# Patient Record
Sex: Female | Born: 1950 | Race: Black or African American | Hispanic: No | Smoking: Former smoker
Health system: Southern US, Community
[De-identification: ages and names within clinical notes are randomized; demographics above are authoritative.]

## PROBLEM LIST (undated history)

## (undated) DIAGNOSIS — E785 Hyperlipidemia, unspecified: Secondary | ICD-10-CM

## (undated) DIAGNOSIS — I10 Essential (primary) hypertension: Secondary | ICD-10-CM

## (undated) DIAGNOSIS — S0300XA Dislocation of jaw, unspecified side, initial encounter: Secondary | ICD-10-CM

## (undated) HISTORY — DX: Hyperlipidemia, unspecified: E78.5

## (undated) HISTORY — PX: CHOLECYSTECTOMY: SHX55

## (undated) HISTORY — PX: ABDOMINAL HYSTERECTOMY: SHX81

---

## 2002-01-18 ENCOUNTER — Emergency Department (HOSPITAL_COMMUNITY): Admission: EM | Admit: 2002-01-18 | Discharge: 2002-01-18 | Payer: Self-pay | Admitting: *Deleted

## 2002-03-17 ENCOUNTER — Inpatient Hospital Stay (HOSPITAL_COMMUNITY): Admission: EM | Admit: 2002-03-17 | Discharge: 2002-03-22 | Payer: Self-pay | Admitting: Emergency Medicine

## 2002-03-17 ENCOUNTER — Encounter: Payer: Self-pay | Admitting: Emergency Medicine

## 2002-03-18 ENCOUNTER — Encounter: Payer: Self-pay | Admitting: General Surgery

## 2011-06-29 ENCOUNTER — Encounter (HOSPITAL_COMMUNITY): Payer: Self-pay | Admitting: *Deleted

## 2011-06-29 ENCOUNTER — Emergency Department (HOSPITAL_COMMUNITY)
Admission: EM | Admit: 2011-06-29 | Discharge: 2011-06-29 | Disposition: A | Discharge status: Home / Self Care | Payer: Self-pay | Attending: Emergency Medicine | Admitting: Emergency Medicine

## 2011-06-29 DIAGNOSIS — M2669 Other specified disorders of temporomandibular joint: Secondary | ICD-10-CM

## 2011-06-29 DIAGNOSIS — M26609 Unspecified temporomandibular joint disorder, unspecified side: Secondary | ICD-10-CM | POA: Insufficient documentation

## 2011-06-29 HISTORY — DX: Dislocation of jaw, unspecified side, initial encounter: S03.00XA

## 2011-06-29 MED ORDER — DIAZEPAM 5 MG PO TABS: 5.0000 mg | ORAL_TABLET | Freq: Four times a day (QID) | ORAL | Status: AC | PRN

## 2011-06-29 MED ORDER — DIAZEPAM 5 MG PO TABS
ORAL_TABLET | ORAL | Status: AC
  Filled 2011-06-29: qty 4

## 2011-06-29 NOTE — ED Provider Notes (Signed)
History  This chart was scribed for Hurman Horn, MD by Cherlynn Perches. The patient was seen in room APA18/APA18. Patient's care was started at 1649.  CSN: [id number]  Arrival date & time 06/29/11  1649   First MD Initiated Contact with Patient 06/29/11 1944      Chief Complaint  Patient presents with  . Jaw Pain    (Consider location/radiation/quality/duration/timing/severity/associated sxs/prior treatment) HPI  Natasha Vazquez is a 61 y.o. 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 Pt reports that she has dislocated the TMJ joint 2 times previously. Pt states that she has been taking flexeril for 3 days with minimal relief. Shortly before being seen by physician, pt reports that the stiffness in her jaw was relieved. Pt denies smoking and alcohol use.No fever, trauma, tooth pain, neck pain, headache.   Past Medical History  Diagnosis Date  . TMJ (dislocation of temporomandibular joint)     Past Surgical History  Procedure Date  . Cholecystectomy   . Abdominal hysterectomy     No family history on file.  History  Substance Use Topics  . Smoking status: Never Smoker   . Smokeless tobacco: Not on file  . Alcohol Use: No    OB History    Grav Para Term Preterm Abortions TAB SAB Ect Mult Living                  Review of Systems  Constitutional: Negative for fever.       10 Systems reviewed and are negative for acute change except as noted in the HPI.  HENT: Negative for congestion.        TMJ pain, inability to close jaw, swelling of jaw  Eyes: Negative for discharge and redness.  Respiratory: Negative for cough and shortness of breath.   Cardiovascular: Negative for chest pain.  Gastrointestinal: Negative for vomiting and abdominal pain.  Musculoskeletal: Negative for back pain.  Skin: Negative for rash.  Neurological: Negative  for syncope, numbness and headaches.  Psychiatric/Behavioral:       No behavior change.    Allergies  Aspirin and Penicillins  Home Medications   Current Outpatient Rx  Name Route Sig Dispense Refill  . CYCLOBENZAPRINE HCL 10 MG PO TABS Oral Take 10 mg by mouth 2 (two) times daily as needed. Muscle spasm    . DIAZEPAM 5 MG PO TABS Oral Take 1 tablet (5 mg total) by mouth every 6 (six) hours as needed (spasms). 4 tablet 0    BP 149/83  Pulse 78  Temp 98.1 F (36.7 C)  Resp 20  Ht 5\' 9"  (1.753 m)  Wt 170 lb (77.111 kg)  BMI 25.10 kg/m2  SpO2 100%  Physical Exam  Nursing note and vitals reviewed. Constitutional:       Awake, alert, nontoxic appearance.  HENT:  Head: Atraumatic.  Mouth/Throat: Oropharynx is clear and moist.       Normal jaw occulusion, dentition intact, mild bilateral TMJ tenderness  Eyes: Right eye exhibits no discharge. Left eye exhibits no discharge.  Neck: Neck supple.  Cardiovascular: Normal rate, regular rhythm and normal heart sounds.   No murmur heard. Pulmonary/Chest: Effort normal and breath sounds normal. No respiratory distress. She has no wheezes. She has no rales. She exhibits no tenderness.  Abdominal: Soft. There is no tenderness. There is no rebound.  Musculoskeletal:  She exhibits no tenderness.       Baseline ROM, no obvious new focal weakness.  Neurological:       Mental status and motor strength appears baseline for patient and situation.  Skin: No rash noted.  Psychiatric: She has a normal mood and affect.    ED Course  Procedures (including critical care time)  DIAGNOSTIC STUDIES: Oxygen Saturation is 100% on room air, normal by my interpretation.    COORDINATION OF CARE: 8:00PM - Will prescribe muscle relaxer. Advised pt to follow up with dentist. Pt agrees with plan.    Labs Reviewed - No data to display No results found.   1. TMJ locking       MDM  I personally performed the services described in this  documentation, which was scribed in my presence. The recorded information has been reviewed and considered.  Pt stable in ED with no significant deterioration in condition.Patient / Family / Caregiver informed of clinical course, understand medical decision-making process, and agree with plan.I doubt any other EMC precluding discharge at this time including, but not necessarily limited to the following:SBI, dislocated jaw.  Hurman Horn, MD 06/30/11 [phone]

## 2011-06-29 NOTE — Discharge Instructions (Signed)
No yawning, chewing, laughing, or wide mouth opening. Liquid or soft diet only with no chewing until recheck.  Return sooner if you develop fever, inability to close teeth together, recurrent severe pain, difficulty breathing or swallowing, neck swelling, or other concerns.

## 2011-06-29 NOTE — ED Notes (Signed)
Pt with swelling to right jaw, states this is third time

## 2011-06-29 NOTE — ED Notes (Signed)
Jaw locked, history of TMJ

## 2014-03-01 ENCOUNTER — Other Ambulatory Visit (HOSPITAL_COMMUNITY): Payer: Self-pay | Admitting: Family Medicine

## 2014-03-01 DIAGNOSIS — R030 Elevated blood-pressure reading, without diagnosis of hypertension: Secondary | ICD-10-CM | POA: Diagnosis not present

## 2014-03-01 DIAGNOSIS — Z1231 Encounter for screening mammogram for malignant neoplasm of breast: Secondary | ICD-10-CM

## 2014-03-01 DIAGNOSIS — Z13220 Encounter for screening for lipoid disorders: Secondary | ICD-10-CM | POA: Diagnosis not present

## 2014-03-01 DIAGNOSIS — Z136 Encounter for screening for cardiovascular disorders: Secondary | ICD-10-CM | POA: Diagnosis not present

## 2014-03-01 DIAGNOSIS — Z0000 Encounter for general adult medical examination without abnormal findings: Secondary | ICD-10-CM | POA: Diagnosis not present

## 2014-03-01 DIAGNOSIS — Z23 Encounter for immunization: Secondary | ICD-10-CM | POA: Diagnosis not present

## 2014-03-01 DIAGNOSIS — H547 Unspecified visual loss: Secondary | ICD-10-CM | POA: Diagnosis not present

## 2014-03-01 DIAGNOSIS — D179 Benign lipomatous neoplasm, unspecified: Secondary | ICD-10-CM | POA: Diagnosis not present

## 2014-03-01 DIAGNOSIS — Z1389 Encounter for screening for other disorder: Secondary | ICD-10-CM | POA: Diagnosis not present

## 2014-03-15 ENCOUNTER — Ambulatory Visit (HOSPITAL_COMMUNITY)
Admission: RE | Admit: 2014-03-15 | Discharge: 2014-03-15 | Disposition: A | Discharge status: Home / Self Care | Payer: Medicare Other | Source: Ambulatory Visit | Attending: Family Medicine | Admitting: Family Medicine

## 2014-03-15 DIAGNOSIS — Z1231 Encounter for screening mammogram for malignant neoplasm of breast: Secondary | ICD-10-CM | POA: Diagnosis not present

## 2014-05-10 DIAGNOSIS — H3532 Exudative age-related macular degeneration: Secondary | ICD-10-CM | POA: Diagnosis not present

## 2014-05-10 DIAGNOSIS — H524 Presbyopia: Secondary | ICD-10-CM | POA: Diagnosis not present

## 2014-05-10 DIAGNOSIS — H2513 Age-related nuclear cataract, bilateral: Secondary | ICD-10-CM | POA: Diagnosis not present

## 2014-05-10 DIAGNOSIS — H3531 Nonexudative age-related macular degeneration: Secondary | ICD-10-CM | POA: Diagnosis not present

## 2014-06-02 DIAGNOSIS — H43812 Vitreous degeneration, left eye: Secondary | ICD-10-CM | POA: Diagnosis not present

## 2014-06-02 DIAGNOSIS — H3531 Nonexudative age-related macular degeneration: Secondary | ICD-10-CM | POA: Diagnosis not present

## 2014-08-31 DIAGNOSIS — H01005 Unspecified blepharitis left lower eyelid: Secondary | ICD-10-CM | POA: Diagnosis not present

## 2014-08-31 DIAGNOSIS — H01004 Unspecified blepharitis left upper eyelid: Secondary | ICD-10-CM | POA: Diagnosis not present

## 2014-08-31 DIAGNOSIS — H01001 Unspecified blepharitis right upper eyelid: Secondary | ICD-10-CM | POA: Diagnosis not present

## 2014-08-31 DIAGNOSIS — H25813 Combined forms of age-related cataract, bilateral: Secondary | ICD-10-CM | POA: Diagnosis not present

## 2014-08-31 DIAGNOSIS — H01002 Unspecified blepharitis right lower eyelid: Secondary | ICD-10-CM | POA: Diagnosis not present

## 2014-08-31 DIAGNOSIS — H5332 Fusion with defective stereopsis: Secondary | ICD-10-CM | POA: Diagnosis not present

## 2014-08-31 DIAGNOSIS — H524 Presbyopia: Secondary | ICD-10-CM | POA: Diagnosis not present

## 2015-03-06 ENCOUNTER — Other Ambulatory Visit (HOSPITAL_COMMUNITY): Payer: Self-pay | Admitting: Family Medicine

## 2015-03-06 DIAGNOSIS — Z1231 Encounter for screening mammogram for malignant neoplasm of breast: Secondary | ICD-10-CM

## 2015-03-26 ENCOUNTER — Ambulatory Visit (HOSPITAL_COMMUNITY)
Admission: RE | Admit: 2015-03-26 | Discharge: 2015-03-26 | Disposition: A | Discharge status: Home / Self Care | Payer: Medicare Other | Source: Ambulatory Visit | Attending: Family Medicine | Admitting: Family Medicine

## 2015-03-26 DIAGNOSIS — Z1231 Encounter for screening mammogram for malignant neoplasm of breast: Secondary | ICD-10-CM | POA: Insufficient documentation

## 2016-02-25 ENCOUNTER — Other Ambulatory Visit (HOSPITAL_COMMUNITY): Payer: Self-pay | Admitting: Family Medicine

## 2016-02-25 ENCOUNTER — Ambulatory Visit (HOSPITAL_COMMUNITY)
Admission: RE | Admit: 2016-02-25 | Discharge: 2016-02-25 | Disposition: A | Discharge status: Home / Self Care | Payer: Medicare Other | Source: Ambulatory Visit | Attending: Family Medicine | Admitting: Family Medicine

## 2016-02-25 DIAGNOSIS — Z1231 Encounter for screening mammogram for malignant neoplasm of breast: Secondary | ICD-10-CM

## 2016-04-02 ENCOUNTER — Ambulatory Visit (HOSPITAL_COMMUNITY)
Admission: RE | Admit: 2016-04-02 | Discharge: 2016-04-02 | Disposition: A | Discharge status: Home / Self Care | Payer: Medicare Other | Source: Ambulatory Visit | Attending: Family Medicine | Admitting: Family Medicine

## 2016-04-02 DIAGNOSIS — Z1231 Encounter for screening mammogram for malignant neoplasm of breast: Secondary | ICD-10-CM | POA: Diagnosis present

## 2016-08-12 ENCOUNTER — Encounter (HOSPITAL_COMMUNITY): Payer: Self-pay | Admitting: Emergency Medicine

## 2016-08-12 ENCOUNTER — Emergency Department (HOSPITAL_COMMUNITY)
Admission: EM | Admit: 2016-08-12 | Discharge: 2016-08-12 | Disposition: A | Discharge status: Home / Self Care | Payer: Medicare Other | Attending: Emergency Medicine | Admitting: Emergency Medicine

## 2016-08-12 ENCOUNTER — Emergency Department (HOSPITAL_COMMUNITY): Payer: Medicare Other

## 2016-08-12 DIAGNOSIS — R42 Dizziness and giddiness: Secondary | ICD-10-CM | POA: Insufficient documentation

## 2016-08-12 DIAGNOSIS — I10 Essential (primary) hypertension: Secondary | ICD-10-CM | POA: Diagnosis not present

## 2016-08-12 HISTORY — DX: Essential (primary) hypertension: I10

## 2016-08-12 LAB — COMPREHENSIVE METABOLIC PANEL
ALBUMIN: 4.1 g/dL (ref 3.5–5.0)
ALK PHOS: 87 U/L (ref 38–126)
ALT: 27 U/L (ref 14–54)
ANION GAP: 7 (ref 5–15)
AST: 36 U/L (ref 15–41)
BUN: 19 mg/dL (ref 6–20)
CO2: 24 mmol/L (ref 22–32)
CREATININE: 0.68 mg/dL (ref 0.44–1.00)
Calcium: 9.2 mg/dL (ref 8.9–10.3)
Chloride: 108 mmol/L (ref 101–111)
GFR calc Af Amer: >60 mL/min (ref >60)
GFR calc non Af Amer: >60 mL/min (ref >60)
GLUCOSE: 134 mg/dL — AB (ref 65–99)
Potassium: 4.2 mmol/L (ref 3.5–5.1)
SODIUM: 139 mmol/L (ref 135–145)
Total Bilirubin: 0.5 mg/dL (ref 0.3–1.2)
Total Protein: 7.4 g/dL (ref 6.5–8.1)

## 2016-08-12 LAB — CBC WITH DIFFERENTIAL/PLATELET
BASOS ABS: 0 10*3/uL (ref 0.0–0.1)
BASOS PCT: 0 %
Eosinophils Absolute: 0 10*3/uL (ref 0.0–0.7)
Eosinophils Relative: 0 %
HCT: 36.1 % (ref 36.0–46.0)
Hemoglobin: 12.5 g/dL (ref 12.0–15.0)
Lymphocytes Relative: 11 %
Lymphs Abs: 0.9 10*3/uL (ref 0.7–4.0)
MCH: 32.6 pg (ref 26.0–34.0)
MCHC: 34.6 g/dL (ref 30.0–36.0)
MCV: 94.3 fL (ref 78.0–100.0)
Monocytes Absolute: 0.2 10*3/uL (ref 0.1–1.0)
Monocytes Relative: 2 %
NEUTROS PCT: 87 %
Neutro Abs: 7.2 10*3/uL (ref 1.7–7.7)
Platelets: 295 10*3/uL (ref 150–400)
RBC: 3.83 MIL/uL — AB (ref 3.87–5.11)
RDW: 13 % (ref 11.5–15.5)
WBC: 8.2 10*3/uL (ref 4.0–10.5)

## 2016-08-12 MED ORDER — PROMETHAZINE HCL 25 MG/ML IJ SOLN
12.5000 mg | Freq: Once | INTRAMUSCULAR | Status: AC
  Administered 2016-08-12: 12.5 mg via INTRAVENOUS
  Filled 2016-08-12: qty 1

## 2016-08-12 MED ORDER — ONDANSETRON 4 MG PO TBDP: ORAL_TABLET | ORAL | 0 refills | Status: DC

## 2016-08-12 MED ORDER — ONDANSETRON HCL 4 MG/2ML IJ SOLN
4.0000 mg | Freq: Once | INTRAMUSCULAR | Status: AC
  Administered 2016-08-12: 4 mg via INTRAVENOUS
  Filled 2016-08-12: qty 2

## 2016-08-12 MED ORDER — DIAZEPAM 5 MG PO TABS
5.0000 mg | ORAL_TABLET | Freq: Once | ORAL | Status: AC
  Administered 2016-08-12: 5 mg via ORAL
  Filled 2016-08-12: qty 1

## 2016-08-12 MED ORDER — MECLIZINE HCL 12.5 MG PO TABS
25.0000 mg | ORAL_TABLET | Freq: Once | ORAL | Status: AC
  Administered 2016-08-12: 25 mg via ORAL
  Filled 2016-08-12: qty 2

## 2016-08-12 MED ORDER — SODIUM CHLORIDE 0.9 % IV BOLUS (SEPSIS)
1000.0000 mL | Freq: Once | INTRAVENOUS | Status: AC
  Administered 2016-08-12: 1000 mL via INTRAVENOUS

## 2016-08-12 MED ORDER — MECLIZINE HCL 25 MG PO TABS: 25.0000 mg | ORAL_TABLET | Freq: Three times a day (TID) | ORAL | 0 refills | Status: DC | PRN

## 2016-08-12 NOTE — ED Provider Notes (Signed)
The patient's dizziness improved somewhat treatment. MRI showed possible very small aneurysm. I spoke with neurosurgery and it was recommended that they set her up for a arteriogram. Neurosurgery will call the patient and tell them when the arteriogram will be done. Even if she has this 2.5 mm aneurysm the treatment will be observation with neurosurgery   Milton Ferguson, MD 08/12/16 1114

## 2016-08-12 NOTE — ED Provider Notes (Signed)
[facility] DEPT Provider Note   CSN: [id number] Arrival date & time: 08/12/16  0351     History   Chief Complaint Chief Complaint  Patient presents with  . Dizziness    HPI Natasha Vazquez is a 66 y.o. female.  HPI  This is a 66 year old female with a history of hypertension who presents with dizziness. Patient states that she woke up this morning at 2 AM to go to the bathroom. When she laid back down she states that the room started to spin. She had 2 episodes of nonbilious, nonbloody emesis. Room spinning is worse with position changes and head movement. Denies weakness, numbness, tingling, vision changes. No other focal deficits. No history of vertigo. No recent illnesses or upper respiratory symptoms.  Past Medical History:  Diagnosis Date  . Hypertension   . TMJ (dislocation of temporomandibular joint)     There are no active problems to display for this patient.   Past Surgical History:  Procedure Laterality Date  . ABDOMINAL HYSTERECTOMY    . CHOLECYSTECTOMY      OB History    No data available       Home Medications    Prior to Admission medications   Medication Sig Start Date End Date Taking? Authorizing Provider  cyclobenzaprine (FLEXERIL) 10 MG tablet Take 10 mg by mouth 2 (two) times daily as needed. Muscle spasm    [provider]    Family History History reviewed. No pertinent family history.  Social History Social History  Substance Use Topics  . Smoking status: Never Smoker  . Smokeless tobacco: Never Used  . Alcohol use No     Allergies   Aspirin and Penicillins   Review of Systems Review of Systems  Constitutional: Negative for fever.  Respiratory: Negative for shortness of breath.   Cardiovascular: Negative for chest pain.  Gastrointestinal: Positive for abdominal pain, nausea and vomiting.  Neurological: Positive for dizziness and light-headedness. Negative for speech difficulty, weakness and numbness.  All  other systems reviewed and are negative.    Physical Exam Updated Vital Signs BP (!) 162/79   Pulse 69   Temp 98.1 F (36.7 C) (Oral)   Resp 16   Ht 5\' 7"  (1.702 m)   Wt 75.8 kg (167 lb)   SpO2 94%   BMI 26.16 kg/m   Physical Exam  Constitutional: She is oriented to person, place, and time.  Uncomfortable appearing, eyes closed, no acute distress  HENT:  Head: Normocephalic and atraumatic.  Eyes: Pupils are equal, round, and reactive to light.  Horizontal nystagmus noted, difficulty with extraocular movements secondary to severe nausea  Cardiovascular: Normal rate, regular rhythm and normal heart sounds.   No murmur heard. Pulmonary/Chest: Effort normal and breath sounds normal. No respiratory distress. She has no wheezes.  Abdominal: Soft. There is no tenderness.  Neurological: She is alert and oriented to person, place, and time.  Cranial nerves II through XII intact, no dysmetria to finger-nose-finger, gait testing deferred, 5 out of 5 strength in all 4 extremities  Skin: Skin is warm and dry.  Psychiatric: She has a normal mood and affect.  Nursing note and vitals reviewed.    ED Treatments / Results  Labs (all labs ordered are listed, but only abnormal results are displayed) Labs Reviewed - No data to display  EKG  EKG Interpretation  Date/Time:  Tuesday August 12 2016 04:00:18 EDT Ventricular Rate:  68 PR Interval:    QRS Duration: 99 QT Interval:  407 QTC Calculation: 433 R Axis:   57 Text Interpretation:  Sinus rhythm Baseline wander in lead(s) V4 Confirmed by Thayer Jew [phone]) on 08/12/2016 4:05:29 AM       Radiology No results found.  Procedures Procedures (including critical care time)  Medications Ordered in ED Medications  meclizine (ANTIVERT) tablet 25 mg (25 mg Oral Given 08/12/16 0442)  diazepam (VALIUM) tablet 5 mg (5 mg Oral Given 08/12/16 0442)  sodium chloride 0.9 % bolus 1,000 mL (1,000 mLs Intravenous New Bag/Given 08/12/16  0441)  ondansetron (ZOFRAN) injection 4 mg (4 mg Intravenous Given 08/12/16 0441)     Initial Impression / Assessment and Plan / ED Course  I have reviewed the triage vital signs and the nursing notes.  Pertinent labs & imaging results that were available during my care of the patient were reviewed by me and considered in my medical decision making (see chart for details).  Clinical Course as of Aug 12 612  Tue Aug 12, 2016  1595 Patient with improvement of symptoms. Remains sleepy. She is able now to move her head but does report minimal residual symptoms.  [CH]  3967 Still with some residual persistent dizziness. Is unable to sit up for further neuro testing or ambulation. For this reason and given risk factors and age, will obtain MRI to rule out cerebellar stroke.  [CH]    Clinical Course User Index [CH] Horton, Barbette Hair, MD    Patient presents with vertiginous symptoms. Acute onset. No history of the same. Given vomiting and worsening with position changes and head movement as well as nystagmus, suspect peripheral vertigo. However, patient has a history of hypertension and she is 65 years old. She is certainly at risk for CVA. Patient was given fluids, meclizine, Valium. She has had some improvement of her symptoms and is resting comfortably but does have residual symptoms with position changes. It is difficult to fully assess neurologic status as patient was initially unable to fully cooperate. She still is not symptom controlled enough for gait testing., Will obtain MRI to rule out CVA.  Final Clinical Impressions(s) / ED Diagnoses   Final diagnoses:  Vertigo    New Prescriptions New Prescriptions   No medications on file     Merryl Hacker, MD 08/12/16 [phone]

## 2016-08-12 NOTE — Discharge Instructions (Signed)
Follow-up with your family doctor this week for recheck. Rest and drink plenty of fluids. He will receive a call from Dr. Cleotilde Neer office about setting up a special x-ray to look at whether you have a very small aneurysm in your brain. Even if you have this very small aneurysm the treatment will be observation for now

## 2016-08-12 NOTE — ED Notes (Signed)
Pt being transported to MRI.

## 2016-08-12 NOTE — ED Triage Notes (Signed)
Patient went to bathroom around 2 am this morning on the way back to bed start having dizzy spell, nauseated and vomited x2.

## 2016-08-12 NOTE — ED Notes (Signed)
Family and doctor at bedside

## 2016-08-25 ENCOUNTER — Other Ambulatory Visit: Payer: Self-pay | Admitting: Neurosurgery

## 2016-08-25 DIAGNOSIS — I671 Cerebral aneurysm, nonruptured: Secondary | ICD-10-CM

## 2016-09-16 ENCOUNTER — Encounter (HOSPITAL_COMMUNITY): Payer: Self-pay | Admitting: Neurosurgery

## 2016-09-16 ENCOUNTER — Other Ambulatory Visit: Payer: Self-pay | Admitting: Neurosurgery

## 2016-09-16 ENCOUNTER — Ambulatory Visit (HOSPITAL_COMMUNITY)
Admission: RE | Admit: 2016-09-16 | Discharge: 2016-09-16 | Disposition: A | Discharge status: Home / Self Care | Payer: Medicare Other | Source: Ambulatory Visit | Attending: Neurosurgery | Admitting: Neurosurgery

## 2016-09-16 DIAGNOSIS — I10 Essential (primary) hypertension: Secondary | ICD-10-CM | POA: Diagnosis not present

## 2016-09-16 DIAGNOSIS — I671 Cerebral aneurysm, nonruptured: Secondary | ICD-10-CM | POA: Insufficient documentation

## 2016-09-16 DIAGNOSIS — Z880 Allergy status to penicillin: Secondary | ICD-10-CM | POA: Insufficient documentation

## 2016-09-16 HISTORY — PX: IR ANGIO INTRA EXTRACRAN SEL INTERNAL CAROTID BILAT MOD SED: IMG5363

## 2016-09-16 HISTORY — PX: IR ANGIO VERTEBRAL SEL VERTEBRAL BILAT MOD SED: IMG5369

## 2016-09-16 LAB — CBC WITH DIFFERENTIAL/PLATELET
BASOS ABS: 0 10*3/uL (ref 0.0–0.1)
BASOS PCT: 1 %
EOS ABS: 0 10*3/uL (ref 0.0–0.7)
EOS PCT: 1 %
HEMATOCRIT: 37.8 % (ref 36.0–46.0)
HEMOGLOBIN: 13 g/dL (ref 12.0–15.0)
Lymphocytes Relative: 54 %
Lymphs Abs: 2.4 10*3/uL (ref 0.7–4.0)
MCH: 32.3 pg (ref 26.0–34.0)
MCHC: 34.4 g/dL (ref 30.0–36.0)
MCV: 93.8 fL (ref 78.0–100.0)
MONO ABS: 0.3 10*3/uL (ref 0.1–1.0)
Monocytes Relative: 8 %
NEUTROS ABS: 1.7 10*3/uL (ref 1.7–7.7)
Neutrophils Relative %: 38 %
Platelets: 305 10*3/uL (ref 150–400)
RBC: 4.03 MIL/uL (ref 3.87–5.11)
RDW: 12.8 % (ref 11.5–15.5)
WBC: 4.4 10*3/uL (ref 4.0–10.5)

## 2016-09-16 LAB — BASIC METABOLIC PANEL
ANION GAP: 11 (ref 5–15)
BUN: 14 mg/dL (ref 6–20)
CALCIUM: 9.3 mg/dL (ref 8.9–10.3)
CO2: 21 mmol/L — ABNORMAL LOW (ref 22–32)
Chloride: 107 mmol/L (ref 101–111)
Creatinine, Ser: 0.77 mg/dL (ref 0.44–1.00)
GFR calc Af Amer: >60 mL/min (ref >60)
GLUCOSE: 114 mg/dL — AB (ref 65–99)
Potassium: 3.6 mmol/L (ref 3.5–5.1)
Sodium: 139 mmol/L (ref 135–145)

## 2016-09-16 LAB — PROTIME-INR
INR: 0.99
Prothrombin Time: 13.1 seconds (ref 11.4–15.2)

## 2016-09-16 LAB — APTT: APTT: 33 s (ref 24–36)

## 2016-09-16 MED ORDER — SODIUM CHLORIDE 0.9 % IV SOLN: INTRAVENOUS | Status: DC

## 2016-09-16 MED ORDER — HEPARIN SODIUM (PORCINE) 1000 UNIT/ML IJ SOLN
INTRAMUSCULAR | Status: AC
  Filled 2016-09-16: qty 1

## 2016-09-16 MED ORDER — HYDROCODONE-ACETAMINOPHEN 5-325 MG PO TABS: 1.0000 | ORAL_TABLET | ORAL | Status: DC | PRN

## 2016-09-16 MED ORDER — FENTANYL CITRATE (PF) 100 MCG/2ML IJ SOLN
INTRAMUSCULAR | Status: AC
  Filled 2016-09-16: qty 2

## 2016-09-16 MED ORDER — LORAZEPAM 2 MG/ML IJ SOLN
INTRAMUSCULAR | Status: AC | PRN
  Administered 2016-09-16: 1 mg via INTRAVENOUS

## 2016-09-16 MED ORDER — IOPAMIDOL (ISOVUE-300) INJECTION 61%
INTRAVENOUS | Status: AC
  Filled 2016-09-16: qty 100

## 2016-09-16 MED ORDER — SODIUM CHLORIDE 0.9 % IV SOLN
INTRAVENOUS | Status: DC
  Administered 2016-09-16: 10:00:00 via INTRAVENOUS

## 2016-09-16 MED ORDER — VANCOMYCIN HCL IN DEXTROSE 1-5 GM/200ML-% IV SOLN: 1000.0000 mg | INTRAVENOUS | Status: DC

## 2016-09-16 MED ORDER — HEPARIN SODIUM (PORCINE) 1000 UNIT/ML IJ SOLN
INTRAMUSCULAR | Status: AC | PRN
  Administered 2016-09-16: 2000 [IU] via INTRAVENOUS

## 2016-09-16 MED ORDER — FENTANYL CITRATE (PF) 100 MCG/2ML IJ SOLN
INTRAMUSCULAR | Status: AC | PRN
  Administered 2016-09-16: 25 ug via INTRAVENOUS

## 2016-09-16 MED ORDER — LIDOCAINE HCL (PF) 1 % IJ SOLN
INTRAMUSCULAR | Status: AC
  Filled 2016-09-16: qty 30

## 2016-09-16 MED ORDER — MIDAZOLAM HCL 2 MG/2ML IJ SOLN
INTRAMUSCULAR | Status: AC
  Filled 2016-09-16: qty 2

## 2016-09-16 NOTE — Sedation Documentation (Signed)
IR to place exoseal to R groin and hold pressure

## 2016-09-16 NOTE — Sedation Documentation (Signed)
Patient is resting comfortably. 

## 2016-09-16 NOTE — Progress Notes (Signed)
Called and spoke with Dr Kathyrn Sheriff to verify orders, dc UA and add fluids.

## 2016-09-16 NOTE — Sedation Documentation (Signed)
Pressure still held to R groin

## 2016-09-16 NOTE — Sedation Documentation (Signed)
MD Provider at bedside. 

## 2016-09-16 NOTE — Sedation Documentation (Signed)
Pressure to R groin in process

## 2016-09-16 NOTE — Discharge Instructions (Signed)

## 2016-09-16 NOTE — H&P (Signed)
Chief Complaint   Aneurysm  History of Present Illness  Natasha Vazquez is a 66 year old woman I am seeing for the above, referred for recent incidental diagnosis of intracranial aneurysm. A few weeks ago she had sudden onset of vertigo and dizziness for which she was seen in the emergency department. MRI scan was done which did demonstrate possibility of a left middle cerebral artery aneurysm. She was therefore referred for neurosurgical evaluation. She comes in today reporting significant improvement in the vertigo, although she does continue to have some dizziness which causes her to be a little bit off balance. She does not have any changes in her vision, or numbness tingling or weakness of the extremities. She is not a smoker, has controlled hypertension, and there is no family history of intracranial aneurysms or intracranial hemorrhage  Past Medical History   Past Medical History:  Diagnosis Date  . Hypertension   . TMJ (dislocation of temporomandibular joint)     Past Surgical History   Past Surgical History:  Procedure Laterality Date  . ABDOMINAL HYSTERECTOMY    . CHOLECYSTECTOMY      Social History   Social History  Substance Use Topics  . Smoking status: Never Smoker  . Smokeless tobacco: Never Used  . Alcohol use No    Medications   Prior to Admission medications   Medication Sig Start Date End Date Taking? Authorizing Provider  losartan (COZAAR) 100 MG tablet Take 100 mg by mouth daily 05/15/16  Yes [provider]  meclizine (ANTIVERT) 25 MG tablet Take 1 tablet (25 mg total) by mouth 3 (three) times daily as needed for dizziness. 08/12/16  Yes Milton Ferguson, MD  Multiple Vitamins-Minerals (PRESERVISION AREDS 2 PO) Take 2 capsules by mouth daily.   Yes [provider]  Omega-3 Fatty Acids (FISH OIL) 1000 MG CAPS Take 1,000 mg by mouth daily.    Yes [provider]  Polyethyl Glycol-Propyl Glycol (SYSTANE) 0.4-0.3 % GEL ophthalmic gel  Place 2 application into both eyes daily.   Yes [provider]  ondansetron (ZOFRAN ODT) 4 MG disintegrating tablet 4mg  ODT q4 hours prn nausea/vomit Patient not taking: Reported on 09/16/2016 08/12/16   Milton Ferguson, MD    Allergies   Allergies  Allergen Reactions  . Aspirin Other (See Comments)    Had a GI bleed  . Penicillins Rash and Other (See Comments)    Has patient had a PCN reaction causing immediate rash, facial/tongue/throat swelling, SOB or lightheadedness with hypotension: Yes Has patient had a PCN reaction causing severe rash involving mucus membranes or skin necrosis: No Has patient had a PCN reaction that required hospitalization: Unknown Has patient had a PCN reaction occurring within the last 10 years: No If all of the above answers are "NO", then may proceed with Cephalosporin use.     Review of Systems  ROS  Neurologic Exam  Awake, alert, oriented Memory and concentration grossly intact Speech fluent, appropriate CN grossly intact Motor exam: Upper Extremities Deltoid Bicep Tricep Grip  Right 5/5 5/5 5/5 5/5  Left 5/5 5/5 5/5 5/5   Lower Extremities IP Quad PF DF EHL  Right 5/5 5/5 5/5 5/5 5/5  Left 5/5 5/5 5/5 5/5 5/5   Sensation grossly intact to LT  Imaging  MRA of the brain was reviewed. 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 Certainly, this would not be responsible for  her presenting symptom of vertigo.  Plan  We will plan on proceeding with further workup with diagnostic cerebral angiogram  A comprehensive discussion was had with the patient and family in the office. The risks of the angiogram procedure were reviewed, including a 0.1% risk of stroke, and overal risk of approximately 1% including but not limited to groin hematoma, headache, contrast reaction, and  nephropathy.  The patient understood our discussion and is willing to proceed as above.

## 2016-10-30 ENCOUNTER — Telehealth (HOSPITAL_COMMUNITY): Payer: Self-pay | Admitting: Family Medicine

## 2016-10-30 NOTE — Telephone Encounter (Signed)
10/30/16  Daughter called to cx appt

## 2016-10-31 ENCOUNTER — Ambulatory Visit (HOSPITAL_COMMUNITY): Payer: Medicare Other | Admitting: Physical Therapy

## 2017-03-05 ENCOUNTER — Other Ambulatory Visit (HOSPITAL_COMMUNITY): Payer: Self-pay | Admitting: Family Medicine

## 2017-03-05 DIAGNOSIS — Z1231 Encounter for screening mammogram for malignant neoplasm of breast: Secondary | ICD-10-CM

## 2017-04-15 ENCOUNTER — Ambulatory Visit (HOSPITAL_COMMUNITY)
Admission: RE | Admit: 2017-04-15 | Discharge: 2017-04-15 | Disposition: A | Discharge status: Home / Self Care | Payer: Medicare Other | Source: Ambulatory Visit | Attending: Family Medicine | Admitting: Family Medicine

## 2017-04-15 ENCOUNTER — Encounter (HOSPITAL_COMMUNITY): Payer: Self-pay

## 2017-04-15 DIAGNOSIS — Z1231 Encounter for screening mammogram for malignant neoplasm of breast: Secondary | ICD-10-CM

## 2017-07-07 ENCOUNTER — Ambulatory Visit (HOSPITAL_COMMUNITY)
Admission: RE | Admit: 2017-07-07 | Discharge: 2017-07-07 | Disposition: A | Discharge status: Home / Self Care | Payer: Medicare Other | Source: Ambulatory Visit | Attending: Family Medicine | Admitting: Family Medicine

## 2017-07-07 ENCOUNTER — Other Ambulatory Visit (HOSPITAL_COMMUNITY): Payer: Self-pay | Admitting: Family Medicine

## 2017-07-07 DIAGNOSIS — M25552 Pain in left hip: Secondary | ICD-10-CM

## 2018-06-16 ENCOUNTER — Other Ambulatory Visit (HOSPITAL_COMMUNITY): Payer: Self-pay | Admitting: Family Medicine

## 2018-06-16 DIAGNOSIS — Z1231 Encounter for screening mammogram for malignant neoplasm of breast: Secondary | ICD-10-CM

## 2018-07-07 ENCOUNTER — Other Ambulatory Visit: Payer: Self-pay

## 2018-07-07 ENCOUNTER — Ambulatory Visit (HOSPITAL_COMMUNITY)
Admission: RE | Admit: 2018-07-07 | Discharge: 2018-07-07 | Disposition: A | Discharge status: Home / Self Care | Payer: Medicare Other | Source: Ambulatory Visit | Attending: Family Medicine | Admitting: Family Medicine

## 2018-07-07 DIAGNOSIS — Z1231 Encounter for screening mammogram for malignant neoplasm of breast: Secondary | ICD-10-CM | POA: Insufficient documentation

## 2019-11-10 ENCOUNTER — Other Ambulatory Visit (HOSPITAL_COMMUNITY): Payer: Self-pay | Admitting: Family Medicine

## 2019-11-10 DIAGNOSIS — Z1231 Encounter for screening mammogram for malignant neoplasm of breast: Secondary | ICD-10-CM

## 2019-11-21 ENCOUNTER — Ambulatory Visit (HOSPITAL_COMMUNITY)
Admission: RE | Admit: 2019-11-21 | Discharge: 2019-11-21 | Disposition: A | Discharge status: Home / Self Care | Payer: Medicare Other | Source: Ambulatory Visit | Attending: Family Medicine | Admitting: Family Medicine

## 2019-11-21 ENCOUNTER — Other Ambulatory Visit: Payer: Self-pay

## 2019-11-21 DIAGNOSIS — Z1231 Encounter for screening mammogram for malignant neoplasm of breast: Secondary | ICD-10-CM | POA: Diagnosis present

## 2020-01-26 ENCOUNTER — Ambulatory Visit: Payer: Self-pay | Admitting: Surgery

## 2020-02-07 ENCOUNTER — Other Ambulatory Visit (HOSPITAL_COMMUNITY)
Admission: RE | Admit: 2020-02-07 | Discharge: 2020-02-07 | Disposition: A | Discharge status: Home / Self Care | Payer: Medicare Other | Source: Ambulatory Visit | Attending: Surgery | Admitting: Surgery

## 2020-02-07 DIAGNOSIS — Z01812 Encounter for preprocedural laboratory examination: Secondary | ICD-10-CM | POA: Diagnosis not present

## 2020-02-07 DIAGNOSIS — Z20822 Contact with and (suspected) exposure to covid-19: Secondary | ICD-10-CM | POA: Diagnosis not present

## 2020-02-08 LAB — SARS CORONAVIRUS 2 (TAT 6-24 HRS): SARS Coronavirus 2: NEGATIVE

## 2020-02-09 ENCOUNTER — Other Ambulatory Visit: Payer: Self-pay

## 2020-02-09 ENCOUNTER — Encounter (HOSPITAL_BASED_OUTPATIENT_CLINIC_OR_DEPARTMENT_OTHER): Payer: Self-pay | Admitting: Surgery

## 2020-02-10 ENCOUNTER — Encounter (HOSPITAL_BASED_OUTPATIENT_CLINIC_OR_DEPARTMENT_OTHER): Payer: Self-pay | Admitting: Surgery

## 2020-02-10 ENCOUNTER — Other Ambulatory Visit: Payer: Self-pay

## 2020-02-10 ENCOUNTER — Ambulatory Visit (HOSPITAL_BASED_OUTPATIENT_CLINIC_OR_DEPARTMENT_OTHER)
Admission: RE | Admit: 2020-02-10 | Discharge: 2020-02-10 | Disposition: A | Discharge status: Home / Self Care | Payer: Medicare Other | Attending: Surgery | Admitting: Surgery

## 2020-02-10 ENCOUNTER — Encounter (HOSPITAL_BASED_OUTPATIENT_CLINIC_OR_DEPARTMENT_OTHER)
Admission: RE | Disposition: A | Discharge status: Home / Self Care | Payer: Self-pay | Source: Home / Self Care | Attending: Surgery

## 2020-02-10 ENCOUNTER — Ambulatory Visit (HOSPITAL_BASED_OUTPATIENT_CLINIC_OR_DEPARTMENT_OTHER): Payer: Medicare Other | Admitting: Anesthesiology

## 2020-02-10 DIAGNOSIS — D171 Benign lipomatous neoplasm of skin and subcutaneous tissue of trunk: Secondary | ICD-10-CM | POA: Diagnosis not present

## 2020-02-10 DIAGNOSIS — Z886 Allergy status to analgesic agent status: Secondary | ICD-10-CM | POA: Insufficient documentation

## 2020-02-10 DIAGNOSIS — I10 Essential (primary) hypertension: Secondary | ICD-10-CM | POA: Diagnosis not present

## 2020-02-10 DIAGNOSIS — R222 Localized swelling, mass and lump, trunk: Secondary | ICD-10-CM | POA: Diagnosis not present

## 2020-02-10 DIAGNOSIS — Z880 Allergy status to penicillin: Secondary | ICD-10-CM | POA: Insufficient documentation

## 2020-02-10 HISTORY — PX: MASS EXCISION: SHX2000

## 2020-02-10 SURGERY — EXCISION MASS
Anesthesia: General | Site: Back | Laterality: Left

## 2020-02-10 MED ORDER — CHLORHEXIDINE GLUCONATE CLOTH 2 % EX PADS: 6.0000 | MEDICATED_PAD | Freq: Once | CUTANEOUS | Status: DC

## 2020-02-10 MED ORDER — CEFAZOLIN SODIUM-DEXTROSE 2-4 GM/100ML-% IV SOLN
INTRAVENOUS | Status: AC
  Filled 2020-02-10: qty 100

## 2020-02-10 MED ORDER — DOCUSATE SODIUM 100 MG PO CAPS: 100.0000 mg | ORAL_CAPSULE | Freq: Two times a day (BID) | ORAL | 2 refills | Status: AC

## 2020-02-10 MED ORDER — PROPOFOL 10 MG/ML IV BOLUS
INTRAVENOUS | Status: DC | PRN
  Administered 2020-02-10: 150 mg via INTRAVENOUS

## 2020-02-10 MED ORDER — LACTATED RINGERS IV SOLN: INTRAVENOUS | Status: DC

## 2020-02-10 MED ORDER — DEXAMETHASONE SODIUM PHOSPHATE 4 MG/ML IJ SOLN
INTRAMUSCULAR | Status: DC | PRN
  Administered 2020-02-10: 5 mg via INTRAVENOUS

## 2020-02-10 MED ORDER — OXYCODONE HCL 5 MG PO TABS: 5.0000 mg | ORAL_TABLET | Freq: Once | ORAL | Status: DC | PRN

## 2020-02-10 MED ORDER — DEXAMETHASONE SODIUM PHOSPHATE 10 MG/ML IJ SOLN
INTRAMUSCULAR | Status: AC
  Filled 2020-02-10: qty 1

## 2020-02-10 MED ORDER — OXYCODONE HCL 5 MG PO TABS: 5.0000 mg | ORAL_TABLET | Freq: Four times a day (QID) | ORAL | 0 refills | Status: DC | PRN

## 2020-02-10 MED ORDER — SUCCINYLCHOLINE CHLORIDE 200 MG/10ML IV SOSY
PREFILLED_SYRINGE | INTRAVENOUS | Status: AC
  Filled 2020-02-10: qty 10

## 2020-02-10 MED ORDER — BUPIVACAINE HCL (PF) 0.25 % IJ SOLN
INTRAMUSCULAR | Status: DC | PRN
  Administered 2020-02-10: 30 mL

## 2020-02-10 MED ORDER — ROCURONIUM BROMIDE 10 MG/ML (PF) SYRINGE
PREFILLED_SYRINGE | INTRAVENOUS | Status: AC
  Filled 2020-02-10: qty 10

## 2020-02-10 MED ORDER — FENTANYL CITRATE (PF) 100 MCG/2ML IJ SOLN
INTRAMUSCULAR | Status: DC | PRN
  Administered 2020-02-10 (×2): 50 ug via INTRAVENOUS

## 2020-02-10 MED ORDER — ENOXAPARIN SODIUM 40 MG/0.4ML ~~LOC~~ SOLN
40.0000 mg | Freq: Once | SUBCUTANEOUS | Status: AC
  Administered 2020-02-10: 40 mg via SUBCUTANEOUS

## 2020-02-10 MED ORDER — IBUPROFEN 800 MG PO TABS: 800.0000 mg | ORAL_TABLET | Freq: Three times a day (TID) | ORAL | 0 refills | Status: DC | PRN

## 2020-02-10 MED ORDER — FENTANYL CITRATE (PF) 100 MCG/2ML IJ SOLN: 25.0000 ug | INTRAMUSCULAR | Status: DC | PRN

## 2020-02-10 MED ORDER — LIDOCAINE 2% (20 MG/ML) 5 ML SYRINGE
INTRAMUSCULAR | Status: AC
  Filled 2020-02-10: qty 5

## 2020-02-10 MED ORDER — ACETAMINOPHEN 160 MG/5ML PO SOLN: 325.0000 mg | Freq: Once | ORAL | Status: DC | PRN

## 2020-02-10 MED ORDER — ACETAMINOPHEN 325 MG PO TABS: 325.0000 mg | ORAL_TABLET | Freq: Once | ORAL | Status: DC | PRN

## 2020-02-10 MED ORDER — ONDANSETRON HCL 4 MG/2ML IJ SOLN
INTRAMUSCULAR | Status: AC
  Filled 2020-02-10: qty 2

## 2020-02-10 MED ORDER — SUCCINYLCHOLINE CHLORIDE 20 MG/ML IJ SOLN
INTRAMUSCULAR | Status: DC | PRN
  Administered 2020-02-10: 140 mg via INTRAVENOUS

## 2020-02-10 MED ORDER — ACETAMINOPHEN 500 MG PO TABS
ORAL_TABLET | ORAL | Status: AC
  Filled 2020-02-10: qty 2

## 2020-02-10 MED ORDER — ENOXAPARIN SODIUM 40 MG/0.4ML ~~LOC~~ SOLN
SUBCUTANEOUS | Status: AC
  Filled 2020-02-10: qty 0.4

## 2020-02-10 MED ORDER — ACETAMINOPHEN 500 MG PO TABS
1000.0000 mg | ORAL_TABLET | ORAL | Status: AC
  Administered 2020-02-10: 1000 mg via ORAL

## 2020-02-10 MED ORDER — ACETAMINOPHEN 10 MG/ML IV SOLN: 1000.0000 mg | Freq: Once | INTRAVENOUS | Status: DC | PRN

## 2020-02-10 MED ORDER — BUPIVACAINE LIPOSOME 1.3 % IJ SUSP: 20.0000 mL | Freq: Once | INTRAMUSCULAR | Status: DC

## 2020-02-10 MED ORDER — AMISULPRIDE (ANTIEMETIC) 5 MG/2ML IV SOLN: 10.0000 mg | Freq: Once | INTRAVENOUS | Status: DC | PRN

## 2020-02-10 MED ORDER — CEFAZOLIN SODIUM-DEXTROSE 2-4 GM/100ML-% IV SOLN
2.0000 g | INTRAVENOUS | Status: AC
  Administered 2020-02-10: 2 g via INTRAVENOUS

## 2020-02-10 MED ORDER — OXYCODONE HCL 5 MG/5ML PO SOLN: 5.0000 mg | Freq: Once | ORAL | Status: DC | PRN

## 2020-02-10 MED ORDER — FENTANYL CITRATE (PF) 100 MCG/2ML IJ SOLN
INTRAMUSCULAR | Status: AC
  Filled 2020-02-10: qty 2

## 2020-02-10 MED ORDER — ONDANSETRON HCL 4 MG/2ML IJ SOLN
INTRAMUSCULAR | Status: DC | PRN
  Administered 2020-02-10: 4 mg via INTRAVENOUS

## 2020-02-10 SURGICAL SUPPLY — 55 items
ADH SKN CLS APL DERMABOND .7 (GAUZE/BANDAGES/DRESSINGS) ×1
APL PRP STRL LF DISP 70% ISPRP (MISCELLANEOUS) ×1
BLADE CLIPPER SURG (BLADE) IMPLANT
BLADE SURG 10 STRL SS (BLADE) IMPLANT
BLADE SURG 15 STRL LF DISP TIS (BLADE) ×1 IMPLANT
BLADE SURG 15 STRL SS (BLADE) ×2
CANISTER SUCT 1200ML W/VALVE (MISCELLANEOUS) ×2 IMPLANT
CHLORAPREP W/TINT 26 (MISCELLANEOUS) ×2 IMPLANT
CLSR STERI-STRIP ANTIMIC 1/2X4 (GAUZE/BANDAGES/DRESSINGS) ×2 IMPLANT
COVER BACK TABLE 60X90IN (DRAPES) ×2 IMPLANT
COVER MAYO STAND STRL (DRAPES) ×2 IMPLANT
COVER WAND RF STERILE (DRAPES) IMPLANT
DECANTER SPIKE VIAL GLASS SM (MISCELLANEOUS) IMPLANT
DERMABOND ADVANCED (GAUZE/BANDAGES/DRESSINGS) ×1
DERMABOND ADVANCED .7 DNX12 (GAUZE/BANDAGES/DRESSINGS) ×1 IMPLANT
DRAPE LAPAROTOMY 100X72 PEDS (DRAPES) ×2 IMPLANT
DRAPE UTILITY XL STRL (DRAPES) ×2 IMPLANT
DRSG TEGADERM 4X4.75 (GAUZE/BANDAGES/DRESSINGS) ×2 IMPLANT
DRSG TELFA 3X8 NADH (GAUZE/BANDAGES/DRESSINGS) ×2 IMPLANT
ELECT COATED BLADE 2.86 ST (ELECTRODE) ×2 IMPLANT
ELECT REM PT RETURN 9FT ADLT (ELECTROSURGICAL) ×2
ELECTRODE REM PT RTRN 9FT ADLT (ELECTROSURGICAL) ×1 IMPLANT
GAUZE PACKING IODOFORM 1/4X15 (PACKING) IMPLANT
GAUZE SPONGE 4X4 12PLY STRL LF (GAUZE/BANDAGES/DRESSINGS) IMPLANT
GLOVE BIOGEL PI IND STRL 6 (GLOVE) ×1 IMPLANT
GLOVE BIOGEL PI IND STRL 7.0 (GLOVE) ×1 IMPLANT
GLOVE BIOGEL PI INDICATOR 6 (GLOVE) ×1
GLOVE BIOGEL PI INDICATOR 7.0 (GLOVE) ×1
GLOVE ECLIPSE 6.5 STRL STRAW (GLOVE) ×2 IMPLANT
GLOVE SURG ENC MOIS LTX SZ6.5 (GLOVE) ×2 IMPLANT
GOWN STRL REUS W/ TWL LRG LVL3 (GOWN DISPOSABLE) ×2 IMPLANT
GOWN STRL REUS W/TWL LRG LVL3 (GOWN DISPOSABLE) ×4
KIT MARKER MARGIN INK (KITS) IMPLANT
NEEDLE HYPO 25X1 1.5 SAFETY (NEEDLE) ×2 IMPLANT
NS IRRIG 1000ML POUR BTL (IV SOLUTION) ×2 IMPLANT
PACK BASIN DAY SURGERY FS (CUSTOM PROCEDURE TRAY) ×2 IMPLANT
PENCIL SMOKE EVACUATOR (MISCELLANEOUS) ×2 IMPLANT
SLEEVE SCD COMPRESS KNEE MED (MISCELLANEOUS) ×2 IMPLANT
SPONGE LAP 18X18 RF (DISPOSABLE) ×2 IMPLANT
SUT ETHILON 2 0 FS 18 (SUTURE) ×4 IMPLANT
SUT MNCRL AB 4-0 PS2 18 (SUTURE) ×2 IMPLANT
SUT SILK 2 0 SH (SUTURE) IMPLANT
SUT VIC AB 2-0 SH 27 (SUTURE)
SUT VIC AB 2-0 SH 27XBRD (SUTURE) IMPLANT
SUT VIC AB 3-0 SH 27 (SUTURE) ×8
SUT VIC AB 3-0 SH 27X BRD (SUTURE) ×4 IMPLANT
SUT VICRYL 3-0 CR8 SH (SUTURE) IMPLANT
SWAB COLLECTION DEVICE MRSA (MISCELLANEOUS) IMPLANT
SWAB CULTURE ESWAB REG 1ML (MISCELLANEOUS) IMPLANT
SYR BULB IRRIG 60ML STRL (SYRINGE) ×2 IMPLANT
SYR CONTROL 10ML LL (SYRINGE) ×2 IMPLANT
TOWEL GREEN STERILE FF (TOWEL DISPOSABLE) ×2 IMPLANT
TUBE CONNECTING 20X1/4 (TUBING) ×4 IMPLANT
UNDERPAD 30X36 HEAVY ABSORB (UNDERPADS AND DIAPERS) IMPLANT
YANKAUER SUCT BULB TIP NO VENT (SUCTIONS) ×2 IMPLANT

## 2020-02-10 NOTE — Anesthesia Preprocedure Evaluation (Addendum)
Anesthesia Evaluation  Patient identified by MRN, date of birth, ID band Patient awake    Reviewed: Allergy & Precautions, NPO status , Patient's Chart, lab work & pertinent test results  Airway Mallampati: II  TM Distance: >3 FB Neck ROM: Full    Dental  (+) Chipped,    Pulmonary neg pulmonary ROS,    Pulmonary exam normal        Cardiovascular hypertension, Pt. on medications  Rhythm:Regular Rate:Normal     Neuro/Psych negative neurological ROS     GI/Hepatic negative GI ROS, Neg liver ROS,   Endo/Other  negative endocrine ROS  Renal/GU negative Renal ROS     Musculoskeletal negative musculoskeletal ROS (+)   Abdominal Normal abdominal exam  (+)   Peds  Hematology negative hematology ROS (+)   Anesthesia Other Findings   Reproductive/Obstetrics                            Anesthesia Physical Anesthesia Plan  ASA: II  Anesthesia Plan: General   Post-op Pain Management:    Induction: Intravenous  PONV Risk Score and Plan: 4 or greater and Ondansetron, Dexamethasone and Treatment may vary due to age or medical condition  Airway Management Planned: Oral ETT  Additional Equipment: None  Intra-op Plan:   Post-operative Plan: Extubation in OR  Informed Consent: I have reviewed the patients History and Physical, chart, labs and discussed the procedure including the risks, benefits and alternatives for the proposed anesthesia with the patient or authorized representative who has indicated his/her understanding and acceptance.     Dental advisory given  Plan Discussed with: CRNA  Anesthesia Plan Comments:        Anesthesia Quick Evaluation

## 2020-02-10 NOTE — Transfer of Care (Signed)
Immediate Anesthesia Transfer of Care Note  Patient: Natasha Vazquez  Procedure(s) Performed: excision of soft tissue mass left upper back (Left Back)  Patient Location: PACU  Anesthesia Type:General  Level of Consciousness: drowsy  Airway & Oxygen Therapy: Patient Spontanous Breathing and Patient connected to face mask oxygen  Post-op Assessment: Report given to RN and Post -op Vital signs reviewed and stable  Post vital signs: Reviewed and stable  Last Vitals:  Vitals Value Taken Time  BP 174/78 02/10/20 1520  Temp 36.7 C 02/10/20 1520  Pulse 88 02/10/20 1527  Resp 21 02/10/20 1527  SpO2 100 % 02/10/20 1527  Vitals shown include unvalidated device data.  Last Pain:  Vitals:   02/10/20 1520  TempSrc:   PainSc: Asleep      Patients Stated Pain Goal: 5 (70/17/79 3903)  Complications: No complications documented.

## 2020-02-10 NOTE — Op Note (Signed)
   Operative Note   Date: 02/10/2020  Procedure: excision of soft tissue mass of left upper back  Pre-op diagnosis: soft tissue mass-left upper back, likely lipoma Post-op diagnosis: same  Indication and clinical history: The patient is a 70 y.o. year old female with a left upper back soft tissue mass     Surgeon: Jesusita Oka, MD  Anesthesiologist: Nyoka Cowden, MD Anesthesia: General  Findings:  . Specimen: soft tissue mass-left upper back, ~10x10x4cm . EBL: <10cc . Drains/Implants:   Disposition: PACU - hemodynamically stable.  Description of procedure: The patient was positioned supine on the operating room table. General anesthetic induction and intubation were uneventful. Foley catheter insertion was performed and was atraumatic. Time-out was performed verifying correct patient, procedure, signature of informed consent, and administration of pre-operative antibiotics, VTE prophylaxis with low molecular weight heparin. The left upper back and shoulder were prepped and draped in the usual sterile fashion.   An elliptical incision was made horizontally over the mass and the mass circumferentially excised. The cavity was irrigated copiously and meticulous hemostasis was achieved using electrocautery. The dead space was obliterated using 3-0 vicryl suture. The skin was re-approximated with 4-0 monocryl suture and 2-0 nylons used to buttress the repair.   Sterile dressings were applied. All sponge and instrument counts were correct at the conclusion of the procedure. The patient was awakened from anesthesia, extubated uneventfully, and transported to the PACU in good condition. There were no complications.   Clinical update provided to daughter via phone.    Jesusita Oka, MD General and [facility]

## 2020-02-10 NOTE — Anesthesia Procedure Notes (Signed)
Procedure Name: Intubation Date/Time: 02/10/2020 1:55 PM Performed by: Willa Frater, CRNA Pre-anesthesia Checklist: Patient identified, Emergency Drugs available, Suction available and Patient being monitored Patient Re-evaluated:Patient Re-evaluated prior to induction Oxygen Delivery Method: Circle system utilized Preoxygenation: Pre-oxygenation with 100% oxygen Induction Type: IV induction Ventilation: Mask ventilation without difficulty Laryngoscope Size: Mac and 3 Grade View: Grade I Tube type: Oral Tube size: 7.0 mm Number of attempts: 1 Airway Equipment and Method: Stylet and Oral airway Placement Confirmation: ETT inserted through vocal cords under direct vision,  positive ETCO2 and breath sounds checked- equal and bilateral Secured at: 22 cm Tube secured with: Tape Dental Injury: Teeth and Oropharynx as per pre-operative assessment

## 2020-02-10 NOTE — Anesthesia Postprocedure Evaluation (Signed)
Anesthesia Post Note  Patient: Natasha Vazquez  Procedure(s) Performed: excision of soft tissue mass left upper back (Left Back)     Patient location during evaluation: PACU Anesthesia Type: General Level of consciousness: awake Pain management: pain level controlled Vital Signs Assessment: post-procedure vital signs reviewed and stable Respiratory status: spontaneous breathing Cardiovascular status: stable Postop Assessment: no apparent nausea or vomiting Anesthetic complications: no   No complications documented.  Last Vitals:  Vitals:   02/10/20 1224 02/10/20 1520  BP: (!) 181/84 (!) 174/78  Pulse: 74 92  Resp: 18 17  Temp: 36.8 C 36.7 C  SpO2: (!) 10% 100%    Last Pain:  Vitals:   02/10/20 1520  TempSrc:   PainSc: Asleep                 Bailea Beed

## 2020-02-10 NOTE — Discharge Instructions (Signed)
May shower beginning 02/11/2020. May remove dressing 02/12/2020. May allow warm soapy water to run over incision, then rinse and pat dry. Do not soak in any water (tubs, hot tubs, pools, lakes, oceans) for one week.   No lifting your arms above the level of your shoulders for one week.   Call the office at [phone] for temperature greater than 101.87F, worsening pain, redness or warmth at the incision site.  Please call [phone] to make an appointment for 1-2 weeks after surgery for wound check.      Next dose of Tylenol can be given after 6:30PM.     Post Anesthesia Home Care Instructions  Activity: Get plenty of rest for the remainder of the day. A responsible individual must stay with you for 24 hours following the procedure.  For the next 24 hours, DO NOT: -Drive a car -Paediatric nurse -Drink alcoholic beverages -Take any medication unless instructed by your physician -Make any legal decisions or sign important papers.  Meals: Start with liquid foods such as gelatin or soup. Progress to regular foods as tolerated. Avoid greasy, spicy, heavy foods. If nausea and/or vomiting occur, drink only clear liquids until the nausea and/or vomiting subsides. Call your physician if vomiting continues.  Special Instructions/Symptoms: Your throat may feel dry or sore from the anesthesia or the breathing tube placed in your throat during surgery. If this causes discomfort, gargle with warm salt water. The discomfort should disappear within 24 hours.  If you had a scopolamine patch placed behind your ear for the management of post- operative nausea and/or vomiting:  1. The medication in the patch is effective for 72 hours, after which it should be removed.  Wrap patch in a tissue and discard in the trash. Wash hands thoroughly with soap and water. 2. You may remove the patch earlier than 72 hours if you experience unpleasant side effects which may include dry mouth, dizziness or visual  disturbances. 3. Avoid touching the patch. Wash your hands with soap and water after contact with the patch.

## 2020-02-10 NOTE — H&P (Signed)
     Natasha Vazquez is an 70 y.o. female.   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 The patient has had no hospitalizations, doctors visits, ER visits, surgeries, or newly diagnosed allergies since being seen in the office.    Past Medical History:  Diagnosis Date  . Hypertension   . TMJ (dislocation of temporomandibular joint)     Past Surgical History:  Procedure Laterality Date  . ABDOMINAL HYSTERECTOMY    . CHOLECYSTECTOMY    . IR ANGIO INTRA EXTRACRAN SEL INTERNAL CAROTID BILAT MOD SED  09/16/2016  . IR ANGIO VERTEBRAL SEL VERTEBRAL BILAT MOD SED  09/16/2016    History reviewed. No pertinent family history.  Social History:  reports that she has never smoked. She has never used smokeless tobacco. She reports that she does not drink alcohol and does not use drugs.  Allergies:  Allergies  Allergen Reactions  . Aspirin Other (See Comments)    Had a GI bleed  . Penicillins Rash and Other (See Comments)    Has patient had a PCN reaction causing immediate rash, facial/tongue/throat swelling, SOB or lightheadedness with hypotension: Yes Has patient had a PCN reaction causing severe rash involving mucus membranes or skin necrosis: No Has patient had a PCN reaction that required hospitalization: Unknown Has patient had a PCN reaction occurring within the last 10 years: No If all of the above answers are "NO", then may proceed with Cephalosporin use.     Medications: I have reviewed the patient's current medications.  No results found for this or any previous visit (from the past 48 hour(s)).  No results found.  ROS 10 point review of systems is negative except as listed above in HPI.   Physical Exam Blood pressure (!) 181/84, pulse 74, temperature 98.2 F (36.8 C), temperature source Oral, resp. rate 18, height 5\' 8"  (1.727 m), weight 76.8 kg, SpO2 (!) 10 %. Constitutional: well-developed, well-nourished HEENT: pupils equal, round, reactive to light,  89mm b/l, moist conjunctiva, external inspection of ears and nose normal, hearing intact Oropharynx: normal oropharyngeal mucosa, normal dentition Neck: no thyromegaly, trachea midline, no midline cervical tenderness to palpation Chest: breath sounds equal bilaterally, normal respiratory effort, no midline or lateral chest wall tenderness to palpation/deformity Abdomen: soft, NT, no bruising, no hepatosplenomegaly GU: normal female genitalia  Back: no wounds, no thoracic/lumbar spine tenderness to palpation, no thoracic/lumbar spine stepoffs Rectal: deferred Extremities: 2+ radial and pedal pulses bilaterally, motor and sensation intact to bilateral UE and LE, no peripheral edema MSK: unable to assess gait/station, no clubbing/cyanosis of fingers/toes, normal ROM of all four extremities Skin: warm, dry, no rashes, left upper back mass, soft, mobile, nontender Psych: normal memory, normal mood/affect    Assessment/Plan: 1F with left upper back mass. Informed consent was obtained after detailed explanation of risks, including bleeding, infection, hematoma/seroma, recurrence. All questions answered to the patient's satisfaction.   Jesusita Oka, MD General and [facility]

## 2020-02-13 ENCOUNTER — Encounter (HOSPITAL_BASED_OUTPATIENT_CLINIC_OR_DEPARTMENT_OTHER): Payer: Self-pay | Admitting: Surgery

## 2020-02-13 LAB — SURGICAL PATHOLOGY

## 2020-06-18 DIAGNOSIS — R7309 Other abnormal glucose: Secondary | ICD-10-CM | POA: Diagnosis not present

## 2020-06-18 DIAGNOSIS — E785 Hyperlipidemia, unspecified: Secondary | ICD-10-CM | POA: Diagnosis not present

## 2020-06-18 DIAGNOSIS — I10 Essential (primary) hypertension: Secondary | ICD-10-CM | POA: Diagnosis not present

## 2020-07-16 DIAGNOSIS — H353114 Nonexudative age-related macular degeneration, right eye, advanced atrophic with subfoveal involvement: Secondary | ICD-10-CM | POA: Diagnosis not present

## 2020-07-16 DIAGNOSIS — H53041 Amblyopia suspect, right eye: Secondary | ICD-10-CM | POA: Diagnosis not present

## 2020-07-16 DIAGNOSIS — G43809 Other migraine, not intractable, without status migrainosus: Secondary | ICD-10-CM | POA: Diagnosis not present

## 2020-07-16 DIAGNOSIS — H353122 Nonexudative age-related macular degeneration, left eye, intermediate dry stage: Secondary | ICD-10-CM | POA: Diagnosis not present

## 2020-07-26 DIAGNOSIS — E785 Hyperlipidemia, unspecified: Secondary | ICD-10-CM | POA: Diagnosis not present

## 2020-07-26 DIAGNOSIS — I10 Essential (primary) hypertension: Secondary | ICD-10-CM | POA: Diagnosis not present

## 2020-07-26 DIAGNOSIS — E7800 Pure hypercholesterolemia, unspecified: Secondary | ICD-10-CM | POA: Diagnosis not present

## 2020-09-27 DIAGNOSIS — E7800 Pure hypercholesterolemia, unspecified: Secondary | ICD-10-CM | POA: Diagnosis not present

## 2020-09-27 DIAGNOSIS — E785 Hyperlipidemia, unspecified: Secondary | ICD-10-CM | POA: Diagnosis not present

## 2020-09-27 DIAGNOSIS — I10 Essential (primary) hypertension: Secondary | ICD-10-CM | POA: Diagnosis not present

## 2020-11-19 ENCOUNTER — Other Ambulatory Visit (HOSPITAL_COMMUNITY): Payer: Self-pay | Admitting: Family Medicine

## 2020-11-19 DIAGNOSIS — Z1231 Encounter for screening mammogram for malignant neoplasm of breast: Secondary | ICD-10-CM

## 2020-11-22 ENCOUNTER — Other Ambulatory Visit: Payer: Self-pay

## 2020-11-22 ENCOUNTER — Encounter (HOSPITAL_COMMUNITY): Payer: Self-pay

## 2020-11-22 ENCOUNTER — Ambulatory Visit (HOSPITAL_COMMUNITY)
Admission: RE | Admit: 2020-11-22 | Discharge: 2020-11-22 | Disposition: A | Discharge status: Home / Self Care | Payer: Medicare Other | Source: Ambulatory Visit | Attending: Family Medicine | Admitting: Family Medicine

## 2020-11-22 DIAGNOSIS — Z1231 Encounter for screening mammogram for malignant neoplasm of breast: Secondary | ICD-10-CM | POA: Insufficient documentation

## 2020-12-04 DIAGNOSIS — I639 Cerebral infarction, unspecified: Secondary | ICD-10-CM

## 2020-12-04 HISTORY — DX: Cerebral infarction, unspecified: I63.9

## 2020-12-25 ENCOUNTER — Encounter (HOSPITAL_COMMUNITY): Payer: Self-pay

## 2020-12-25 ENCOUNTER — Inpatient Hospital Stay (HOSPITAL_COMMUNITY)
Admission: EM | Admit: 2020-12-25 | Discharge: 2021-01-02 | DRG: 064 | Disposition: A | Discharge status: Rehabilitation Facility | Payer: Medicare Other | Attending: Family Medicine | Admitting: Family Medicine

## 2020-12-25 ENCOUNTER — Other Ambulatory Visit: Payer: Self-pay

## 2020-12-25 ENCOUNTER — Emergency Department (HOSPITAL_COMMUNITY): Payer: Medicare Other

## 2020-12-25 DIAGNOSIS — E782 Mixed hyperlipidemia: Secondary | ICD-10-CM | POA: Diagnosis present

## 2020-12-25 DIAGNOSIS — R739 Hyperglycemia, unspecified: Secondary | ICD-10-CM | POA: Diagnosis present

## 2020-12-25 DIAGNOSIS — R1310 Dysphagia, unspecified: Secondary | ICD-10-CM | POA: Diagnosis not present

## 2020-12-25 DIAGNOSIS — G459 Transient cerebral ischemic attack, unspecified: Secondary | ICD-10-CM | POA: Diagnosis not present

## 2020-12-25 DIAGNOSIS — Z20822 Contact with and (suspected) exposure to covid-19: Secondary | ICD-10-CM | POA: Diagnosis not present

## 2020-12-25 DIAGNOSIS — R29713 NIHSS score 13: Secondary | ICD-10-CM | POA: Diagnosis present

## 2020-12-25 DIAGNOSIS — R000 Tachycardia, unspecified: Secondary | ICD-10-CM | POA: Diagnosis not present

## 2020-12-25 DIAGNOSIS — R4701 Aphasia: Secondary | ICD-10-CM | POA: Diagnosis not present

## 2020-12-25 DIAGNOSIS — G936 Cerebral edema: Secondary | ICD-10-CM | POA: Diagnosis not present

## 2020-12-25 DIAGNOSIS — R29810 Facial weakness: Secondary | ICD-10-CM | POA: Diagnosis not present

## 2020-12-25 DIAGNOSIS — Z880 Allergy status to penicillin: Secondary | ICD-10-CM

## 2020-12-25 DIAGNOSIS — R471 Dysarthria and anarthria: Secondary | ICD-10-CM | POA: Diagnosis not present

## 2020-12-25 DIAGNOSIS — G8321 Monoplegia of upper limb affecting right dominant side: Secondary | ICD-10-CM | POA: Diagnosis present

## 2020-12-25 DIAGNOSIS — I63512 Cerebral infarction due to unspecified occlusion or stenosis of left middle cerebral artery: Principal | ICD-10-CM | POA: Diagnosis present

## 2020-12-25 DIAGNOSIS — I6611 Occlusion and stenosis of right anterior cerebral artery: Secondary | ICD-10-CM | POA: Diagnosis not present

## 2020-12-25 DIAGNOSIS — I10 Essential (primary) hypertension: Secondary | ICD-10-CM | POA: Diagnosis not present

## 2020-12-25 DIAGNOSIS — Z7982 Long term (current) use of aspirin: Secondary | ICD-10-CM | POA: Diagnosis not present

## 2020-12-25 DIAGNOSIS — I639 Cerebral infarction, unspecified: Secondary | ICD-10-CM | POA: Diagnosis not present

## 2020-12-25 DIAGNOSIS — E041 Nontoxic single thyroid nodule: Secondary | ICD-10-CM | POA: Diagnosis not present

## 2020-12-25 DIAGNOSIS — G9389 Other specified disorders of brain: Secondary | ICD-10-CM | POA: Diagnosis not present

## 2020-12-25 DIAGNOSIS — R404 Transient alteration of awareness: Secondary | ICD-10-CM | POA: Diagnosis not present

## 2020-12-25 DIAGNOSIS — R6889 Other general symptoms and signs: Secondary | ICD-10-CM | POA: Diagnosis not present

## 2020-12-25 DIAGNOSIS — M47812 Spondylosis without myelopathy or radiculopathy, cervical region: Secondary | ICD-10-CM | POA: Diagnosis not present

## 2020-12-25 DIAGNOSIS — I5032 Chronic diastolic (congestive) heart failure: Secondary | ICD-10-CM | POA: Diagnosis not present

## 2020-12-25 DIAGNOSIS — I110 Hypertensive heart disease with heart failure: Secondary | ICD-10-CM | POA: Diagnosis not present

## 2020-12-25 DIAGNOSIS — G40909 Epilepsy, unspecified, not intractable, without status epilepticus: Secondary | ICD-10-CM | POA: Diagnosis present

## 2020-12-25 DIAGNOSIS — Z743 Need for continuous supervision: Secondary | ICD-10-CM | POA: Diagnosis not present

## 2020-12-25 DIAGNOSIS — I6603 Occlusion and stenosis of bilateral middle cerebral arteries: Secondary | ICD-10-CM | POA: Diagnosis not present

## 2020-12-25 LAB — CBC
HCT: 38 % (ref 36.0–46.0)
Hemoglobin: 12.9 g/dL (ref 12.0–15.0)
MCH: 32.8 pg (ref 26.0–34.0)
MCHC: 33.9 g/dL (ref 30.0–36.0)
MCV: 96.7 fL (ref 80.0–100.0)
Platelets: 333 10*3/uL (ref 150–400)
RBC: 3.93 MIL/uL (ref 3.87–5.11)
RDW: 12.1 % (ref 11.5–15.5)
WBC: 8 10*3/uL (ref 4.0–10.5)
nRBC: 0 % (ref 0.0–0.2)

## 2020-12-25 LAB — DIFFERENTIAL
Abs Immature Granulocytes: 0.02 10*3/uL (ref 0.00–0.07)
Basophils Absolute: 0 10*3/uL (ref 0.0–0.1)
Basophils Relative: 0 %
Eosinophils Absolute: 0 10*3/uL (ref 0.0–0.5)
Eosinophils Relative: 0 %
Immature Granulocytes: 0 %
Lymphocytes Relative: 23 %
Lymphs Abs: 1.8 10*3/uL (ref 0.7–4.0)
Monocytes Absolute: 0.6 10*3/uL (ref 0.1–1.0)
Monocytes Relative: 7 %
Neutro Abs: 5.6 10*3/uL (ref 1.7–7.7)
Neutrophils Relative %: 70 %

## 2020-12-25 LAB — I-STAT CHEM 8, ED
BUN: 18 mg/dL (ref 8–23)
Calcium, Ion: 1.22 mmol/L (ref 1.15–1.40)
Chloride: 105 mmol/L (ref 98–111)
Creatinine, Ser: 0.7 mg/dL (ref 0.44–1.00)
Glucose, Bld: 143 mg/dL — ABNORMAL HIGH (ref 70–99)
HCT: 40 % (ref 36.0–46.0)
Hemoglobin: 13.6 g/dL (ref 12.0–15.0)
Potassium: 4 mmol/L (ref 3.5–5.1)
Sodium: 140 mmol/L (ref 135–145)
TCO2: 24 mmol/L (ref 22–32)

## 2020-12-25 LAB — COMPREHENSIVE METABOLIC PANEL
ALT: 37 U/L (ref 0–44)
AST: 48 U/L — ABNORMAL HIGH (ref 15–41)
Albumin: 4.4 g/dL (ref 3.5–5.0)
Alkaline Phosphatase: 112 U/L (ref 38–126)
Anion gap: 11 (ref 5–15)
BUN: 19 mg/dL (ref 8–23)
CO2: 21 mmol/L — ABNORMAL LOW (ref 22–32)
Calcium: 9.6 mg/dL (ref 8.9–10.3)
Chloride: 103 mmol/L (ref 98–111)
Creatinine, Ser: 0.75 mg/dL (ref 0.44–1.00)
GFR, Estimated: >60 mL/min (ref >60)
Glucose, Bld: 147 mg/dL — ABNORMAL HIGH (ref 70–99)
Potassium: 4.1 mmol/L (ref 3.5–5.1)
Sodium: 135 mmol/L (ref 135–145)
Total Bilirubin: 0.7 mg/dL (ref 0.3–1.2)
Total Protein: 8 g/dL (ref 6.5–8.1)

## 2020-12-25 LAB — RESP PANEL BY RT-PCR (FLU A&B, COVID) ARPGX2
Influenza A by PCR: NEGATIVE
Influenza B by PCR: NEGATIVE
SARS Coronavirus 2 by RT PCR: NEGATIVE

## 2020-12-25 LAB — PROTIME-INR
INR: 1 (ref 0.8–1.2)
Prothrombin Time: 13.5 seconds (ref 11.4–15.2)

## 2020-12-25 LAB — APTT: aPTT: 27 seconds (ref 24–36)

## 2020-12-25 LAB — ETHANOL: Alcohol, Ethyl (B): <10 mg/dL (ref <10)

## 2020-12-25 MED ORDER — SODIUM CHLORIDE 0.9 % IV SOLN
100.0000 mL/h | INTRAVENOUS | Status: DC
  Administered 2020-12-25 – 2020-12-26 (×3): 100 mL/h via INTRAVENOUS

## 2020-12-25 MED ORDER — SODIUM CHLORIDE 0.9 % IV BOLUS
500.0000 mL | Freq: Once | INTRAVENOUS | Status: AC
  Administered 2020-12-25: 500 mL via INTRAVENOUS

## 2020-12-25 MED ORDER — IOHEXOL 350 MG/ML SOLN
100.0000 mL | Freq: Once | INTRAVENOUS | Status: AC | PRN
  Administered 2020-12-25: 100 mL via INTRAVENOUS

## 2020-12-25 NOTE — ED Provider Notes (Signed)
[facility] EMERGENCY DEPARTMENT Provider Note   CSN: [id number] Arrival date & time: 12/25/20  2022     History Chief Complaint  Patient presents with   Altered Mental Status    Natasha Vazquez is a 70 y.o. female.   Altered Mental Status  Patient presents to the ED for evaluation of altered mental status.  According to the EMS report patient was last seen normal yesterday at about 2 in the afternoon.  Patient was found on the floor at home covered in feces.  Patient was not responding.  In the ED the patient looks at me but she is able to speak.  She attempts but words are incomprehensible.  Patient is unable to provide any other history.  Currently patient's family is not present  Past Medical History:  Diagnosis Date   Hypertension    TMJ (dislocation of temporomandibular joint)     There are no problems to display for this patient.   Past Surgical History:  Procedure Laterality Date   ABDOMINAL HYSTERECTOMY     CHOLECYSTECTOMY     IR ANGIO INTRA EXTRACRAN SEL INTERNAL CAROTID BILAT MOD SED  09/16/2016   IR ANGIO VERTEBRAL SEL VERTEBRAL BILAT MOD SED  09/16/2016   MASS EXCISION Left 02/10/2020   Procedure: excision of soft tissue mass left upper back;  Surgeon: Jesusita Oka, MD;  Location: [place];  Service: General;  Laterality: Left;     OB History   No obstetric history on file.     History reviewed. No pertinent family history.  Social History   Tobacco Use   Smoking status: Never   Smokeless tobacco: Never  Vaping Use   Vaping Use: Never used  Substance Use Topics   Alcohol use: No   Drug use: No    Home Medications Prior to Admission medications   Medication Sig Start Date End Date Taking? Authorizing Provider  metroNIDAZOLE (METROGEL) 1 % gel 1 application 1/60/10  Yes [provider]  amLODipine (NORVASC) 2.5 MG tablet Take 2.5 mg by mouth daily. 11/10/19   [provider]  amLODipine (NORVASC) 2.5 MG  tablet Take 1 tablet by mouth daily.    [provider]  carboxymethylcellulose (REFRESH PLUS) 0.5 % SOLN Place 1 drop into both eyes in the morning and at bedtime.    [provider]  cholecalciferol (VITAMIN D3) 25 MCG (1000 UNIT) tablet 1 tablet    [provider]  cycloSPORINE (RESTASIS) 0.05 % ophthalmic emulsion 1 drop into affected eye    [provider]  ibuprofen (ADVIL) 800 MG tablet Take 1 tablet (800 mg total) by mouth every 8 (eight) hours as needed. Take with food 02/10/20   Jesusita Oka, MD  losartan (COZAAR) 100 MG tablet Take 100 mg by mouth daily. 05/15/16   [provider]  losartan (COZAAR) 100 MG tablet Take 1 tablet by mouth daily.    [provider]  Meclizine HCl 25 MG CHEW 1 tablet as needed    [provider]  meloxicam (MOBIC) 15 MG tablet Take 15 mg by mouth daily as needed (hip pain.). 12/19/19   [provider]  metroNIDAZOLE (METROCREAM) 0.75 % cream Apply 1 application topically daily. 12/03/19   [provider]  Multiple Vitamins-Minerals (PRESERVISION AREDS 2 PO) Take 1 tablet by mouth in the morning and at bedtime.    [provider]  Multiple Vitamins-Minerals (PRESERVISION AREDS) TABS See admin instructions.    [provider]  Omega-3 Fatty Acids (FISH OIL PO) Take 2,400 mg by mouth in the morning and at bedtime.    [provider]  Omega-3 Fatty Acids (FISH OIL) 1000 MG CAPS Take 1 capsule by mouth daily.    [provider]  oxyCODONE (ROXICODONE) 5 MG immediate release tablet Take 1 tablet (5 mg total) by mouth every 6 (six) hours as needed. Alternate each dose with tylenol/ibuprofen 02/10/20   Jesusita Oka, MD  Polyvinyl Alcohol-Povidone PF (REFRESH) 1.4-0.6 % SOLN both eyes    [provider]  Propylene Glycol (SYSTANE COMPLETE) 0.6 % SOLN Place 1 tablet into both eyes in the morning and at bedtime.    [provider]   RESTASIS 0.05 % ophthalmic emulsion Place 1 drop into both eyes 2 (two) times daily. 12/27/19   [provider]  rosuvastatin (CRESTOR) 5 MG tablet Take 5 mg by mouth every other day. In the morning 01/12/20   [provider]    Allergies    Aspirin and Penicillins  Review of Systems   Review of Systems  Unable to perform ROS: Mental status change   Physical Exam Updated Vital Signs BP (!) 180/90   Pulse 90   Temp 98.3 F (36.8 C) (Oral)   Resp 17   SpO2 100%   Physical Exam Vitals and nursing note reviewed.  Constitutional:      Appearance: She is well-developed. She is not diaphoretic.  HENT:     Head: Normocephalic and atraumatic.     Right Ear: External ear normal.     Left Ear: External ear normal.  Eyes:     General: No scleral icterus.       Right eye: No discharge.        Left eye: No discharge.     Conjunctiva/sclera: Conjunctivae normal.  Neck:     Trachea: No tracheal deviation.  Cardiovascular:     Rate and Rhythm: Normal rate and regular rhythm.  Pulmonary:     Effort: Pulmonary effort is normal. No respiratory distress.     Breath sounds: Normal breath sounds. No stridor. No wheezing or rales.  Abdominal:     General: Bowel sounds are normal. There is no distension.     Palpations: Abdomen is soft.     Tenderness: There is no abdominal tenderness. There is no guarding or rebound.  Musculoskeletal:        General: No tenderness or deformity.     Cervical back: Neck supple.  Skin:    General: Skin is warm and dry.     Findings: No rash.  Neurological:     General: No focal deficit present.     Mental Status: She is alert.     Cranial Nerves: Cranial nerve deficit (Right-sided facial droop, patient is unable to speak aphasic, speech is garbled and incomprehensible) present.     Sensory: No sensory deficit.     Motor: Weakness present. No abnormal muscle tone or seizure activity.     Comments: Patient is unable to lift her arm off  the bed, she is able to lift both legs off the bed, she will lift her left hand and grips well with her left hand  Psychiatric:        Mood and Affect: Mood normal.    ED Results / Procedures / Treatments   Labs (all labs ordered are listed, but only abnormal results are displayed) Labs Reviewed  COMPREHENSIVE METABOLIC PANEL - Abnormal; Notable for the following components:  Result Value   CO2 21 (*)    Glucose, Bld 147 (*)    AST 48 (*)    All other components within normal limits  I-STAT CHEM 8, ED - Abnormal; Notable for the following components:   Glucose, Bld 143 (*)    All other components within normal limits  RESP PANEL BY RT-PCR (FLU A&B, COVID) ARPGX2  ETHANOL  PROTIME-INR  APTT  CBC  DIFFERENTIAL  RAPID URINE DRUG SCREEN, HOSP PERFORMED  URINALYSIS, ROUTINE W REFLEX MICROSCOPIC    EKG EKG Interpretation  Date/Time:  Tuesday December 25 2020 20:30:39 EST Ventricular Rate:  106 PR Interval:  202 QRS Duration: 93 QT Interval:  327 QTC Calculation: 435 R Axis:   69 Text Interpretation: Sinus tachycardia Consider right atrial enlargement Since last tracing rate faster Confirmed by Dorie Rank [phone]) on 12/25/2020 8:35:03 PM  Radiology CT ANGIO HEAD NECK W WO CM  Result Date: 12/25/2020 CLINICAL DATA:  Altered mental status EXAM: CT ANGIOGRAPHY HEAD AND NECK TECHNIQUE: Multidetector CT imaging of the head and neck was performed using the standard protocol during bolus administration of intravenous contrast. Multiplanar CT image reconstructions and MIPs were obtained to evaluate the vascular anatomy. Carotid stenosis measurements (when applicable) are obtained utilizing NASCET criteria, using the distal internal carotid diameter as the denominator. CONTRAST:  17mL OMNIPAQUE IOHEXOL 350 MG/ML SOLN COMPARISON:  No prior CTA, correlation is made with 08/12/2016 MRI and MRA head FINDINGS: CT HEAD FINDINGS Brain: Hypodensity in the left posterior frontal lobe cortex  and white matter (series 5, image 20). No acute hemorrhage, mass, mass effect, or midline shift. Parenchymal calcifications in the left temporal and posterior frontal lobe, which may be vascular calcifications. Vascular: No hyperdense vessel. Calcifications along the expected course of the left MCA. Skull: Normal. Negative for fracture or focal lesion. Sinuses: Small osteoma in the left ethmoid air cells. Otherwise negative. Orbits: Status post bilateral lens replacements. Review of the MIP images confirms the above findings CTA NECK FINDINGS Aortic arch: Standard branching. Imaged portion shows no evidence of aneurysm or dissection. No significant stenosis of the major arch vessel origins. Aortic atherosclerosis. Right carotid system: No evidence of dissection, stenosis (50% or greater) or occlusion. Left carotid system: No evidence of dissection, stenosis (50% or greater) or occlusion. Vertebral arteries: Codominant. No evidence of dissection, stenosis (50% or greater) or occlusion. Skeleton: No acute osseous abnormality. Degenerative changes in the cervical spine and right greater than left temporomandibular joints. Other neck: Subcentimeter right thyroid lobe nodule. Otherwise negative Upper chest: Right greater than left apical pleuroparenchymal scarring. No focal pulmonary opacity or pleural effusion. Review of the MIP images confirms the above findings CTA HEAD FINDINGS Anterior circulation: Both internal carotid arteries are patent to the termini, without stenosis or other abnormality. A1 segments patent, with multifocal narrowing of the right A1. Normal anterior communicating artery. Anterior cerebral arteries are patent to their distal aspects. No M1 stenosis or occlusion, although calcifications are noted in the left M1 segment. Normal MCA bifurcations. Distal MCA branches perfused with moderate irregularity bilaterally and calcifications in the left MCA branches. Posterior circulation: Vertebral arteries  patent to the vertebrobasilar junction without stenosis. Posterior inferior cerebral arteries patent bilaterally. Basilar patent to its distal aspect. Superior cerebellar arteries patent bilaterally. PCAs perfused to their distal aspects without focal stenosis. Venous sinuses: As permitted by contrast timing, patent. Anatomic variants: None significant. Review of the MIP images confirms the above findings IMPRESSION: 1. Hypodensity in the posterior left frontal lobe,  which is concerning for an acute or subacute infarct. 2. No intracranial large vessel occlusion. Multifocal calcifications in the left MCA, likely with multifocal stenosis. 3. Multifocal narrowing of the right A1 and bilateral distal MCA branches. 4. No hemodynamically significant stenosis in the neck. 5. Subcentimeter incidental thyroid nodule. No follow-up imaging is recommended. Reference: J Am Coll Radiol. 2015 Feb;12(2): 143-50 6. Aortic Atherosclerosis (ICD10-I70.0). These results were called by telephone at the time of interpretation on 12/25/2020 at 11:29 pm to provider [facility] , who verbally acknowledged these results. Electronically Signed   By: Merilyn Baba M.D.   On: 12/25/2020 23:29    Procedures Procedures   Medications Ordered in ED Medications  sodium chloride 0.9 % bolus 500 mL (0 mLs Intravenous Stopped 12/25/20 2145)    Followed by  0.9 %  sodium chloride infusion (100 mL/hr Intravenous New Bag/Given 12/25/20 2215)  LORazepam (ATIVAN) injection 0.5 mg (has no administration in time range)  iohexol (OMNIPAQUE) 350 MG/ML injection 100 mL (100 mLs Intravenous Contrast Given 12/25/20 2205)    ED Course  I have reviewed the triage vital signs and the nursing notes.  Pertinent labs & imaging results that were available during my care of the patient were reviewed by me and considered in my medical decision making (see chart for details).    MDM Rules/Calculators/A&P                          Initial presentation is  concerning for possible stroke versus hemorrhage.  She appears to have right-sided upper extremity weakness and aphasia. Patient CT scan does not show any signs of hemorrhage.  No large vessel occlusion.  Findings concerning for the possibility of acute stroke.  Patient will need MRI to evaluate further.  That is unavailable at this facility at this time of night.  Patient however is not a candidate for any intervention based on the duration of her symptoms.  I will consult with medical service for admission and further evaluation Final Clinical Impression(s) / ED Diagnoses Final diagnoses:  Cerebrovascular accident (CVA), unspecified mechanism ([place])       Dorie Rank, MD 12/26/20 0009

## 2020-12-25 NOTE — ED Triage Notes (Signed)
Rcems from home after being found in floor Covered in feces. Hot to the touch. Not responding initially. But now starting to talk. AMS.  Lives alone normally independent.   No history of CVA.

## 2020-12-25 NOTE — ED Notes (Signed)
Patient transported to CT 

## 2020-12-26 ENCOUNTER — Observation Stay (HOSPITAL_COMMUNITY): Payer: Medicare Other

## 2020-12-26 ENCOUNTER — Encounter (HOSPITAL_COMMUNITY): Payer: Self-pay | Admitting: Internal Medicine

## 2020-12-26 DIAGNOSIS — E785 Hyperlipidemia, unspecified: Secondary | ICD-10-CM | POA: Diagnosis not present

## 2020-12-26 DIAGNOSIS — I63512 Cerebral infarction due to unspecified occlusion or stenosis of left middle cerebral artery: Secondary | ICD-10-CM | POA: Diagnosis present

## 2020-12-26 DIAGNOSIS — Z90710 Acquired absence of both cervix and uterus: Secondary | ICD-10-CM | POA: Diagnosis not present

## 2020-12-26 DIAGNOSIS — I69392 Facial weakness following cerebral infarction: Secondary | ICD-10-CM | POA: Diagnosis not present

## 2020-12-26 DIAGNOSIS — G40909 Epilepsy, unspecified, not intractable, without status epilepticus: Secondary | ICD-10-CM | POA: Diagnosis present

## 2020-12-26 DIAGNOSIS — Z880 Allergy status to penicillin: Secondary | ICD-10-CM | POA: Diagnosis not present

## 2020-12-26 DIAGNOSIS — I10 Essential (primary) hypertension: Secondary | ICD-10-CM | POA: Diagnosis present

## 2020-12-26 DIAGNOSIS — K5909 Other constipation: Secondary | ICD-10-CM | POA: Diagnosis not present

## 2020-12-26 DIAGNOSIS — Z79899 Other long term (current) drug therapy: Secondary | ICD-10-CM | POA: Diagnosis not present

## 2020-12-26 DIAGNOSIS — I69320 Aphasia following cerebral infarction: Secondary | ICD-10-CM | POA: Diagnosis not present

## 2020-12-26 DIAGNOSIS — E782 Mixed hyperlipidemia: Secondary | ICD-10-CM

## 2020-12-26 DIAGNOSIS — R29810 Facial weakness: Secondary | ICD-10-CM | POA: Diagnosis present

## 2020-12-26 DIAGNOSIS — G936 Cerebral edema: Secondary | ICD-10-CM | POA: Diagnosis present

## 2020-12-26 DIAGNOSIS — K5901 Slow transit constipation: Secondary | ICD-10-CM | POA: Diagnosis not present

## 2020-12-26 DIAGNOSIS — R531 Weakness: Secondary | ICD-10-CM | POA: Diagnosis present

## 2020-12-26 DIAGNOSIS — Z7982 Long term (current) use of aspirin: Secondary | ICD-10-CM | POA: Diagnosis not present

## 2020-12-26 DIAGNOSIS — R29713 NIHSS score 13: Secondary | ICD-10-CM | POA: Diagnosis present

## 2020-12-26 DIAGNOSIS — R4701 Aphasia: Secondary | ICD-10-CM | POA: Diagnosis present

## 2020-12-26 DIAGNOSIS — I5032 Chronic diastolic (congestive) heart failure: Secondary | ICD-10-CM | POA: Diagnosis present

## 2020-12-26 DIAGNOSIS — G8321 Monoplegia of upper limb affecting right dominant side: Secondary | ICD-10-CM | POA: Diagnosis present

## 2020-12-26 DIAGNOSIS — I639 Cerebral infarction, unspecified: Secondary | ICD-10-CM | POA: Diagnosis not present

## 2020-12-26 DIAGNOSIS — R471 Dysarthria and anarthria: Secondary | ICD-10-CM | POA: Diagnosis present

## 2020-12-26 DIAGNOSIS — I110 Hypertensive heart disease with heart failure: Secondary | ICD-10-CM | POA: Diagnosis present

## 2020-12-26 DIAGNOSIS — R1310 Dysphagia, unspecified: Secondary | ICD-10-CM | POA: Diagnosis present

## 2020-12-26 DIAGNOSIS — R739 Hyperglycemia, unspecified: Secondary | ICD-10-CM | POA: Diagnosis present

## 2020-12-26 DIAGNOSIS — Z20822 Contact with and (suspected) exposure to covid-19: Secondary | ICD-10-CM | POA: Diagnosis present

## 2020-12-26 DIAGNOSIS — I69351 Hemiplegia and hemiparesis following cerebral infarction affecting right dominant side: Secondary | ICD-10-CM | POA: Diagnosis not present

## 2020-12-26 LAB — LIPID PANEL
Cholesterol: 135 mg/dL (ref 0–200)
HDL: 38 mg/dL — ABNORMAL LOW (ref >40)
LDL Cholesterol: 77 mg/dL (ref 0–99)
Total CHOL/HDL Ratio: 3.6 RATIO
Triglycerides: 102 mg/dL (ref <150)
VLDL: 20 mg/dL (ref 0–40)

## 2020-12-26 LAB — ECHOCARDIOGRAM COMPLETE
AR max vel: 2.8 cm2
AV Area VTI: 2.84 cm2
AV Area mean vel: 2.71 cm2
AV Mean grad: 4 mmHg
AV Peak grad: 7.7 mmHg
Ao pk vel: 1.39 m/s
Area-P 1/2: 4.49 cm2
Calc EF: 69.6 %
MV VTI: 3.73 cm2
S' Lateral: 2.4 cm
Single Plane A2C EF: 73.6 %
Single Plane A4C EF: 64.1 %

## 2020-12-26 LAB — HEMOGLOBIN A1C
Hgb A1c MFr Bld: 5.6 % (ref 4.8–5.6)
Mean Plasma Glucose: 114.02 mg/dL

## 2020-12-26 MED ORDER — LABETALOL HCL 5 MG/ML IV SOLN
5.0000 mg | INTRAVENOUS | Status: DC | PRN
  Administered 2020-12-28 – 2020-12-29 (×2): 5 mg via INTRAVENOUS
  Filled 2020-12-26 (×2): qty 4

## 2020-12-26 MED ORDER — ACETAMINOPHEN 325 MG PO TABS
650.0000 mg | ORAL_TABLET | ORAL | Status: DC | PRN
  Administered 2020-12-30: 19:00:00 650 mg via ORAL
  Filled 2020-12-26: qty 2

## 2020-12-26 MED ORDER — ROSUVASTATIN CALCIUM 20 MG PO TABS
20.0000 mg | ORAL_TABLET | Freq: Every day | ORAL | Status: DC
  Administered 2020-12-27 – 2021-01-02 (×7): 20 mg via ORAL
  Filled 2020-12-26 (×8): qty 1

## 2020-12-26 MED ORDER — SODIUM CHLORIDE 0.9 % IV SOLN: Freq: Once | INTRAVENOUS | Status: AC

## 2020-12-26 MED ORDER — ROSUVASTATIN CALCIUM 20 MG PO TABS
20.0000 mg | ORAL_TABLET | Freq: Every day | ORAL | Status: DC
  Filled 2020-12-26 (×3): qty 1

## 2020-12-26 MED ORDER — ACETAMINOPHEN 650 MG RE SUPP: 650.0000 mg | RECTAL | Status: DC | PRN

## 2020-12-26 MED ORDER — ACETAMINOPHEN 160 MG/5ML PO SOLN: 650.0000 mg | ORAL | Status: DC | PRN

## 2020-12-26 MED ORDER — LORAZEPAM 2 MG/ML IJ SOLN
0.5000 mg | Freq: Once | INTRAMUSCULAR | Status: AC
  Administered 2020-12-26: 0.5 mg via INTRAVENOUS
  Filled 2020-12-26: qty 1

## 2020-12-26 MED ORDER — STROKE: EARLY STAGES OF RECOVERY BOOK: Freq: Once | Status: AC

## 2020-12-26 MED ORDER — ASPIRIN 300 MG RE SUPP
300.0000 mg | Freq: Every day | RECTAL | Status: DC
  Filled 2020-12-26: qty 1

## 2020-12-26 NOTE — Consult Note (Addendum)
I connected with  Natasha Vazquez on 12/26/20 by a video enabled telemedicine application and verified that I am speaking with the correct person using two identifiers.   I discussed the limitations of evaluation and management by telemedicine. The patient expressed understanding and agreed to proceed.  Location of patient: [facility] Location of physician: [facility]  Neurology Consultation Reason for Consult: stroke Referring Physician: Dr Barb Merino  CC: right sided weakness  History is obtained from: Patient's daughter, chart review  HPI: Natasha Vazquez is a 70 y.o. female with past medical history of hypertension, hyperlipidemia who was brought in by EMS for right-sided weakness and dysarthria.  Patient daughter states that she spoke with patient on phone at around 7 PM on 01/01/2021. No one spoke or saw patient on 12/25/2020 until about 7:30 PM when patient's son went to check on her and found her on the floor.  She was confused, weak on the right side.  Stroke alert was not called as last known normal was over 24 hours.  MRI brain was performed which showed acute left MCA strokes.  Therefore neurology was consulted.  Per daughter, patient is not on aspirin due to history of GI bleed about 2 to 3 years ago.  Last known normal 7 pm on 12/24/2020 BP on arrival: 162/88 tPA not administered as patient outside for half hour window Patient was not a candidate for thrombectomy due to no large vessel occlusion  NIHSS  13   1A: Level of consciousness --> 0 = Alert; keenly responsive 1B: Ask month and age --> 2 = Aphasic 1C: 'Blink eyes' & 'squeeze hands' --> 2 = Performs 0 tasks 2: Horizontal extraocular movements --> 0 = Normal 3: Visual fields --> 0 = No visual loss 4: Facial palsy --> 2 = Partial paralysis (lower face) 5A: Left arm motor drift --> 0 = No drift for 10 seconds 5B: Right arm motor drift --> 3 = No effort against gravity 6A: Left leg motor drift  --> 0 = No drift for 5 seconds 6B: Right leg motor drift --> 0 = No drift for 5 seconds 7: Limb Ataxia --> 0 = Does not understand 8: Sensation --> 1 = Mild-moderate loss: less sharp/more dull  9: Language/aphasia --> 3 = Mute/global aphasia: no usable speech/auditory comprehension 10: Dysarthria --> 0 = Normal 11: Extinction/inattention --> 0 = No abnormality   ROS: Unable to obtain due to aphasia  Past Medical History:  Diagnosis Date   Hypertension    TMJ (dislocation of temporomandibular joint)    Family history: Patient denies any family history of stroke  Social History:  reports that she has never smoked. She has never used smokeless tobacco. She reports that she does not drink alcohol and does not use drugs.   Medications Prior to Admission  Medication Sig Dispense Refill Last Dose   amLODipine (NORVASC) 2.5 MG tablet Take 2.5 mg by mouth daily.   12/25/2020   carboxymethylcellulose (REFRESH PLUS) 0.5 % SOLN Place 1 drop into both eyes in the morning and at bedtime.   12/25/2020   cholecalciferol (VITAMIN D3) 25 MCG (1000 UNIT) tablet Take 1,000 Units by mouth daily.   12/25/2020   cycloSPORINE (RESTASIS) 0.05 % ophthalmic emulsion Place 1 drop into both eyes 2 (two) times daily.   12/25/2020   losartan (COZAAR) 100 MG tablet Take 100 mg by mouth daily.  1 12/25/2020   Meclizine HCl 25 MG CHEW Chew 1 tablet by mouth  daily as needed (dizziness).   PRN   meloxicam (MOBIC) 15 MG tablet Take 15 mg by mouth daily as needed (hip pain.).   Past Week   metroNIDAZOLE (METROCREAM) 0.75 % cream Apply 1 application topically daily.   12/25/2020   Multiple Vitamins-Minerals (PRESERVISION AREDS 2 PO) Take 1 tablet by mouth in the morning and at bedtime.   12/25/2020   Omega-3 Fatty Acids (FISH OIL) 1000 MG CAPS Take 1 capsule by mouth daily.   12/25/2020   Propylene Glycol (SYSTANE COMPLETE) 0.6 % SOLN Place 1 tablet into both eyes in the morning and at bedtime.   12/25/2020    rosuvastatin (CRESTOR) 5 MG tablet Take 5 mg by mouth every other day. In the morning   12/25/2020   ibuprofen (ADVIL) 800 MG tablet Take 1 tablet (800 mg total) by mouth every 8 (eight) hours as needed. Take with food (Patient not taking: Reported on 12/26/2020) 90 tablet 0 Not Taking   oxyCODONE (ROXICODONE) 5 MG immediate release tablet Take 1 tablet (5 mg total) by mouth every 6 (six) hours as needed. Alternate each dose with tylenol/ibuprofen (Patient not taking: Reported on 12/26/2020) 30 tablet 0 Not Taking      Exam: Current vital signs: BP (!) 149/77   Pulse 99   Temp 98.1 F (36.7 C)   Resp 20   SpO2 100%  Vital signs in last 24 hours: Temp:  [97.9 F (36.6 C)-98.3 F (36.8 C)] 98.1 F (36.7 C) (11/23 1338) Pulse Rate:  [90-126] 99 (11/23 1338) Resp:  [17-31] 20 (11/23 1338) BP: (136-183)/(70-101) 149/77 (11/23 1338) SpO2:  [97 %-100 %] 100 % (11/23 1338)   Physical Exam  Constitutional: Appears well-developed and well-nourished.  Psych: Affect appropriate to situation  Respiratory: Effort normal Neuro: Awake, alert, keeps repeating "Ya", unable to follow commands, unable to name objects, unable to repeat (receptive and expressive aphasia), PERRLA, EOMI, blinks to threat bilaterally, right facial droop, antigravity strength without drift in left upper extremity, no movement in right upper extremity, antigravity strength without drift in bilateral lower extremities, decreased sensation in left right upper extremity  I have reviewed labs in epic and the results pertinent to this consultation are: CBC:  Recent Labs  Lab 12/25/20 2035 12/25/20 2058  WBC 8.0  --   NEUTROABS 5.6  --   HGB 12.9 13.6  HCT 38.0 40.0  MCV 96.7  --   PLT 333  --     Basic Metabolic Panel:  Lab Results  Component Value Date   NA 140 12/25/2020   K 4.0 12/25/2020   CO2 21 (L) 12/25/2020   GLUCOSE 143 (H) 12/25/2020   BUN 18 12/25/2020   CREATININE 0.70 12/25/2020   CALCIUM 9.6  12/25/2020   GFRNONAA >60 12/25/2020   GFRAA >60 09/16/2016   Lipid Panel:  Lab Results  Component Value Date   LDLCALC 77 12/26/2020   HgbA1c:  Lab Results  Component Value Date   HGBA1C 5.6 12/26/2020   Urine Drug Screen: No results found for: LABOPIA, [place], LABBENZ, AMPHETMU, THCU, LABBARB  Alcohol Level     Component Value Date/Time   ETH <10 12/25/2020 2035     I have reviewed the images obtained:  MR Brain wo contrast 12/26/2020: Extensive acute infarcts throughout the left cerebrum, including left frontal, parietal, and temporal lobes and left basal ganglia. Mild petechial hemorrhage without mass occupying hemorrhagic transformation. Edema without significant mass effect. Moderate chronic microvascular ischemic disease.     CTA  H/N 12/25/2020: No intracranial large vessel occlusion. Multifocal calcifications in the left MCA, likely with multifocal stenosis. Multifocal narrowing of the right A1 and bilateral distal MCA branches. No hemodynamically significant stenosis in the neck.     ASSESSMENT/PLAN: 70 year old female presented with right upper extremity weakness and dysarthria.  MRI brain showed acute ischemic stroke  Acute ischemic left MCA stroke -Etiology of stroke: Likely due to hypotension in the setting of severe left MCA stenosis vs cardioembolic -Risk factors: Hypertension, hyperlipidemia -TTE 12/26/2020: Left ventricle ejection fraction 65 to 70%, no regional wall motion abnormalities, grade 1 diastolic dysfunction, no atrial level shunt -A1c 5.6, LDL 77  Recommendations: -Ideally patient will need to be on dual antiplatelets, aspirin 325 mg and Plavix 75 mg daily for 3 monthd followed by aspirin monotherapy per SAMPRISS trial.   -However, patient's daughter reports patient has had a GI bleed in the past.  Therefore would recommend GI consult to see if it is safe to put patient on dual antiplatelets?  Additionally, we would also recommend 30-day  event monitor at the time of discharge to look for paroxysmal A. fib.  Please discuss with GI in case we find atrial fibrillation, will it be safe to start patient on anticoagulation?  -In the interim, we will continue patient on aspirin suppository 300 mg daily -Recommend starting rosuvastatin 20 mg daily for secondary stroke prevention -It has already been more than 24 hours since stroke, therefore recommend goal SBP 120-140, avoid hypotension due to significant intracranial stenosis - 30 day event monitor  - q4 hr neuro checks -Patient has some cerebral edema, therefore would recommend STAT head CT for any change in neuro exam - Tele - PT/OT/SLP - Stroke education  Thank you for allowing [place] to participate in the care of this patient. If you have any further questions, please contact  me or neurohospitalist.   Zeb Comfort [facility]ile[facility] neurohospitalist
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
## 2020-12-26 NOTE — Progress Notes (Signed)
Patient seen and examined.  Admitted early morning hours by nighttime hospitalist with acute dense right upper extremity hemiplegia.  Patient is extremely dysarthric and unable to have conversation but she understands. Son and daughter at the bedside.  Patient lives home by herself has history of hypertension hyperlipidemia but no history of stroke or seizure.  Patient was seen by her children day before, her daughter had talked to her on the phone 9 PM at night on 11/21.  Her son found her on the floor, not responding likely unable to talk or express any concerns on 11/22, covered with feces.  Brought to the emergency room.  Evidence of right-sided hemiplegia. Not a tPA candidate because of unknown duration of symptoms. Not an intervention candidate, CT angiogram with no evidence of large vessel occlusion.  Acute ischemic stroke, left sided multiple territory stroke: Clinical findings, unresponsiveness but likely unable to express, depends right upper extremity hemiplegia and right-sided facial droop. CT head findings, hypodense area left cerebrum. MRI of the brain, extensive left ACA and MCA territory infarct.  No mass-effect. CTA of the head and neck, no large vessel occlusion but multiple intracranial stenosis. 2D echocardiogram, essentially normal.  Normal ejection fraction.  No thrombosis. Antiplatelet therapy, none at home. LDL pending. Hemoglobin A1c, pending. DVT prophylaxis, SCDs.   Called and discussed with neurology for consultation.  Updated patient's family at the bedside.  Patient with acute massive left-sided ischemic stroke.  Stroke work-up to be completed.  PT OT will see.  Will need aggressive rehab. Will start patient on rectal aspirin as she is not safe to swallow.  To be seen by speech therapy.  Continue maintenance IV fluids. Permissive hypertension. Due to severity of condition, patient will need to be admitted to the hospital and undergo inpatient treatment, therapies and  rehab.

## 2020-12-26 NOTE — Plan of Care (Addendum)
  Problem: Acute Rehab PT Goals(only PT should resolve) Goal: Pt Will Go Supine/Side To Sit Outcome: Progressing Flowsheets (Taken 12/26/2020 1419) Pt will go Supine/Side to Sit: with minimal assist Goal: Patient Will Transfer Sit To/From Stand Outcome: Progressing Flowsheets (Taken 12/26/2020 1419) Patient will transfer sit to/from stand: with min guard assist Goal: Pt Will Transfer Bed To Chair/Chair To Bed Outcome: Progressing Flowsheets (Taken 12/26/2020 1420) Pt will Transfer Bed to Chair/Chair to Bed: with min assist Goal: Pt Will Ambulate Outcome: Progressing Flowsheets (Taken 12/26/2020 1420) Pt will Ambulate:  with cane  with minimal assist  50 feet  75 feet    Cassie Jones, SPT  During this treatment session, the therapist was present, participating in and directing the treatment.  3:20 PM, 12/26/20 Lonell Grandchild, MPT Physical Therapist with [facility] 336 [phone] office [phone] mobile phone

## 2020-12-26 NOTE — Evaluation (Signed)
Clinical/Bedside Swallow Evaluation Patient Details  Name: Natasha Vazquez MRN: [id number] Date of Birth: 07-30-1950  Today's Date: 12/26/2020 Time: SLP Start Time (ACUTE ONLY): 1536 SLP Stop Time (ACUTE ONLY): 1600 SLP Time Calculation (min) (ACUTE ONLY): 24 min  Past Medical History:  Past Medical History:  Diagnosis Date   Hypertension    TMJ (dislocation of temporomandibular joint)    Past Surgical History:  Past Surgical History:  Procedure Laterality Date   ABDOMINAL HYSTERECTOMY     CHOLECYSTECTOMY     IR ANGIO INTRA EXTRACRAN SEL INTERNAL CAROTID BILAT MOD SED  09/16/2016   IR ANGIO VERTEBRAL SEL VERTEBRAL BILAT MOD SED  09/16/2016   MASS EXCISION Left 02/10/2020   Procedure: excision of soft tissue mass left upper back;  Surgeon: Jesusita Oka, MD;  Location: [facility];  Service: General;  Laterality: Left;   HPI:  Natasha Vazquez is a 70 y.o. female with medical history significant for hypertension, hyperlipidemia who presents to the emergency department via EMS due to altered mental status.  Patient was unable to provide history, history was obtained from ED physician and ED medical record as well as from son and daughter at bedside.  Per report, patient was last seen normal on 11/21 around 2 PM, son states that patient was seen on the floor, it was unknown how long she has been on the floor.  She was not responding, EMS was activated and patient was taken to the ED for further evaluation and management. 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  Mild petechial hemorrhage without mass occupying hemorrhagic  transformation. Edema without significant mass effect. Moderate chronic microvascular ischemic disease. BSE/SLE requested.    Assessment / Plan / Recommendation  Clinical Impression  Clinical swallow evaluation completed while Pt seated upright in recliner  with Pt's brother present. Pt presents with moderate right facial asymmetry with reduced labial closure and poor awareness. She is coughing with thin liquids and is impulsive with self feeding. Pt with improved performance with nectar-thick liquids, puree, and solids. Pt also presents with receptive and expressive aphasia with limited appropriate verbal responses. She is able to follow simple commands in context (gestures). Recommend D2/chopped and NTL with po medications whole in puree with SLP to follow for dysphagia and MBSS when appropriate, SLE as well. SLP Visit Diagnosis: Dysphagia, unspecified (R13.10)    Aspiration Risk  Moderate aspiration risk    Diet Recommendation Dysphagia 2 (Fine chop);Nectar-thick liquid   Liquid Administration via: Cup;No straw Medication Administration: Whole meds with puree Supervision: Staff to assist with self feeding;Full supervision/cueing for compensatory strategies Compensations: Slow rate;Small sips/bites Postural Changes: Seated upright at 90 degrees;Remain upright for at least 30 minutes after po intake    Other  Recommendations Oral Care Recommendations: Oral care BID;Staff/trained caregiver to provide oral care Other Recommendations: Order thickener from pharmacy;Clarify dietary restrictions;Prohibited food (jello, ice cream, thin soups)    Recommendations for follow up therapy are one component of a multi-disciplinary discharge planning process, led by the attending physician.  Recommendations may be updated based on patient status, additional functional criteria and insurance authorization.  Follow up Recommendations Acute inpatient rehab (3hours/day)      Assistance Recommended at Discharge Frequent or constant Supervision/Assistance  Functional Status Assessment Patient has had a recent decline in their functional status and demonstrates the ability to make significant improvements in function in a reasonable and predictable amount  of time.   Frequency and Duration min 2x/week  1 week       Prognosis Prognosis for Safe Diet Advancement: Fair Barriers to Reach Goals: Severity of deficits;Language deficits      Swallow Study   General Date of Onset: 12/25/20 HPI: Natasha Vazquez is a 70 y.o. female with medical history significant for hypertension, hyperlipidemia who presents to the emergency department via EMS due to altered mental status.  Patient was unable to provide history, history was obtained from ED physician and ED medical record as well as from son and daughter at bedside.  Per report, patient was last seen normal on 11/21 around 2 PM, son states that patient was seen on the floor, it was unknown how long she has been on the floor.  She was not responding, EMS was activated and patient was taken to the ED for further evaluation and management. 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  Mild petechial hemorrhage without mass occupying hemorrhagic  transformation. Edema without significant mass effect. Moderate chronic microvascular ischemic disease. BSE/SLE requested. Type of Study: Bedside Swallow Evaluation Previous Swallow Assessment: n/a Diet Prior to this Study: NPO Temperature Spikes Noted: No Respiratory Status: Room air History of Recent Intubation: No Behavior/Cognition: Alert;Cooperative;Pleasant mood;Requires cueing;Doesn't follow directions Oral Cavity Assessment: Within Functional Limits Oral Care Completed by SLP: Recent completion by staff Oral Cavity - Dentition: Adequate natural dentition;Missing dentition Vision: Functional for self-feeding Self-Feeding Abilities: Able to feed self;Other (Comment) (impulsive) Patient Positioning: Upright in chair Baseline Vocal Quality: Normal Volitional Cough: Strong Volitional Swallow: Unable to elicit    Oral/Motor/Sensory Function Overall Oral Motor/Sensory  Function: Moderate impairment Facial ROM: Reduced right;Suspected CN VII (facial) dysfunction Facial Symmetry: Abnormal symmetry right;Suspected CN VII (facial) dysfunction Facial Strength: Reduced right Facial Sensation: Reduced right Lingual ROM: Within Functional Limits Lingual Symmetry: Within Functional Limits Lingual Strength: Within Functional Limits Lingual Sensation: Within Functional Limits Velum:  (could not visualize) Mandible: Within Functional Limits   Ice Chips Ice chips: Impaired Presentation: Spoon Oral Phase Impairments: Reduced labial seal Pharyngeal Phase Impairments: Cough - Delayed   Thin Liquid Thin Liquid: Impaired Presentation: Cup;Spoon;Self Fed Oral Phase Impairments: Reduced labial seal Oral Phase Functional Implications: Right anterior spillage Pharyngeal  Phase Impairments: Suspected delayed Swallow;Multiple swallows;Cough - Immediate    Nectar Thick Nectar Thick Liquid: Impaired Presentation: Cup;Self Fed;Spoon Oral Phase Impairments: Reduced labial seal Pharyngeal Phase Impairments: Suspected delayed Swallow   Honey Thick Honey Thick Liquid: Not tested   Puree Puree: Impaired Presentation: Spoon Oral Phase Impairments: Reduced labial seal   Solid     Solid: Within functional limits Presentation: Self Fed (large bite size, impulsive)     Thank you,  Genene Churn, [place]  Demoni Gergen 12/26/2020,4:12 PM

## 2020-12-26 NOTE — ED Notes (Signed)
Pt is agitated and trying to get out of the bed. Notified edp

## 2020-12-26 NOTE — Evaluation (Addendum)
Physical Therapy Evaluation Patient Details Name: NAUTICA HOTZ MRN: [id number] DOB: [birth year] Today's Date: 12/26/2020  History of Present Illness  Ayisha L Garciagarcia is a 70 y.o. female with medical history significant for hypertension, hyperlipidemia who presents to the emergency department via EMS due to altered mental status.  Patient was unable to provide history, history was obtained from ED physician and ED medical record as well as from son and daughter at bedside.  Per report, patient was last seen normal on 11/21 around 2 PM, son states that patient was seen on the floor, it was unknown how long she has been on the floor.  She was not responding, EMS was activated and patient was taken to the ED for further evaluation and management.   Clinical Impression  Patient presents in bed awake, alert, and agreeable for therapy w/ brother and daughter at bedside. Patient performed bed mobility, demonstrated slow labored movement, R UE neglect, required physical assist to support back and move R UE during movement, required verbal, visual, and tactile cuing for good LE placement. Patient attempts to follow commands w/out hesitation, demonstrates occasional difficulty accurately following instructions. Patient performed a sit to stand transfer, demonstrated slow movement used L hand on bed for completion of task, required Min assist to stand due to R UE neglect and R LE weakness. Patient is able to side step to her L at bedside w/ hand held assist and Min assist to maintain stability. Patient required max verbal cuing and Mod tactile cuing for side stepping to her R side due to R side neglect and weakness. Patient ambulated in hallway using a quad cane, demonstrated a slow cadence and difficulty clearing her R foot due to decreased dorsiflexion and weakness on R side. Patient demonstrated good carryover immediately following cues for increased step length on R side. Patient will benefit from continued  skilled physical therapy in hospital and recommended venue below to increase strength, balance, endurance for safe ADLs and gait.        Recommendations for follow up therapy are one component of a multi-disciplinary discharge planning process, led by the attending physician.  Recommendations may be updated based on patient status, additional functional criteria and insurance authorization.  Follow Up Recommendations Acute inpatient rehab (3hours/day)    Assistance Recommended at Discharge Frequent or constant Supervision/Assistance  Functional Status Assessment Patient has had a recent decline in their functional status and demonstrates the ability to make significant improvements in function in a reasonable and predictable amount of time.  Equipment Recommendations  None recommended by PT    Recommendations for Other Services       Precautions / Restrictions Precautions Precautions: Fall Restrictions Weight Bearing Restrictions: No      Mobility  Bed Mobility Overal bed mobility: Needs Assistance Bed Mobility: Supine to Sit     Supine to sit: Mod assist     General bed mobility comments: Demonstrated slow labored movement, R UE neglect, required physical assist to move R UE and support back during movement, required verbal, visual, and tactile cuing for good  LE placement.    Transfers Overall transfer level: Needs assistance   Transfers: Sit to/from Stand;Bed to chair/wheelchair/BSC Sit to Stand: Min assist   Step pivot transfers: Mod assist       General transfer comment: Demonstrated slow movement used L hand on bed for completion of sit to stand transfer, required physical assist to stand due to R UE neglect and R LE weakness. Patient took side steps  at bedside to her left w/ Min assist to maintain stability. Patient required Max verbal cuing and Mod tactile cuing for side stepping to her R side.    Ambulation/Gait Ambulation/Gait assistance: Mod assist Gait  Distance (Feet): 45 Feet Assistive device: Quad cane Gait Pattern/deviations: Step-to pattern;Decreased step length - right;Decreased step length - left;Decreased stride length;Knee flexed in stance - right;Shuffle Gait velocity: Decreased     General Gait Details: Demonstrated a slow cadence, decreased clearance of R foot especially w/ turning, shuffling of feet w/ a step-to pattern on R side.  Stairs            Wheelchair Mobility    Modified Rankin (Stroke Patients Only)       Balance Overall balance assessment: Needs assistance Sitting-balance support: Single extremity supported;Feet supported Sitting balance-Leahy Scale: Fair Sitting balance - Comments: fair/good seated at EOB   Standing balance support: Single extremity supported;During functional activity;Reliant on assistive device for balance Standing balance-Leahy Scale: Poor Standing balance comment: poor/fair using quad cane                             Pertinent Vitals/Pain Pain Assessment: Faces Faces Pain Scale: No hurt    Home Living Family/patient expects to be discharged to:: Private residence Living Arrangements: Alone Available Help at Discharge: Family;Other (Comment);Available PRN/intermittently (Brother and daughter available as needed) Type of Home: House Home Access: Stairs to enter Entrance Stairs-Rails: Left Entrance Stairs-Number of Steps: 4-5   Home Layout: One level;Able to live on main level with bedroom/bathroom Home Equipment: None      Prior Function Prior Level of Function : Independent/Modified Independent             Mobility Comments: Independent household and community ambulator, does not drive. ADLs Comments: Independent w/ ADL's.     Hand Dominance        Extremity/Trunk Assessment   Upper Extremity Assessment Upper Extremity Assessment: Defer to OT evaluation;RUE deficits/detail RUE Deficits / Details: Demonstrates R UE neglect, unable to  squeezed/move R hand w/ verbal, visual, and tactile cuing.    Lower Extremity Assessment Lower Extremity Assessment: Generalized weakness;RLE deficits/detail RLE Deficits / Details: Decreased dorsiflexion, shuffling/step-to pattern on R side.       Communication   Communication: Expressive difficulties;Other (comment) (Patient did not speak. Family reports patient has not spoken at all since her stroke.)  Cognition Arousal/Alertness: Awake/alert Behavior During Therapy: WFL for tasks assessed/performed Overall Cognitive Status: Within Functional Limits for tasks assessed                                          General Comments      Exercises     Assessment/Plan    PT Assessment Patient needs continued PT services  PT Problem List Decreased strength;Decreased mobility;Decreased safety awareness;Decreased range of motion;Decreased coordination;Decreased activity tolerance;Decreased balance;Decreased knowledge of use of DME       PT Treatment Interventions DME instruction;Therapeutic exercise;Gait training;Stair training;Balance training;Neuromuscular re-education;Functional mobility training;Therapeutic activities;Patient/family education    PT Goals (Current goals can be found in the Care Plan section)  Acute Rehab PT Goals Patient Stated Goal: Return home after Rehab. PT Goal Formulation: With patient/family Time For Goal Achievement: 01/09/21 Potential to Achieve Goals: Good    Frequency Min 4X/week   Barriers to discharge  Co-evaluation               AM-PAC PT "6 Clicks" Mobility  Outcome Measure Help needed turning from your back to your side while in a flat bed without using bedrails?: A Lot Help needed moving from lying on your back to sitting on the side of a flat bed without using bedrails?: A Lot Help needed moving to and from a bed to a chair (including a wheelchair)?: A Lot Help needed standing up from a chair using your arms  (e.g., wheelchair or bedside chair)?: A Little Help needed to walk in hospital room?: A Lot Help needed climbing 3-5 steps with a railing? : A Lot 6 Click Score: 13    End of Session Equipment Utilized During Treatment: Gait belt Activity Tolerance: Patient tolerated treatment well;Patient limited by fatigue Patient left: in bed;with call bell/phone within reach;with family/visitor present Nurse Communication: Mobility status PT Visit Diagnosis: Unsteadiness on feet (R26.81);Other abnormalities of gait and mobility (R26.89);Muscle weakness (generalized) (M62.81)    Time: 2637-8588 PT Time Calculation (min) (ACUTE ONLY): 26 min   Charges:   PT Evaluation $PT Eval Moderate Complexity: 1 Mod PT Treatments $Therapeutic Activity: 23-37 mins        Cassie Jones, SPT  During this treatment session, the therapist was present, participating in and directing the treatment.  3:17 PM, 12/26/20 Lonell Grandchild, MPT Physical Therapist with [facility] [phone] office [phone] mobile phone

## 2020-12-26 NOTE — H&P (Signed)
History and Physical  KHAMYA TOPP GHW:[id number] DOB: [birth year] DOA: 12/25/2020  Referring physician: Dorie Rank, MD  PCP: Carol Ada, MD  Patient coming from: Home  Chief Complaint: Altered mental status  HPI: DORATHEA FAERBER is a 70 y.o. female with medical history significant for hypertension, hyperlipidemia who presents to the emergency department via EMS due to altered mental status.  Patient was unable to provide history, history was obtained from ED physician and ED medical record as well as from son and daughter at bedside.  Per report, patient was last seen normal on 11/21 around 2 PM, son states that patient was seen on the floor, it was unknown how long she has been on the floor.  She was not responding, EMS was activated and patient was taken to the ED for further evaluation and management.  ED Course:  In the emergency department, she was tachycardic, BP was elevated at 178/73..  Work-up in the ED showed normal CBC, normal BMP except for hyperglycemia.  AST 48, alcohol level was negative.  Influenza A, B, SARS coronavirus 2 was negative. CT angiography of head and neck showed hypodensity in the posterior left frontal lobe, which is concerning for an acute or subacute infarct. No intracranial large vessel occlusion. Ativan was given, IV hydration was provided.  Hospitalist was asked to admit patient for further evaluation and management.  Review of Systems: This cannot be obtained at this time due to patient having aphasia  Past Medical History:  Diagnosis Date   Hypertension    TMJ (dislocation of temporomandibular joint)    Past Surgical History:  Procedure Laterality Date   ABDOMINAL HYSTERECTOMY     CHOLECYSTECTOMY     IR ANGIO INTRA EXTRACRAN SEL INTERNAL CAROTID BILAT MOD SED  09/16/2016   IR ANGIO VERTEBRAL SEL VERTEBRAL BILAT MOD SED  09/16/2016   MASS EXCISION Left 02/10/2020   Procedure: excision of soft tissue mass left upper back;  Surgeon: Jesusita Oka, MD;  Location: [place];  Service: General;  Laterality: Left;    Social History:  reports that she has never smoked. She has never used smokeless tobacco. She reports that she does not drink alcohol and does not use drugs.   Allergies  Allergen Reactions   Aspirin Other (See Comments)    Had a GI bleed   Penicillins Rash and Other (See Comments)    Has patient had a PCN reaction causing immediate rash, facial/tongue/throat swelling, SOB or lightheadedness with hypotension: Yes Has patient had a PCN reaction causing severe rash involving mucus membranes or skin necrosis: No Has patient had a PCN reaction that required hospitalization: Unknown Has patient had a PCN reaction occurring within the last 10 years: No If all of the above answers are "NO", then may proceed with Cephalosporin use.     History reviewed. No pertinent family history.   Prior to Admission medications   Medication Sig Start Date End Date Taking? Authorizing Provider  metroNIDAZOLE (METROGEL) 1 % gel 1 application 7/89/38  Yes [provider]  amLODipine (NORVASC) 2.5 MG tablet Take 2.5 mg by mouth daily. 11/10/19   [provider]  amLODipine (NORVASC) 2.5 MG tablet Take 1 tablet by mouth daily.    [provider]  carboxymethylcellulose (REFRESH PLUS) 0.5 % SOLN Place 1 drop into both eyes in the morning and at bedtime.    [provider]  cholecalciferol (VITAMIN D3) 25 MCG (1000 UNIT) tablet 1 tablet    [provider]  cycloSPORINE (RESTASIS) 0.05 % ophthalmic emulsion 1 drop into affected eye    [provider]  ibuprofen (ADVIL) 800 MG tablet Take 1 tablet (800 mg total) by mouth every 8 (eight) hours as needed. Take with food 02/10/20   Jesusita Oka, MD  losartan (COZAAR) 100 MG tablet Take 100 mg by mouth daily. 05/15/16   [provider]  losartan (COZAAR) 100 MG tablet Take 1 tablet by mouth daily.    [provider]  Meclizine HCl 25 MG CHEW 1 tablet as needed    [provider]  meloxicam (MOBIC) 15 MG tablet Take 15 mg by mouth daily as needed (hip pain.). 12/19/19   [provider]  metroNIDAZOLE (METROCREAM) 0.75 % cream Apply 1 application topically daily. 12/03/19   [provider]  Multiple Vitamins-Minerals (PRESERVISION AREDS 2 PO) Take 1 tablet by mouth in the morning and at bedtime.    [provider]  Multiple Vitamins-Minerals (PRESERVISION AREDS) TABS See admin instructions.    [provider]  Omega-3 Fatty Acids (FISH OIL PO) Take 2,400 mg by mouth in the morning and at bedtime.    [provider]  Omega-3 Fatty Acids (FISH OIL) 1000 MG CAPS Take 1 capsule by mouth daily.    [provider]  oxyCODONE (ROXICODONE) 5 MG immediate release tablet Take 1 tablet (5 mg total) by mouth every 6 (six) hours as needed. Alternate each dose with tylenol/ibuprofen 02/10/20   Jesusita Oka, MD  Polyvinyl Alcohol-Povidone PF (REFRESH) 1.4-0.6 % SOLN both eyes    [provider]  Propylene Glycol (SYSTANE COMPLETE) 0.6 % SOLN Place 1 tablet into both eyes in the morning and at bedtime.    [provider]  RESTASIS 0.05 % ophthalmic emulsion Place 1 drop into both eyes 2 (two) times daily. 12/27/19   [provider]  rosuvastatin (CRESTOR) 5 MG tablet Take 5 mg by mouth every other day. In the morning 01/12/20   [provider]    Physical Exam: BP (!) 162/82 (BP Location: Left Arm)   Pulse 97   Temp 98.1 F (36.7 C) (Oral)   Resp 20   SpO2 100%   General: 70 y.o. year-old female well developed well nourished in no acute distress.  HEENT: NCAT, EOMI Neck: Supple, trachea medial Cardiovascular: Tachycardia.  Regular rate and rhythm with no rubs or gallops.  No thyromegaly or JVD noted.  No lower extremity edema. 2/4 pulses in all 4 extremities. Respiratory: Clear to auscultation with no wheezes or  rales. Good inspiratory effort. Abdomen: Soft, nontender nondistended with normal bowel sounds x4 quadrants. Muskuloskeletal: No cyanosis, clubbing or edema noted bilaterally Lymphatics: No axillary or supraclavicular adenopathy Neuro: Patient was aphasic, right-sided facial droop.  She was unable to lift right hand off the bed.  She was unable to perform right heel-to-shin movements and finger-to-nose movements.  NIHSS  7  (facial palsy-Minor paralysis-1 , motor arm-drift -some effort against gravity-2, motor leg-drift 1, aphasia-mute- 3) Skin: No ulcerative lesions noted or rashes Psychiatry: Mood is appropriate for condition and setting          Labs on Admission:  Basic Metabolic Panel: Recent Labs  Lab 12/25/20 2035 12/25/20 2058  NA 135 140  K 4.1 4.0  CL 103 105  CO2 21*  --   GLUCOSE 147* 143*  BUN 19 18  CREATININE 0.75 0.70  CALCIUM 9.6  --    Liver Function Tests: Recent Labs  Lab 12/25/20 2035  AST 48*  ALT 37  ALKPHOS 112  BILITOT 0.7  PROT 8.0  ALBUMIN 4.4   No results for input(s): LIPASE, AMYLASE in the last 168 hours. No results for input(s): AMMONIA in the last 168 hours. CBC: Recent Labs  Lab 12/25/20 2035 12/25/20 2058  WBC 8.0  --   NEUTROABS 5.6  --   HGB 12.9 13.6  HCT 38.0 40.0  MCV 96.7  --   PLT 333  --    Cardiac Enzymes: No results for input(s): CKTOTAL, CKMB, CKMBINDEX, TROPONINI in the last 168 hours.  BNP (last 3 results) No results for input(s): BNP in the last 8760 hours.  ProBNP (last 3 results) No results for input(s): PROBNP in the last 8760 hours.  CBG: No results for input(s): GLUCAP in the last 168 hours.  Radiological Exams on Admission: CT ANGIO HEAD NECK W WO CM  Result Date: 12/25/2020 CLINICAL DATA:  Altered mental status EXAM: CT ANGIOGRAPHY HEAD AND NECK TECHNIQUE: Multidetector CT imaging of the head and neck was performed using the standard protocol during bolus administration of intravenous contrast.  Multiplanar CT image reconstructions and MIPs were obtained to evaluate the vascular anatomy. Carotid stenosis measurements (when applicable) are obtained utilizing NASCET criteria, using the distal internal carotid diameter as the denominator. CONTRAST:  150mL OMNIPAQUE IOHEXOL 350 MG/ML SOLN COMPARISON:  No prior CTA, correlation is made with 08/12/2016 MRI and MRA head FINDINGS: CT HEAD FINDINGS Brain: Hypodensity in the left posterior frontal lobe cortex and white matter (series 5, image 20). No acute hemorrhage, mass, mass effect, or midline shift. Parenchymal calcifications in the left temporal and posterior frontal lobe, which may be vascular calcifications. Vascular: No hyperdense vessel. Calcifications along the expected course of the left MCA. Skull: Normal. Negative for fracture or focal lesion. Sinuses: Small osteoma in the left ethmoid air cells. Otherwise negative. Orbits: Status post bilateral lens replacements. Review of the MIP images confirms the above findings CTA NECK FINDINGS Aortic arch: Standard branching. Imaged portion shows no evidence of aneurysm or dissection. No significant stenosis of the major arch vessel origins. Aortic atherosclerosis. Right carotid system: No evidence of dissection, stenosis (50% or greater) or occlusion. Left carotid system: No evidence of dissection, stenosis (50% or greater) or occlusion. Vertebral arteries: Codominant. No evidence of dissection, stenosis (50% or greater) or occlusion. Skeleton: No acute osseous abnormality. Degenerative changes in the cervical spine and right greater than left temporomandibular joints. Other neck: Subcentimeter right thyroid lobe nodule. Otherwise negative Upper chest: Right greater than left apical pleuroparenchymal scarring. No focal pulmonary opacity or pleural effusion. Review of the MIP images confirms the above findings CTA HEAD FINDINGS Anterior circulation: Both internal carotid arteries are patent to the termini,  without stenosis or other abnormality. A1 segments patent, with multifocal narrowing of the right A1. Normal anterior communicating artery. Anterior cerebral arteries are patent to their distal aspects. No M1 stenosis or occlusion, although calcifications are noted in the left M1 segment. Normal MCA bifurcations. Distal MCA branches perfused with moderate irregularity bilaterally and calcifications in the left MCA branches. Posterior circulation: Vertebral arteries patent to the vertebrobasilar junction without stenosis. Posterior inferior cerebral arteries patent bilaterally. Basilar patent to its distal aspect. Superior cerebellar arteries patent bilaterally. PCAs perfused to their distal aspects without focal stenosis. Venous sinuses: As permitted by contrast timing, patent. Anatomic variants: None significant. Review of the MIP images confirms the above findings IMPRESSION: 1. Hypodensity in the posterior left frontal lobe, which  is concerning for an acute or subacute infarct. 2. No intracranial large vessel occlusion. Multifocal calcifications in the left MCA, likely with multifocal stenosis. 3. Multifocal narrowing of the right A1 and bilateral distal MCA branches. 4. No hemodynamically significant stenosis in the neck. 5. Subcentimeter incidental thyroid nodule. No follow-up imaging is recommended. Reference: J Am Coll Radiol. 2015 Feb;12(2): 143-50 6. Aortic Atherosclerosis (ICD10-I70.0). These results were called by telephone at the time of interpretation on 12/25/2020 at 11:29 pm to provider [facility] , who verbally acknowledged these results. Electronically Signed   By: Merilyn Baba M.D.   On: 12/25/2020 23:29    EKG: I independently viewed the EKG done and my findings are as followed: Sinus tachycardia at a rate of 106 bpm  Assessment/Plan Present on Admission:  Acute ischemic stroke [facility])  Principal Problem:   Acute ischemic stroke [facility]) Active Problems:   Essential hypertension   Mixed  hyperlipidemia   Acute/subacute ischemic stroke CT angiography of head without contrast was concerning for an acute or subacute infarct.  Patient will be admitted to telemetry unit  Echocardiogram in the morning MRI of brain without contrast in the morning Consider aspirin and statin once patient passes swallow eval Continue fall precautions and neuro checks Lipid panel and hemoglobin A1c will be checked Continue PT/SLP/OT eval and treat Bedside swallow eval by nursing prior to diet Neurology will be consulted and we shall await further recommendations   Essential hypertension  Antihypertensives PRN if Blood pressure is greater than 220/120 or there is a concern for End organ damage/contraindications for permissive HTN. If blood pressure is greater than 220/120 give labetalol PO or IV or Vasotec IV with a goal of 15% reduction in BP during the first 24 hours.  Mixed hyperlipidemia Consider starting patient's statin after passing the swallow eval  DVT prophylaxis: SCDs  Code Status: Full code  Family Communication: Son and daughter at bedside (all questions answered to satisfaction)  Disposition Plan:   Patient is from:                        home Anticipated DC to:                   SNF or family members home Anticipated DC date:               2-3 days Anticipated DC barriers:          Patient requires inpatient management due to acute/subacute ischemic stroke pending further stroke work-up and neurology consult  Consults called: Neurology  Admission status: Observation    Bernadette Hoit MD [facility]  12/26/2020, 5:45 AM

## 2020-12-26 NOTE — Plan of Care (Signed)
  Problem: Acute Rehab OT Goals (only OT should resolve) Goal: Pt. Will Perform Eating Flowsheets (Taken 12/26/2020 1550) Pt Will Perform Eating:  with modified independence  sitting Goal: Pt. Will Perform Grooming Flowsheets (Taken 12/26/2020 1550) Pt Will Perform Grooming:  with modified independence  standing Goal: Pt. Will Perform Upper Body Bathing Flowsheets (Taken 12/26/2020 1550) Pt Will Perform Upper Body Bathing:  with min assist  sitting Goal: Pt. Will Perform Lower Body Bathing Flowsheets (Taken 12/26/2020 1550) Pt Will Perform Lower Body Bathing:  with min assist  sit to/from stand  sitting/lateral leans Goal: Pt. Will Perform Upper Body Dressing Flowsheets (Taken 12/26/2020 1550) Pt Will Perform Upper Body Dressing:  with min assist  sitting Goal: Pt. Will Perform Lower Body Dressing Flowsheets (Taken 12/26/2020 1550) Pt Will Perform Lower Body Dressing:  with min assist  sit to/from stand  sitting/lateral leans Goal: Pt. Will Transfer To Toilet Flowsheets (Taken 12/26/2020 1550) Pt Will Transfer to Toilet:  with supervision  regular height toilet  ambulating Goal: Pt. Will Perform Toileting-Clothing Manipulation Flowsheets (Taken 12/26/2020 1550) Pt Will Perform Toileting - Clothing Manipulation and hygiene:  with min assist  sit to/from stand  sitting/lateral leans Goal: OT Additional ADL Goal #1 Flowsheets (Taken 12/26/2020 1550) Additional ADL Goal #1: Pt will demonstrate/verbalize understanding of RUE positioning and weightbearing techniques for the RUE while completing ADL tasks and/or seated in recliner.

## 2020-12-26 NOTE — Progress Notes (Signed)
Inpatient Rehab Admissions Coordinator:   CIR consult received. [facility] team does  not have staff to begin pt. Assessment until Friday, but an [facility] will see pt. On Friday for full assessment of CIR candidacy.   Clemens Catholic, [place], [place] Admissions Coordinator  [phone] ([place]) [phone] (office)

## 2020-12-26 NOTE — Progress Notes (Signed)
*  PRELIMINARY RESULTS* Echocardiogram 2D Echocardiogram has been performed.  Natasha Vazquez 12/26/2020, 11:11 AM

## 2020-12-27 ENCOUNTER — Encounter (HOSPITAL_COMMUNITY): Payer: Self-pay | Admitting: Internal Medicine

## 2020-12-27 DIAGNOSIS — I639 Cerebral infarction, unspecified: Secondary | ICD-10-CM | POA: Diagnosis not present

## 2020-12-27 MED ORDER — CLOPIDOGREL BISULFATE 75 MG PO TABS
75.0000 mg | ORAL_TABLET | Freq: Every day | ORAL | Status: DC
  Administered 2020-12-27 – 2021-01-02 (×7): 75 mg via ORAL
  Filled 2020-12-27 (×7): qty 1

## 2020-12-27 MED ORDER — ASPIRIN 325 MG PO TABS
325.0000 mg | ORAL_TABLET | Freq: Every day | ORAL | Status: DC
  Administered 2020-12-27 – 2021-01-02 (×7): 325 mg via ORAL
  Filled 2020-12-27 (×7): qty 1

## 2020-12-27 MED ORDER — PANTOPRAZOLE SODIUM 40 MG PO TBEC
40.0000 mg | DELAYED_RELEASE_TABLET | Freq: Every day | ORAL | Status: DC
  Administered 2020-12-27 – 2021-01-02 (×7): 40 mg via ORAL
  Filled 2020-12-27 (×6): qty 1

## 2020-12-27 NOTE — Progress Notes (Signed)
Speech Language Pathology Treatment: Dysphagia  Patient Details Name: Natasha Vazquez MRN: [id number] DOB: 1950-12-23 Today's Date: 12/27/2020 Time: 2111-5520 SLP Time Calculation (min) (ACUTE ONLY): 19 min  Assessment / Plan / Recommendation Clinical Impression  Pt seen for ongoing dysphagia intervention following BSE completed yesterday. Her brother and her daughter, Natasha Vazquez were present for the visit. Pt continues with reduced awareness to her right with labial spillage, some mild pocketing, and inability to lick her lips to clean off her mouth despite showing her in mirror. Improved performance noted with ice chips presented one at a time, however continued immediate and delayed coughing with sips of thin water. Continue D2 and NTL, po medications whole in puree, and supervision with meals and assist with self feeding due to impulsivity and reduced awareness. Plan for MBSS tomorrow if radiology can accommodate. Above discussed with RN and Pt's family.    HPI HPI: Natasha Vazquez is a 70 y.o. female with medical history significant for hypertension, hyperlipidemia who presents to the emergency department via EMS due to altered mental status.  Patient was unable to provide history, history was obtained from ED physician and ED medical record as well as from son and daughter at bedside.  Per report, patient was last seen normal on 11/21 around 2 PM, son states that patient was seen on the floor, it was unknown how long she has been on the floor.  She was not responding, EMS was activated and patient was taken to the ED for further evaluation and management. 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  Mild petechial hemorrhage without mass occupying hemorrhagic  transformation. Edema without significant mass effect. Moderate chronic microvascular ischemic disease. BSE/SLE requested.      SLP Plan   Continue with current plan of care;MBS      Recommendations for follow up therapy are one component of a multi-disciplinary discharge planning process, led by the attending physician.  Recommendations may be updated based on patient status, additional functional criteria and insurance authorization.    Recommendations  Diet recommendations: Dysphagia 2 (fine chop);Nectar-thick liquid Liquids provided via: Teaspoon;Cup Medication Administration: Whole meds with puree Supervision: Staff to assist with self feeding;Full supervision/cueing for compensatory strategies Compensations: Slow rate;Small sips/bites;Lingual sweep for clearance of pocketing;Monitor for anterior loss Postural Changes and/or Swallow Maneuvers: Seated upright 90 degrees;Upright 30-60 min after meal                Oral Care Recommendations: Oral care BID;Staff/trained caregiver to provide oral care Follow Up Recommendations: Acute inpatient rehab (3hours/day) Assistance recommended at discharge: Frequent or constant Supervision/Assistance SLP Visit Diagnosis: Dysphagia, unspecified (R13.10) Plan: Continue with current plan of care;MBS       Thank you,  Natasha Vazquez, [place]                 [place]  12/27/2020, 10:25 AM

## 2020-12-27 NOTE — Evaluation (Signed)
Speech Language Pathology Evaluation Patient Details Name: Natasha Vazquez MRN: [id number] DOB: 1950/07/04 Today's Date: 12/27/2020 Time: 3570-1779 SLP Time Calculation (min) (ACUTE ONLY): 23 min  Problem List:  Patient Active Problem List   Diagnosis Date Noted   Acute ischemic stroke ([place]) 12/26/2020   Essential hypertension 12/26/2020   Mixed hyperlipidemia 12/26/2020   Acute ischemic left MCA stroke ([place]) 12/26/2020   Past Medical History:  Past Medical History:  Diagnosis Date   Hypertension    TMJ (dislocation of temporomandibular joint)    Past Surgical History:  Past Surgical History:  Procedure Laterality Date   ABDOMINAL HYSTERECTOMY     CHOLECYSTECTOMY     IR ANGIO INTRA EXTRACRAN SEL INTERNAL CAROTID BILAT MOD SED  09/16/2016   IR ANGIO VERTEBRAL SEL VERTEBRAL BILAT MOD SED  09/16/2016   MASS EXCISION Left 02/10/2020   Procedure: excision of soft tissue mass left upper back;  Surgeon: Jesusita Oka, MD;  Location: [facility];  Service: General;  Laterality: Left;   HPI:  Natasha Vazquez is a 70 y.o. female with medical history significant for hypertension, hyperlipidemia who presents to the emergency department via EMS due to altered mental status.  Patient was unable to provide history, history was obtained from ED physician and ED medical record as well as from son and daughter at bedside.  Per report, patient was last seen normal on 11/21 around 2 PM, son states that patient was seen on the floor, it was unknown how long she has been on the floor.  She was not responding, EMS was activated and patient was taken to the ED for further evaluation and management. 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  Mild petechial hemorrhage without mass occupying hemorrhagic  transformation. Edema without significant mass effect. Moderate chronic microvascular ischemic  disease. BSE/SLE requested.   Assessment / Plan / Recommendation Clinical Impression  Pt presents with severe global aphasia with limited speech out put. She verbalizes "No", occasionally "yeah", and some grunts despite max multimodality cues. Pt was able to match intonation of "no" to music played and exhibited some smiling and laughing appropriately. She follows 1-step basic commands with visual model and SLP tactile cues only and then perseverates on tasks. She was unable to identify and object in field of two and demostrates reduced awareness to her right side. She does not respond appropriately to "yes/no" questions at this time. Pt's family was encouraged to provide Pt with visual cues and gestures whenever possible. 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 SLP will follow in acute stay and will also need inpatient rehab. Above to family.    SLP Assessment  SLP Recommendation/Assessment: Patient needs continued Speech Lanaguage Pathology Services SLP Visit Diagnosis: Aphasia (R47.01);Apraxia (R48.2);Cognitive communication deficit (R41.841)    Recommendations for follow up therapy are one component of a multi-disciplinary discharge planning process, led by the attending physician.  Recommendations may be updated based on patient status, additional functional criteria and insurance authorization.    Follow Up Recommendations  Acute inpatient rehab (3hours/day)    Assistance Recommended at Discharge  Frequent or constant Supervision/Assistance  Functional Status Assessment Patient has had a recent decline in their functional status and demonstrates the ability to make significant improvements in function in a reasonable and predictable amount of time.  Frequency and Duration min 2x/week  1 week  SLP Evaluation Cognition  Overall Cognitive Status: Difficult to assess (due to severity of receptive and expressive  aphasia) Awareness:  (reduced awarensess to the right side of her body and face) Problem Solving: Impaired Problem Solving Impairment: Verbal basic Executive Function: Self Monitoring;Self Correcting Self Monitoring: Impaired Behaviors: Perseveration;Impulsive       Comprehension  Auditory Comprehension Overall Auditory Comprehension: Impaired Yes/No Questions: Impaired Basic Biographical Questions: 0-25% accurate Commands: Impaired One Step Basic Commands: 0-24% accurate Conversation:  (impaired, improved with context) Interfering Components: Motor planning EffectiveTechniques: Visual/Gestural cues Visual Recognition/Discrimination Discrimination: Exceptions to [facility] Common Objects: Unable to indentify Reading Comprehension Reading Status: Impaired Word level: Impaired Sentence Level: Not tested    Expression Expression Primary Mode of Expression: Verbal Verbal Expression Overall Verbal Expression: Impaired Initiation: Impaired Automatic Speech:  ("No") Level of Generative/Spontaneous Verbalization: Word Repetition: Impaired Level of Impairment: Word level Naming: Impairment Responsive: 0-25% accurate Confrontation: Impaired Common Objects: Unable to indentify Convergent: 0-24% accurate Divergent: 0-24% accurate Verbal Errors: Perseveration Pragmatics: No impairment Effective Techniques: Melodic intonation (able to match "no" to intonation of songs) Non-Verbal Means of Communication: Not applicable Written Expression Dominant Hand: Right Written Expression: Not tested   Oral / Motor  Oral Motor/Sensory Function Overall Oral Motor/Sensory Function: Moderate impairment Facial ROM: Reduced right;Suspected CN VII (facial) dysfunction Facial Symmetry: Abnormal symmetry right;Suspected CN VII (facial) dysfunction Facial Strength: Reduced right Facial Sensation: Reduced right Lingual ROM: Within Functional Limits Lingual Symmetry: Within Functional Limits Lingual  Strength: Within Functional Limits Lingual Sensation: Within Functional Limits Velum:  (could not visualize) Mandible: Within Functional Limits Motor Speech Overall Motor Speech: Impaired Respiration: Within functional limits Phonation: Normal Resonance: Within functional limits Articulation: Impaired Level of Impairment: Word Intelligibility: Intelligibility reduced Word: 0-24% accurate Motor Planning: Impaired Level of Impairment: Word Motor Speech Errors: Aware;Unaware   Thank you,  Genene Churn, [place]                     [place] 12/27/2020, 10:41 AM

## 2020-12-27 NOTE — Progress Notes (Signed)
PROGRESS NOTE    Natasha Vazquez  FWY:[id number] DOB: December 24, 1950 DOA: 12/25/2020 PCP: Carol Ada, MD    Brief Narrative:  Patient with history of hypertension hyperlipidemia who was found by her children in the evening at home unresponsive, unable to express and covered with feces.  EMS activated and brought to the ER.  Patient was found to have dense right upper extremity hemiplegia and evidence of left-sided stroke.   Assessment & Plan:   Principal Problem:   Acute ischemic stroke [facility]) Active Problems:   Essential hypertension   Mixed hyperlipidemia   Acute ischemic left MCA stroke (HCC)  Acute ischemic stroke, left sided multiple territory stroke: Clinical findings, unresponsiveness but likely unable to express, dense right upper extremity hemiplegia and right-sided facial droop. CT head findings, hypodense area left cerebrum. MRI of the brain, extensive left ACA and MCA territory infarct.  No mass-effect. CTA of the head and neck, no large vessel occlusion but multiple intracranial stenosis. 2D echocardiogram, essentially normal.  Normal ejection fraction.  No thrombosis. Antiplatelet therapy, none at home. LDL 77 Hemoglobin A1c, 5.6  -Passed bedside swallow evaluation by speech therapist and she was able to safely eat dysphagia to diet with nectar thick liquid.  She will need ongoing aggressive rehab. -Antiplatelet therapy, none at home.  Neurology recommended aspirin 325 mg daily and Plavix 75 mg daily for 3 months and then continue aspirin.  History of GI bleed 20 years ago associated with aspirin.  Currently taking Mobic and intermittent ibuprofen with no recurrence symptoms.  Recent negative colonoscopy.  Benefits outweigh risk.  Will start on antiplatelet therapy as above along with Protonix for gastric protection. -Antilipid therapy.  LDL 77.  On Crestor 10 mg at home.  Increase to 20 mg. -Seen by PT OT.  Recommended inpatient physical therapy.  Refer to acute  inpatient rehab. -Recommended 30-day event monitoring for A. fib, will refer to cardiology on discharge and hopefully this can be done when she goes to rehab.  Essential hypertension: Blood pressures at goal today.  Will gradually decrease blood pressure.   DVT prophylaxis: SCD's Start: 12/26/20 2774   Code Status: Full code Family Communication: Daughter at the bedside Disposition Plan: Status is: Inpatient  Remains inpatient appropriate because: Needing acute inpatient rehab.         Consultants:  Neurology  Procedures:  None  Antimicrobials:  None   Subjective: Patient seen and examined.  Daughter and other family members were at the bedside.  She was able to eat 100% of her meal with help of her daughter.  Patient is unable to express but she is understanding that she needs to go to rehab.  Denies any other complaints.  Objective: Vitals:   12/26/20 2039 12/26/20 2314 12/27/20 0319 12/27/20 0605  BP: (!) 142/126 (!) 151/65 (!) 155/77 (!) 145/62  Pulse: 91 86 84 80  Resp: 19 19 20 19   Temp: (!) 97.5 F (36.4 C) 98.9 F (37.2 C) 98.5 F (36.9 C) 98.2 F (36.8 C)  TempSrc:  Oral Oral Oral  SpO2: 96% 99% 99% 97%    Intake/Output Summary (Last 24 hours) at 12/27/2020 1104 Last data filed at 12/27/2020 0900 Gross per 24 hour  Intake 1350 ml  Output --  Net 1350 ml   There were no vitals filed for this visit.  Examination:  General exam: Appears calm and comfortable  Respiratory system: Clear to auscultation. Respiratory effort normal. Cardiovascular system: S1 & S2 heard, RRR. No JVD, murmurs, rubs,  gallops or clicks. No pedal edema. Gastrointestinal system: Abdomen is nondistended, soft and nontender. No organomegaly or masses felt. Normal bowel sounds heard. Central nervous system: Alert and oriented.  Expressive aphasia.  Right facial droop.  Right upper extremity flaccid.    Data Reviewed: I have personally reviewed following labs and imaging  studies  CBC: Recent Labs  Lab 12/25/20 2035 12/25/20 2058  WBC 8.0  --   NEUTROABS 5.6  --   HGB 12.9 13.6  HCT 38.0 40.0  MCV 96.7  --   PLT 333  --    Basic Metabolic Panel: Recent Labs  Lab 12/25/20 2035 12/25/20 2058  NA 135 140  K 4.1 4.0  CL 103 105  CO2 21*  --   GLUCOSE 147* 143*  BUN 19 18  CREATININE 0.75 0.70  CALCIUM 9.6  --    GFR: CrCl cannot be calculated (Unknown ideal weight.). Liver Function Tests: Recent Labs  Lab 12/25/20 2035  AST 48*  ALT 37  ALKPHOS 112  BILITOT 0.7  PROT 8.0  ALBUMIN 4.4   No results for input(s): LIPASE, AMYLASE in the last 168 hours. No results for input(s): AMMONIA in the last 168 hours. Coagulation Profile: Recent Labs  Lab 12/25/20 2035  INR 1.0   Cardiac Enzymes: No results for input(s): CKTOTAL, CKMB, CKMBINDEX, TROPONINI in the last 168 hours. BNP (last 3 results) No results for input(s): PROBNP in the last 8760 hours. HbA1C: Recent Labs    12/26/20 0618  HGBA1C 5.6   CBG: No results for input(s): GLUCAP in the last 168 hours. Lipid Profile: Recent Labs    12/26/20 0618  CHOL 135  HDL 38*  LDLCALC 77  TRIG 102  CHOLHDL 3.6   Thyroid Function Tests: No results for input(s): TSH, T4TOTAL, FREET4, T3FREE, THYROIDAB in the last 72 hours. Anemia Panel: No results for input(s): VITAMINB12, FOLATE, FERRITIN, TIBC, IRON, RETICCTPCT in the last 72 hours. Sepsis Labs: No results for input(s): PROCALCITON, LATICACIDVEN in the last 168 hours.  Recent Results (from the past 240 hour(s))  Resp Panel by RT-PCR (Flu A&B, Covid) Nasopharyngeal Swab     Status: None   Collection Time: 12/25/20  8:40 PM   Specimen: Nasopharyngeal Swab; Nasopharyngeal(NP) swabs in vial transport medium  Result Value Ref Range Status   SARS Coronavirus 2 by RT PCR NEGATIVE NEGATIVE Final    Comment: (NOTE) SARS-CoV-2 target nucleic acids are NOT DETECTED.  The SARS-CoV-2 RNA is generally detectable in upper  respiratory specimens during the acute phase of infection. The lowest concentration of SARS-CoV-2 viral copies this assay can detect is 138 copies/mL. A negative result does not preclude SARS-Cov-2 infection and should not be used as the sole basis for treatment or other patient management decisions. A negative result may occur with  improper specimen collection/handling, submission of specimen other than nasopharyngeal swab, presence of viral mutation(s) within the areas targeted by this assay, and inadequate number of viral copies(<138 copies/mL). A negative result must be combined with clinical observations, patient history, and epidemiological information. The expected result is Negative.  Fact Sheet for Patients:  EntrepreneurPulse.com.au  Fact Sheet for Healthcare Providers:  IncredibleEmployment.be  This test is no t yet approved or cleared by the [place] FDA and  has been authorized for detection and/or diagnosis of SARS-CoV-2 by FDA under an Emergency Use Authorization (EUA). This EUA will remain  in effect (meaning this test can be used) for the duration of the COVID-19 declaration under Section 564(b)(1)  of the Act, 21 U.S.C.section 360bbb-3(b)(1), unless the authorization is terminated  or revoked sooner.       Influenza A by PCR NEGATIVE NEGATIVE Final   Influenza B by PCR NEGATIVE NEGATIVE Final    Comment: (NOTE) The Xpert Xpress SARS-CoV-2/FLU/RSV plus assay is intended as an aid in the diagnosis of influenza from Nasopharyngeal swab specimens and should not be used as a sole basis for treatment. Nasal washings and aspirates are unacceptable for Xpert Xpress SARS-CoV-2/FLU/RSV testing.  Fact Sheet for Patients: EntrepreneurPulse.com.au  Fact Sheet for Healthcare Providers: IncredibleEmployment.be  This test is not yet approved or cleared by the [place] FDA and has been  authorized for detection and/or diagnosis of SARS-CoV-2 by FDA under an Emergency Use Authorization (EUA). This EUA will remain in effect (meaning this test can be used) for the duration of the COVID-19 declaration under Section 564(b)(1) of the Act, 21 U.S.C. section 360bbb-3(b)(1), unless the authorization is terminated or revoked.  Performed at [facility], [address], [place], [place] [address]          Radiology Studies: CT ANGIO HEAD NECK W WO CM  Result Date: 12/25/2020 CLINICAL DATA:  Altered mental status EXAM: CT ANGIOGRAPHY HEAD AND NECK TECHNIQUE: Multidetector CT imaging of the head and neck was performed using the standard protocol during bolus administration of intravenous contrast. Multiplanar CT image reconstructions and MIPs were obtained to evaluate the vascular anatomy. Carotid stenosis measurements (when applicable) are obtained utilizing NASCET criteria, using the distal internal carotid diameter as the denominator. CONTRAST:  168mL OMNIPAQUE IOHEXOL 350 MG/ML SOLN COMPARISON:  No prior CTA, correlation is made with 08/12/2016 MRI and MRA head FINDINGS: CT HEAD FINDINGS Brain: Hypodensity in the left posterior frontal lobe cortex and white matter (series 5, image 20). No acute hemorrhage, mass, mass effect, or midline shift. Parenchymal calcifications in the left temporal and posterior frontal lobe, which may be vascular calcifications. Vascular: No hyperdense vessel. Calcifications along the expected course of the left MCA. Skull: Normal. Negative for fracture or focal lesion. Sinuses: Small osteoma in the left ethmoid air cells. Otherwise negative. Orbits: Status post bilateral lens replacements. Review of the MIP images confirms the above findings CTA NECK FINDINGS Aortic arch: Standard branching. Imaged portion shows no evidence of aneurysm or dissection. No significant stenosis of the major arch vessel origins. Aortic atherosclerosis. Right carotid system: No  evidence of dissection, stenosis (50% or greater) or occlusion. Left carotid system: No evidence of dissection, stenosis (50% or greater) or occlusion. Vertebral arteries: Codominant. No evidence of dissection, stenosis (50% or greater) or occlusion. Skeleton: No acute osseous abnormality. Degenerative changes in the cervical spine and right greater than left temporomandibular joints. Other neck: Subcentimeter right thyroid lobe nodule. Otherwise negative Upper chest: Right greater than left apical pleuroparenchymal scarring. No focal pulmonary opacity or pleural effusion. Review of the MIP images confirms the above findings CTA HEAD FINDINGS Anterior circulation: Both internal carotid arteries are patent to the termini, without stenosis or other abnormality. A1 segments patent, with multifocal narrowing of the right A1. Normal anterior communicating artery. Anterior cerebral arteries are patent to their distal aspects. No M1 stenosis or occlusion, although calcifications are noted in the left M1 segment. Normal MCA bifurcations. Distal MCA branches perfused with moderate irregularity bilaterally and calcifications in the left MCA branches. Posterior circulation: Vertebral arteries patent to the vertebrobasilar junction without stenosis. Posterior inferior cerebral arteries patent bilaterally. Basilar patent to its distal aspect. Superior cerebellar arteries patent bilaterally. PCAs  perfused to their distal aspects without focal stenosis. Venous sinuses: As permitted by contrast timing, patent. Anatomic variants: None significant. Review of the MIP images confirms the above findings IMPRESSION: 1. Hypodensity in the posterior left frontal lobe, which is concerning for an acute or subacute infarct. 2. No intracranial large vessel occlusion. Multifocal calcifications in the left MCA, likely with multifocal stenosis. 3. Multifocal narrowing of the right A1 and bilateral distal MCA branches. 4. No hemodynamically  significant stenosis in the neck. 5. Subcentimeter incidental thyroid nodule. No follow-up imaging is recommended. Reference: J Am Coll Radiol. 2015 Feb;12(2): 143-50 6. Aortic Atherosclerosis (ICD10-I70.0). These results were called by telephone at the time of interpretation on 12/25/2020 at 11:29 pm to provider [facility] , who verbally acknowledged these results. Electronically Signed   By: Merilyn Baba M.D.   On: 12/25/2020 23:29   MR BRAIN WO CONTRAST  Result Date: 12/26/2020 CLINICAL DATA:  Neuro deficit, acute, stroke suspected EXAM: MRI HEAD WITHOUT CONTRAST TECHNIQUE: Multiplanar, multiecho pulse sequences of the brain and surrounding structures were obtained without intravenous contrast. COMPARISON:  December 25, 2020 CT/CTA.  MRI 08/12/2016. FINDINGS: Brain: Extensive acute infarcts throughout the left cerebrum, including left frontal, parietal, and temporal lobes and left basal ganglia. Associated edema without significant mass effect. No midline shift. No hydrocephalus, mass lesion, or extra-axial fluid collection. Additional moderate scattered T2/FLAIR hyperintensities in the white matter, nonspecific but compatible with chronic microvascular ischemic disease. Small amount of curvilinear susceptibility artifact in the left frontal parietal region in the region of acute infarct, likely petechial hemorrhage. No mass occupying hemorrhage. Vascular: Major arterial flow voids are maintained at the skull base. Further evaluated on CTA from December 25, 2020. Skull and upper cervical spine: Normal marrow signal. Sinuses/Orbits: Clear sinuses.  Unremarkable orbits. Other: No sizable mastoid effusions. IMPRESSION: 1. Extensive acute infarcts throughout the left cerebrum, including left frontal, parietal, and temporal lobes and left basal ganglia. Mild petechial hemorrhage without mass occupying hemorrhagic transformation. Edema without significant mass effect. 2. Moderate chronic microvascular ischemic  disease. Electronically Signed   By: Margaretha Sheffield M.D.   On: 12/26/2020 12:18   ECHOCARDIOGRAM COMPLETE  Result Date: 12/26/2020    ECHOCARDIOGRAM REPORT   Patient Name:   KESHONDA MONSOUR Date of Exam: 12/26/2020 Medical Rec #:  [id number]      Height:       68.0 in Accession #:    [id number]     Weight:       169.3 lb Date of Birth:  Jul 03, 1950     BSA:          1.904 m Patient Age:    11 years       BP:           156/78 mmHg Patient Gender: F              HR:           97 bpm. Exam Location:  [facility] Procedure: 2D Echo, Cardiac Doppler and Color Doppler Indications:    Stroke  History:        Patient has no prior history of Echocardiogram examinations.                 Stroke; Risk Factors:Hypertension and Dyslipidemia.  Sonographer:    Wenda Low Referring Phys: [id number] OLADAPO ADEFESO IMPRESSIONS  1. Left ventricular ejection fraction, by estimation, is 65 to 70%. The left ventricle has normal function. The left ventricle has no regional wall motion  abnormalities. Left ventricular diastolic parameters are consistent with Grade I diastolic dysfunction (impaired relaxation).  2. Right ventricular systolic function is normal. The right ventricular size is normal. There is normal pulmonary artery systolic pressure.  3. The mitral valve is normal in structure. No evidence of mitral valve regurgitation. No evidence of mitral stenosis.  4. The aortic valve is tricuspid. Aortic valve regurgitation is not visualized. No aortic stenosis is present.  5. The inferior vena cava is normal in size with greater than 50% respiratory variability, suggesting right atrial pressure of 3 mmHg. FINDINGS  Left Ventricle: Left ventricular ejection fraction, by estimation, is 65 to 70%. The left ventricle has normal function. The left ventricle has no regional wall motion abnormalities. The left ventricular internal cavity size was normal in size. There is  no left ventricular hypertrophy. Left ventricular diastolic  parameters are consistent with Grade I diastolic dysfunction (impaired relaxation). Normal left ventricular filling pressure. Right Ventricle: The right ventricular size is normal. No increase in right ventricular wall thickness. Right ventricular systolic function is normal. There is normal pulmonary artery systolic pressure. The tricuspid regurgitant velocity is 2.28 m/s, and  with an assumed right atrial pressure of 3 mmHg, the estimated right ventricular systolic pressure is 16.1 mmHg. Left Atrium: Left atrial size was normal in size. Right Atrium: Right atrial size was normal in size. Pericardium: There is no evidence of pericardial effusion. Mitral Valve: The mitral valve is normal in structure. No evidence of mitral valve regurgitation. No evidence of mitral valve stenosis. MV peak gradient, 6.8 mmHg. The mean mitral valve gradient is 3.0 mmHg. Tricuspid Valve: The tricuspid valve is normal in structure. Tricuspid valve regurgitation is not demonstrated. No evidence of tricuspid stenosis. Aortic Valve: The aortic valve is tricuspid. Aortic valve regurgitation is not visualized. No aortic stenosis is present. Aortic valve mean gradient measures 4.0 mmHg. Aortic valve peak gradient measures 7.7 mmHg. Aortic valve area, by VTI measures 2.84 cm. Pulmonic Valve: The pulmonic valve was not well visualized. Pulmonic valve regurgitation is not visualized. No evidence of pulmonic stenosis. Aorta: The aortic root is normal in size and structure. Venous: The inferior vena cava is normal in size with greater than 50% respiratory variability, suggesting right atrial pressure of 3 mmHg. IAS/Shunts: No atrial level shunt detected by color flow Doppler.  LEFT VENTRICLE PLAX 2D LVIDd:         4.20 cm     Diastology LVIDs:         2.40 cm     LV e' medial:    10.40 cm/s LV PW:         1.10 cm     LV E/e' medial:  9.1 LV IVS:        1.00 cm     LV e' lateral:   13.60 cm/s LVOT diam:     2.00 cm     LV E/e' lateral: 7.0 LV SV:          82 LV SV Index:   43 LVOT Area:     3.14 cm  LV Volumes (MOD) LV vol d, MOD A2C: 50.0 ml LV vol d, MOD A4C: 66.8 ml LV vol s, MOD A2C: 13.2 ml LV vol s, MOD A4C: 24.0 ml LV SV MOD A2C:     36.8 ml LV SV MOD A4C:     66.8 ml LV SV MOD BP:      40.7 ml RIGHT VENTRICLE RV Basal diam:  2.50 cm RV Mid  diam:    2.10 cm RV S prime:     13.60 cm/s TAPSE (M-mode): 2.5 cm LEFT ATRIUM             Index        RIGHT ATRIUM           Index LA diam:        3.40 cm 1.79 cm/m   RA Area:     11.70 cm LA Vol (A2C):   38.4 ml 20.17 ml/m  RA Volume:   25.30 ml  13.29 ml/m LA Vol (A4C):   39.6 ml 20.80 ml/m LA Biplane Vol: 39.1 ml 20.54 ml/m  AORTIC VALVE                    PULMONIC VALVE AV Area (Vmax):    2.80 cm     PV Vmax:       0.92 m/s AV Area (Vmean):   2.71 cm     PV Peak grad:  3.4 mmHg AV Area (VTI):     2.84 cm AV Vmax:           139.00 cm/s AV Vmean:          99.100 cm/s AV VTI:            0.288 m AV Peak Grad:      7.7 mmHg AV Mean Grad:      4.0 mmHg LVOT Vmax:         124.00 cm/s LVOT Vmean:        85.500 cm/s LVOT VTI:          0.260 m LVOT/AV VTI ratio: 0.90  AORTA Ao Root diam: 2.70 cm MITRAL VALVE                TRICUSPID VALVE MV Area (PHT): 4.49 cm     TR Peak grad:   20.8 mmHg MV Area VTI:   3.73 cm     TR Vmax:        228.00 cm/s MV Peak grad:  6.8 mmHg MV Mean grad:  3.0 mmHg     SHUNTS MV Vmax:       1.30 m/s     Systemic VTI:  0.26 m MV Vmean:      82.5 cm/s    Systemic Diam: 2.00 cm MV Decel Time: 169 msec MV E velocity: 94.60 cm/s MV A velocity: 121.00 cm/s MV E/A ratio:  0.78 Carlyle Dolly MD Electronically signed by Carlyle Dolly MD Signature Date/Time: 12/26/2020/11:20:27 AM    Final         Scheduled Meds:  aspirin  325 mg Oral Daily   clopidogrel  75 mg Oral Daily   pantoprazole  40 mg Oral Daily   rosuvastatin  20 mg Oral Daily   Or   rosuvastatin  20 mg Per Tube Daily   Continuous Infusions:   LOS: 1 day    Time spent: 35 minutes    Barb Merino,  MD [facility] Pager [phone]

## 2020-12-28 DIAGNOSIS — I639 Cerebral infarction, unspecified: Secondary | ICD-10-CM | POA: Diagnosis not present

## 2020-12-28 MED ORDER — AMLODIPINE BESYLATE 5 MG PO TABS
2.5000 mg | ORAL_TABLET | Freq: Every day | ORAL | Status: DC
  Administered 2020-12-28 – 2021-01-02 (×6): 2.5 mg via ORAL
  Filled 2020-12-28 (×6): qty 1

## 2020-12-28 MED ORDER — LOSARTAN POTASSIUM 50 MG PO TABS
100.0000 mg | ORAL_TABLET | Freq: Every day | ORAL | Status: DC
  Administered 2020-12-28 – 2021-01-02 (×6): 100 mg via ORAL
  Filled 2020-12-28 (×6): qty 2

## 2020-12-28 NOTE — TOC Initial Note (Incomplete)
Transition of Care [facility]) - Initial/Assessment Note    Patient Details  Name: Natasha Vazquez MRN: [id number] Date of Birth: 12/04/1950  Transition of [facility]) CM/SW Contact:    Natasha Bence, LCSW Phone Number: 12/28/2020, 11:55 AM  Clinical Narrative:                 CSW contact [facility] to inquire about patient's eligibility for CIR. Eugenia with [facility] reported that she has been inter  Expected Discharge Plan: [place] Barriers to Discharge: Continued Medical Work up   Patient Goals and CMS Choice Patient states their goals for this hospitalization and ongoing recovery are:: Rehab with [facility] CMS Medicare.gov Compare Post Acute Care list provided to:: Patient Choice offered to / list presented to : Patient  Expected Discharge Plan and Services Expected Discharge Plan: [place] arrangements for the past 2 months: [place]                                      Prior Living Arrangements/Services Living arrangements for the past 2 months: [place] Lives with:: Self, Adult Children Patient language and need for interpreter reviewed:: Yes Do you feel safe going back to the place where you live?: Yes      Need for Family Participation in Patient Care: Yes (Comment) Care giver support system in place?: Yes (comment)   Criminal Activity/Legal Involvement Pertinent to Current Situation/Hospitalization: No - Comment as needed  Activities of Daily Living   ADL Screening (condition at time of admission) Patient's cognitive ability adequate to safely complete daily activities?: No Is the patient deaf or have difficulty hearing?: No Does the patient have difficulty seeing, even when wearing glasses/contacts?: No Does the patient have difficulty concentrating, remembering, or making decisions?: Yes Patient able to express need for assistance with ADLs?: No Does the patient have difficulty dressing or bathing?:  Yes Independently performs ADLs?: No Dressing (OT): Dependent Is this a change from baseline?: Change from baseline, expected to last <3days Grooming: Dependent Is this a change from baseline?: Change from baseline, expected to last <3 days Bathing: Dependent Is this a change from baseline?: Change from baseline, expected to last <3 days Toileting: Dependent Is this a change from baseline?: Change from baseline, expected to last <3 days In/Out Bed: Dependent Is this a change from baseline?: Change from baseline, expected to last <3 days Does the patient have difficulty walking or climbing stairs?: Yes Weakness of Legs: Right Weakness of Arms/Hands: Right (flaccid)  Permission Sought/Granted Permission sought to share information with : Family Supports Permission granted to share information with : Yes, Verbal Permission Granted  Share Information with NAME: Parker,Joyce  Permission granted to share info w AGENCY: [facility]  Permission granted to share info w Relationship: (Daughter)  Permission granted to share info w Contact Information: [phone]  Emotional Assessment     Affect (typically observed): Accepting, Adaptable Orientation: : Oriented to Self, Oriented to Place, Oriented to Situation Alcohol / Substance Use: Not Applicable Psych Involvement: No (comment)  Admission diagnosis:  Acute ischemic stroke [facility]) [I63.9] Cerebrovascular accident (CVA), unspecified mechanism ([place]) [I63.9] Acute ischemic left MCA stroke ([place]) [I63.512] Patient Active Problem List   Diagnosis Date Noted   Acute ischemic stroke ([place]) 12/26/2020   Essential hypertension 12/26/2020   Mixed hyperlipidemia 12/26/2020   Acute ischemic left MCA stroke ([place])  12/26/2020   PCP:  Carol Ada, MD Pharmacy:   [facility], [address] [place] [address] Phone: [phone] Fax:  [phone]     Social Determinants of Health (SDOH) Interventions    Readmission Risk Interventions No flowsheet data found.

## 2020-12-28 NOTE — PMR Pre-admission (Signed)
PMR Admission Coordinator Pre-Admission Assessment  Patient: Natasha Vazquez is an 70 y.o., female MRN: [id number] DOB: Feb 25, 1950 Height: $RemoveBefo'5\' 8"'uPhzZaYrrWX$  (172.7 cm) Weight: 76.8 kg  Insurance Information HMO: Yes HMOPOS    PPO:      PCP:      IPA:      80/20:      OTHER: Group [id number] PRIMARY: [employer] Medicare      Policy#: [id number]      Subscriber: patient CM Name:     Phone#: [phone] option #7     Fax#: [phone] Pre-Cert#: [id number]  approved for 7 days  Employer: Retired Benefits:  Phone #: [phone]     Name: [email or web address] Eff. Date: 02/04/20     Deduct: $0      Out of Pocket Max: [phone] ($0 met)      Life Max: N/A CIR: $1480 copay/admission      SNF: $0 days 1-20; $194.50 days 21-100 Outpatient: 100%     Co-Pay: none Home Health: 100$      Co-Pay: none DME: 80%     Co-Pay: 20% Providers: in network  SECONDARY: Medicaid of [place]      Policy#: [id number]     Phone#: [phone] [facility] 01/01/21  Financial Counselor:        Phone#:    The "Data Collection Information Summary" for patients in Inpatient Rehabilitation Facilities with attached "Privacy Act [place] Records" was provided and verbally reviewed with: Family  Emergency Contact Information Contact Information     Name Relation Home Work Mobile   Parker,Joyce Daughter [phone]       Current Medical History  Patient Admitting Diagnosis: Multiple L CVA  History of Present Illness: Natasha Vazquez is a 70 year old right-handed female with history of hypertension, GI bleed 20 years ago as well as hyperlipidemia.  Presented to [facility] 12/25/2020 with right side weakness, aphasia and altered mental status.  Blood pressure was elevated 178/73.  Admission chemistries unremarkable except glucose 147, alcohol negative.  CT angiogram head and neck hypodensity in the posterior left frontal lobe concerning for acute/subacute infarct.  No intracranial large vessel occlusion.  Multifocal narrowing of the right  A1 and bilateral distal MCA branches.  No hemodynamically significant stenosis in the neck.  MRI of the brain extensive acute infarcts throughout the left cerebrum, including left frontal, parietal and temporal lobes and left basal ganglia.  Mild petechial hemorrhage without mass occupying hemorrhagic transformation.  Patient did not receive tPA.  Echocardiogram with ejection fraction of 65 to 70% no regional wall motion abnormalities grade 1 diastolic dysfunction.  Neurology follow-up currently maintained on aspirin 325 mg daily and Plavix 75 mg daily for CVA prophylaxis for 3 months then aspirin alone.  She is currently on a mechanical soft diet with thin liquids.    Complete NIHSS TOTAL: 11  Patient's medical record from [facility] has been reviewed by the rehabilitation admission coordinator and physician.  Past Medical History  Past Medical History:  Diagnosis Date   Hypertension    TMJ (dislocation of temporomandibular joint)    Has the patient had major surgery during 100 days prior to admission? No  Family History   family history is not on file.  Current Medications  Current Facility-Administered Medications:    acetaminophen (TYLENOL) tablet 650 mg, 650 mg, Oral, Q4H PRN, 650 mg at 12/30/20 1830 **OR** acetaminophen (TYLENOL) 160 MG/5ML solution 650 mg, 650 mg, Per Tube, Q4H PRN **OR** acetaminophen (TYLENOL) suppository 650 mg, 650 mg, Rectal, Q4H  PRN, Bernadette Hoit, DO   amLODipine (NORVASC) tablet 2.5 mg, 2.5 mg, Oral, Daily, Ghimire, Kuber, MD, 2.5 mg at 01/02/21 0946   aspirin tablet 325 mg, 325 mg, Oral, Daily, Ghimire, Kuber, MD, 325 mg at 01/02/21 0945   clopidogrel (PLAVIX) tablet 75 mg, 75 mg, Oral, Daily, Ghimire, Kuber, MD, 75 mg at 01/02/21 0946   labetalol (NORMODYNE) injection 5 mg, 5 mg, Intravenous, Q2H PRN, Lora Havens, MD, 5 mg at 12/29/20 1831   losartan (COZAAR) tablet 100 mg, 100 mg, Oral, Daily, Ghimire, Kuber, MD, 100 mg at 01/02/21 0945    pantoprazole (PROTONIX) EC tablet 40 mg, 40 mg, Oral, Daily, Ghimire, Kuber, MD, 40 mg at 01/02/21 0946   rosuvastatin (CRESTOR) tablet 20 mg, 20 mg, Oral, Daily, 20 mg at 01/02/21 0945 **OR** rosuvastatin (CRESTOR) tablet 20 mg, 20 mg, Per Tube, Daily, Lora Havens, MD   senna-docusate (Senokot-S) tablet 1 tablet, 1 tablet, Oral, BID, Barb Merino, MD, 1 tablet at 01/02/21 0946  Patients Current Diet:  Diet Order             DIET DYS 3 Room service appropriate? Yes; Fluid consistency: Thin  Diet effective now                  Precautions / Restrictions Precautions Precautions: Fall Precaution Comments: RUE physical neglect. Right side hemiplegia. Aphasic Restrictions Weight Bearing Restrictions: No   Has the patient had 2 or more falls or a fall with injury in the past year? Yes  Prior Activity Level Community (5-7x/wk): Went out daily.  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  Prior Functional Level Self Care: Did the patient need help bathing, dressing, using the toilet or eating? Independent  Indoor Mobility: Did the patient need assistance with walking from room to room (with or without device)? Independent  Stairs: Did the patient need assistance with internal or external stairs (with or without device)? Independent  Functional Cognition: Did the patient need help planning regular tasks such as shopping or remembering to take medications? Independent  Patient Information Are you of Hispanic, Latino/a,or Spanish origin?: A. No, not of Hispanic, Latino/a, or Spanish origin What is your race?: B. Black or African American Do you need or want an interpreter to communicate with a doctor or health care staff?: 0. No  Patient's Response To:  Health Literacy and Transportation Is the patient able to respond to health literacy and transportation needs?: Yes Health Literacy - How often do you need to have someone help you when you read instructions,  pamphlets, or other written material from your doctor or pharmacy?: Rarely In the past 12 months, has lack of transportation kept you from medical appointments or from getting medications?: No In the past 12 months, has lack of transportation kept you from meetings, work, or from getting things needed for daily living?: No  Development worker, international aid / [place] Devices/Equipment: None Home Equipment: None  Prior Device Use: Indicate devices/aids used by the patient prior to current illness, exacerbation or injury? None of the above  Current Functional Level Cognition  Overall Cognitive Status: Impaired/Different from baseline Difficult to assess due to: Impaired communication Orientation Level: Oriented X4 Following Commands: Follows multi-step commands inconsistently, Follows one step commands with increased time General Comments: Patient requires occasional repeated verbal/tactile cueing to complete functional tasks and exercies Awareness:  (reduced awarensess to the right side of her body and face) Problem Solving: Impaired Problem Solving Impairment: Verbal basic Executive Function:  Self Monitoring, Self Correcting Self Monitoring: Impaired Behaviors: Perseveration, Impulsive    Extremity Assessment (includes Sensation/Coordination)  Upper Extremity Assessment: RUE deficits/detail RUE Deficits / Details: Pt noted to have improved UE active movement at 2+/5 MMT for shoulder flexion and 2-/5 for shoulder abduction. No active wrist or digit motion noted. RUE Sensation:  (unable to assess) RUE Coordination: decreased fine motor, decreased gross motor  Lower Extremity Assessment: Defer to PT evaluation RLE Deficits / Details: Decreased dorsiflexion, shuffling/step-to pattern on R side.    ADLs  Overall ADL's : Needs assistance/impaired Eating/Feeding: Set up, Sitting Grooming: Wash/dry face, Wash/dry hands, Oral care, Applying deodorant, Brushing hair, Minimal  assistance, Bed level Upper Body Bathing: Moderate assistance, Sitting Lower Body Bathing: Moderate assistance, Sit to/from stand, Sitting/lateral leans Upper Body Dressing : Moderate assistance, Sitting Lower Body Dressing: Maximal assistance, Sitting/lateral leans Lower Body Dressing Details (indicate cue type and reason): Pt requried max A to don socks seated in chair. Pt confused and had trouble following commands. Pt placed sock on her hand rather than on her feet. Toilet Transfer: Minimal assistance, BSC/3in1 Toilet Transfer Details (indicate cue type and reason): with quad cane. Physical assist provided to advance right foot to take steps due to weakness and hospital socks gripping floor. Toileting- Clothing Manipulation and Hygiene: Total assistance, Sitting/lateral lean, Sit to/from stand Functional mobility during ADLs: Min guard    Mobility  Overal bed mobility: Needs Assistance Bed Mobility: Supine to Sit, Sit to Supine Supine to sit: Supervision Sit to supine: Min guard General bed mobility comments: requires repeated verbal cueing for safety mostly due to right sided neglict and inability to use RUE for functional task    Transfers  Overall transfer level: Needs assistance Equipment used: None Transfers: Sit to/from Stand, Bed to chair/wheelchair/BSC Sit to Stand: Min guard Bed to/from chair/wheelchair/BSC transfer type:: Step pivot Step pivot transfers: Min guard General transfer comment: requires repeated verbal cueing for safety    Ambulation / Gait / Stairs / Wheelchair Mobility  Ambulation/Gait Ambulation/Gait assistance: Counsellor (Feet): 75 Feet Assistive device: None, 1 person hand held assist Gait Pattern/deviations: Decreased step length - right, Decreased stance time - right, Decreased dorsiflexion - right, Steppage General Gait Details: slow labored cadence with decreased right dorsilfexion, occasional dragging of right foot, most diffiuclty  making turns to the right requiring verbal cues and tactile assistance for safety Gait velocity: Decreased    Posture / Balance Dynamic Sitting Balance Sitting balance - Comments: Good seated EOB Balance Overall balance assessment: Needs assistance Sitting-balance support: Feet supported, No upper extremity supported Sitting balance-Leahy Scale: Good Sitting balance - Comments: Good seated EOB Standing balance support: During functional activity, No upper extremity supported Standing balance-Leahy Scale: Fair Standing balance comment: fair/good static, fair/poor dynamic due to right sided neglict with bumping into nearby objects, walls    Special needs/care consideration None   Previous Home Environment  Living Arrangements: Alone  Lives With: Alone Available Help at Discharge: Family, Other (Comment), Available PRN/intermittently Type of Home: House Home Layout: One level, Able to live on main level with bedroom/bathroom Home Access: Stairs to enter Entrance Stairs-Rails: Left Entrance Stairs-Number of Steps: 4-5 Bathroom Shower/Tub: Chiropodist: [place]: No  Discharge Living Setting Plans for Discharge Living Setting: House, Alone (Family planning to assit 24/7) Type of Home at Discharge: House Discharge Home Layout: One level Discharge Home Access: Stairs to enter Entrance Stairs-Rails: Left Entrance Stairs-Number of Steps: 4 at back entrance Discharge  Bathroom Shower/Tub: Public librarian, Chief Financial Officer: Standard Discharge Bathroom Accessibility: Yes How Accessible: Accessible via walker Does the patient have any problems obtaining your medications?: No  Social/Family/Support Systems Patient Roles: Parent (Has a daughter.) Contact Information: Terence Lux - daughter - [phone] Anticipated Caregiver: Dtr and family Ability/Limitations of Caregiver: Dtr has worked out 24/7 coverage for patient after  discharge home from rehab. Caregiver Availability: 24/7 Discharge Plan Discussed with Primary Caregiver: Yes Is Caregiver In Agreement with Plan?: Yes Does Caregiver/Family have Issues with Lodging/Transportation while Pt is in Rehab?: No  Pt's sister, Nunzio Cory, to come stay with her to assist her in her home after discharge from CIR  Goals Patient/Family Goal for Rehab: PT/OT/SLP supervision to min assist goals Expected length of stay: 7 to 10 days Pt/Family Agrees to Admission and willing to participate: Yes Program Orientation Provided & Reviewed with Pt/Caregiver Including Roles  & Responsibilities: Yes  Decrease burden of Care through IP rehab admission: N/A  Possible need for SNF placement upon discharge: Not planned  Patient Condition: I have reviewed medical records from [facility], spoken with CSW, and daughter. I discussed via phone for inpatient rehabilitation assessment.  Patient will benefit from ongoing PT, OT, and SLP, can actively participate in 3 hours of therapy a day 5 days of the week, and can make measurable gains during the admission.  Patient will also benefit from the coordinated team approach during an Inpatient Acute Rehabilitation admission.  The patient will receive intensive therapy as well as Rehabilitation physician, nursing, social worker, and care management interventions.  Due to bladder management, bowel management, safety, skin/wound care, disease management, medication administration, pain management, and patient education the patient requires 24 hour a day rehabilitation nursing.  The patient is currently min assist with mobility and basic ADLs.  Discharge setting and therapy post discharge at home with home health is anticipated.  Patient has agreed to participate in the Acute Inpatient Rehabilitation Program and will admit today.  Preadmission Screen Completed By:  Cleatrice Burke, 01/02/2021 10:03  AM ______________________________________________________________________   Discussed status with Dr. Ranell Patrick on  01/02/2021 at 1003 and received approval for admission today.  Admission Coordinator:  Cleatrice Burke, RN, time  1003 Date 12/23/2020   Assessment/Plan: Diagnosis: Left frontal, parietal, temporal, and basal ganglia infarcts Does the need for close, 24 hr/day Medical supervision in concert with the patient's rehab needs make it unreasonable for this patient to be served in a less intensive setting? Yes Co-Morbidities requiring supervision/potential complications: overweight (BMI 25.74), constipation, joint pain, myalgias, grade 1 diastolic dysfunction, weakness, impaired speech Due to bladder management, bowel management, safety, skin/wound care, disease management, medication administration, pain management, and patient education, does the patient require 24 hr/day rehab nursing? Yes Does the patient require coordinated care of a physician, rehab nurse, PT, OT, and SLP to address physical and functional deficits in the context of the above medical diagnosis(es)? Yes Addressing deficits in the following areas: balance, endurance, locomotion, strength, transferring, bowel/bladder control, bathing, dressing, feeding, grooming, toileting, cognition, speech, language, and psychosocial support Can the patient actively participate in an intensive therapy program of at least 3 hrs of therapy 5 days a week? Yes The potential for patient to make measurable gains while on inpatient rehab is excellent Anticipated functional outcomes upon discharge from inpatient rehab: supervision PT, supervision OT, supervision SLP Estimated rehab length of stay to reach the above functional goals is: 5-7 days Anticipated discharge destination: Home 10. Overall Rehab/Functional Prognosis: excellent  MD Signature: Leeroy Cha, MD

## 2020-12-28 NOTE — Progress Notes (Signed)
IP rehab admissions - I spoke with patient's daughter by phone.  Daughter would like inpatient rehab at [facility] and then home with family.  Daughter tells me that family has arranged for 24/7 care after discharge home.  I will begin pre-auth with insurance carrier and let you know when I hear back from insurance case manager.  Call for questions.  [phone]

## 2020-12-28 NOTE — Progress Notes (Signed)
PROGRESS NOTE    Natasha Vazquez  DTO:[id number] DOB: October 06, 1950 DOA: 12/25/2020 PCP: Carol Ada, MD    Brief Narrative:  Patient with history of hypertension hyperlipidemia who was found by her children in the evening at home unresponsive, unable to express and covered with feces.  EMS activated and brought to the ER.  Patient was found to have dense right upper extremity hemiplegia and evidence of left-sided stroke.   Assessment & Plan:   Principal Problem:   Acute ischemic stroke [facility]) Active Problems:   Essential hypertension   Mixed hyperlipidemia   Acute ischemic left MCA stroke (HCC)  Acute ischemic stroke, left sided multiple territory stroke: Clinical findings, unresponsiveness but likely unable to express, dense right upper extremity hemiplegia and right-sided facial droop. CT head findings, hypodense area left cerebrum. MRI of the brain, extensive left ACA and MCA territory infarct.  No mass-effect. CTA of the head and neck, no large vessel occlusion but multiple intracranial stenosis. 2D echocardiogram, essentially normal.  Normal ejection fraction.  No thrombosis. Antiplatelet therapy, none at home. LDL 77 Hemoglobin A1c, 5.6  -Passed bedside swallow evaluation by speech therapist and she was able to safely eat dysphagia to diet with nectar thick liquid.  She will need ongoing aggressive rehab. -Antiplatelet therapy, none at home.  Neurology recommended aspirin 325 mg daily and Plavix 75 mg daily for 3 months and then continue aspirin.  History of GI bleed 20 years ago associated with aspirin.  Currently taking Mobic and intermittent ibuprofen with no recurrence symptoms.  Recent negative colonoscopy.  Benefits outweigh risk.  start on antiplatelet therapy as above along with Protonix for gastric protection. -Antilipid therapy.  LDL 77.  On Crestor 10 mg at home.  Increase to 20 mg. -Seen by PT OT.  Recommended inpatient physical therapy.  Refer to acute inpatient  rehab. -Recommended 30-day event monitoring for A. fib, will refer to cardiology on discharge and hopefully this can be done when she goes to rehab.  Essential hypertension: Blood pressures are elevated today.  Goal is normotensive.  Resume amlodipine and losartan that she takes at home.    DVT prophylaxis: SCD's Start: 12/26/20 9833   Code Status: Full code Family Communication: Patient's sister at the bedside. Disposition Plan: Status is: Inpatient  Remains inpatient appropriate because: Needing acute inpatient rehab.  Medically stable.    Consultants:  Neurology  Procedures:  None  Antimicrobials:  None   Subjective: Patient seen and examined.  No events overnight.  Even though she cannot talk, she is expressing that she is fine.  Interactive but dysarthric.  Objective: Vitals:   12/27/20 1530 12/27/20 1937 12/28/20 0507 12/28/20 0911  BP: (!) 154/84 (!) 168/80 139/71 (!) 171/77  Pulse:  76 75 77  Resp: 20 18 18    Temp: 98 F (36.7 C) 98 F (36.7 C) 98 F (36.7 C) 98.3 F (36.8 C)  TempSrc: Oral  Oral Oral  SpO2: 98% 98% 100% 98%    Intake/Output Summary (Last 24 hours) at 12/28/2020 1133 Last data filed at 12/27/2020 1700 Gross per 24 hour  Intake 400 ml  Output --  Net 400 ml   There were no vitals filed for this visit.  Examination:  General exam: Appears calm and comfortable , interactive. Respiratory system: Clear to auscultation. Respiratory effort normal. Cardiovascular system: S1 & S2 heard, RRR. No JVD, murmurs, rubs, gallops or clicks. No pedal edema. Gastrointestinal system: Abdomen is nondistended, soft and nontender. No organomegaly or masses felt. Normal  bowel sounds heard. Central nervous system: Alert and oriented.  Expressive aphasia.  Right facial droop.  Right upper extremity flaccid.    Data Reviewed: I have personally reviewed following labs and imaging studies  CBC: Recent Labs  Lab 12/25/20 2035 12/25/20 2058  WBC 8.0   --   NEUTROABS 5.6  --   HGB 12.9 13.6  HCT 38.0 40.0  MCV 96.7  --   PLT 333  --    Basic Metabolic Panel: Recent Labs  Lab 12/25/20 2035 12/25/20 2058  NA 135 140  K 4.1 4.0  CL 103 105  CO2 21*  --   GLUCOSE 147* 143*  BUN 19 18  CREATININE 0.75 0.70  CALCIUM 9.6  --    GFR: CrCl cannot be calculated (Unknown ideal weight.). Liver Function Tests: Recent Labs  Lab 12/25/20 2035  AST 48*  ALT 37  ALKPHOS 112  BILITOT 0.7  PROT 8.0  ALBUMIN 4.4   No results for input(s): LIPASE, AMYLASE in the last 168 hours. No results for input(s): AMMONIA in the last 168 hours. Coagulation Profile: Recent Labs  Lab 12/25/20 2035  INR 1.0   Cardiac Enzymes: No results for input(s): CKTOTAL, CKMB, CKMBINDEX, TROPONINI in the last 168 hours. BNP (last 3 results) No results for input(s): PROBNP in the last 8760 hours. HbA1C: Recent Labs    12/26/20 0618  HGBA1C 5.6   CBG: No results for input(s): GLUCAP in the last 168 hours. Lipid Profile: Recent Labs    12/26/20 0618  CHOL 135  HDL 38*  LDLCALC 77  TRIG 102  CHOLHDL 3.6   Thyroid Function Tests: No results for input(s): TSH, T4TOTAL, FREET4, T3FREE, THYROIDAB in the last 72 hours. Anemia Panel: No results for input(s): VITAMINB12, FOLATE, FERRITIN, TIBC, IRON, RETICCTPCT in the last 72 hours. Sepsis Labs: No results for input(s): PROCALCITON, LATICACIDVEN in the last 168 hours.  Recent Results (from the past 240 hour(s))  Resp Panel by RT-PCR (Flu A&B, Covid) Nasopharyngeal Swab     Status: None   Collection Time: 12/25/20  8:40 PM   Specimen: Nasopharyngeal Swab; Nasopharyngeal(NP) swabs in vial transport medium  Result Value Ref Range Status   SARS Coronavirus 2 by RT PCR NEGATIVE NEGATIVE Final    Comment: (NOTE) SARS-CoV-2 target nucleic acids are NOT DETECTED.  The SARS-CoV-2 RNA is generally detectable in upper respiratory specimens during the acute phase of infection. The lowest concentration  of SARS-CoV-2 viral copies this assay can detect is 138 copies/mL. A negative result does not preclude SARS-Cov-2 infection and should not be used as the sole basis for treatment or other patient management decisions. A negative result may occur with  improper specimen collection/handling, submission of specimen other than nasopharyngeal swab, presence of viral mutation(s) within the areas targeted by this assay, and inadequate number of viral copies(<138 copies/mL). A negative result must be combined with clinical observations, patient history, and epidemiological information. The expected result is Negative.  Fact Sheet for Patients:  EntrepreneurPulse.com.au  Fact Sheet for Healthcare Providers:  IncredibleEmployment.be  This test is no t yet approved or cleared by the [place] FDA and  has been authorized for detection and/or diagnosis of SARS-CoV-2 by FDA under an Emergency Use Authorization (EUA). This EUA will remain  in effect (meaning this test can be used) for the duration of the COVID-19 declaration under Section 564(b)(1) of the Act, 21 U.S.C.section 360bbb-3(b)(1), unless the authorization is terminated  or revoked sooner.  Influenza A by PCR NEGATIVE NEGATIVE Final   Influenza B by PCR NEGATIVE NEGATIVE Final    Comment: (NOTE) The Xpert Xpress SARS-CoV-2/FLU/RSV plus assay is intended as an aid in the diagnosis of influenza from Nasopharyngeal swab specimens and should not be used as a sole basis for treatment. Nasal washings and aspirates are unacceptable for Xpert Xpress SARS-CoV-2/FLU/RSV testing.  Fact Sheet for Patients: EntrepreneurPulse.com.au  Fact Sheet for Healthcare Providers: IncredibleEmployment.be  This test is not yet approved or cleared by the [place] FDA and has been authorized for detection and/or diagnosis of SARS-CoV-2 by FDA under an Emergency Use  Authorization (EUA). This EUA will remain in effect (meaning this test can be used) for the duration of the COVID-19 declaration under Section 564(b)(1) of the Act, 21 U.S.C. section 360bbb-3(b)(1), unless the authorization is terminated or revoked.  Performed at [facility], [address], [place], [place] [address]          Radiology Studies: MR BRAIN WO CONTRAST  Result Date: 12/26/2020 CLINICAL DATA:  Neuro deficit, acute, stroke suspected EXAM: MRI HEAD WITHOUT CONTRAST TECHNIQUE: Multiplanar, multiecho pulse sequences of the brain and surrounding structures were obtained without intravenous contrast. COMPARISON:  December 25, 2020 CT/CTA.  MRI 08/12/2016. FINDINGS: Brain: Extensive acute infarcts throughout the left cerebrum, including left frontal, parietal, and temporal lobes and left basal ganglia. Associated edema without significant mass effect. No midline shift. No hydrocephalus, mass lesion, or extra-axial fluid collection. Additional moderate scattered T2/FLAIR hyperintensities in the white matter, nonspecific but compatible with chronic microvascular ischemic disease. Small amount of curvilinear susceptibility artifact in the left frontal parietal region in the region of acute infarct, likely petechial hemorrhage. No mass occupying hemorrhage. Vascular: Major arterial flow voids are maintained at the skull base. Further evaluated on CTA from December 25, 2020. Skull and upper cervical spine: Normal marrow signal. Sinuses/Orbits: Clear sinuses.  Unremarkable orbits. Other: No sizable mastoid effusions. IMPRESSION: 1. Extensive acute infarcts throughout the left cerebrum, including left frontal, parietal, and temporal lobes and left basal ganglia. Mild petechial hemorrhage without mass occupying hemorrhagic transformation. Edema without significant mass effect. 2. Moderate chronic microvascular ischemic disease. Electronically Signed   By: Margaretha Sheffield M.D.   On: 12/26/2020 12:18         Scheduled Meds:  amLODipine  2.5 mg Oral Daily   aspirin  325 mg Oral Daily   clopidogrel  75 mg Oral Daily   losartan  100 mg Oral Daily   pantoprazole  40 mg Oral Daily   rosuvastatin  20 mg Oral Daily   Or   rosuvastatin  20 mg Per Tube Daily   Continuous Infusions:   LOS: 2 days    Time spent: 25 minutes    Barb Merino, MD [facility] Pager [phone]

## 2020-12-28 NOTE — Progress Notes (Addendum)
Physical Therapy Treatment Patient Details Name: Natasha Vazquez MRN: [id number] DOB: 11-17-1950 Today's Date: 12/28/2020   History of Present Illness Natasha Vazquez is a 70 y.o. female with medical history significant for hypertension, hyperlipidemia who presents to the emergency department via EMS due to altered mental status.  Patient was unable to provide history, history was obtained from ED physician and ED medical record as well as from son and daughter at bedside.  Per report, patient was last seen normal on 11/21 around 2 PM, son states that patient was seen on the floor, it was unknown how long she has been on the floor.  She was not responding, EMS was activated and patient was taken to the ED for further evaluation and management.    PT Comments    Patient presents in bed awake, alert, and agreeable for therapy w/ family member at bedside. Patient transitioned to EOB w/ Min guard assist, required increased time, continues to demonstrate R UE neglect and R sided physical neglect. Patient performed a seated exercise program at EOB, required verbal and tactile cuing to initiate appropriate movements with fair carryover. Patient ambulated in hallway using a quad cane, demonstrated overall improved R ankle dorsiflexion, foot clearance, and stability, continues to demonstrate increasing R knee flexion in stance phase and decreased dorsiflexion w/ fatigue. Patient tolerated sitting up in chair after therapy w/ family member in room -nurse notified. Patient will benefit from continued skilled physical therapy in hospital and recommended venue below to increase strength, balance, endurance for safe ADLs and gait.   Recommendations for follow up therapy are one component of a multi-disciplinary discharge planning process, led by the attending physician.  Recommendations may be updated based on patient status, additional functional criteria and insurance authorization.  Follow Up Recommendations   Acute inpatient rehab (3hours/day)     Assistance Recommended at Discharge Frequent or constant Supervision/Assistance  Equipment Recommendations  None recommended by PT    Recommendations for Other Services       Precautions / Restrictions Precautions Precautions: Fall Precaution Comments: RUE physical neglect. Right side hemiplegia. Aphasic Restrictions Weight Bearing Restrictions: No     Mobility  Bed Mobility Overal bed mobility: Needs Assistance Bed Mobility: Supine to Sit     Supine to sit: Min guard;HOB elevated     General bed mobility comments: Demonstrated slow labored movement, R UE neglect, required assist to move R UE and increased time due to R UE neglect    Transfers Overall transfer level: Needs assistance Equipment used: Quad cane Transfers: Sit to/from Stand Sit to Stand: Min guard;Min assist           General transfer comment: Used one hand on bed and back of B LE against bed to complete sit to stand, maintained static balance upon standing w/ Min guard    Ambulation/Gait Ambulation/Gait assistance: Min assist;Mod assist Gait Distance (Feet): 65 Feet Assistive device: Quad cane Gait Pattern/deviations: Decreased step length - right;Decreased step length - left;Decreased stride length;Knee flexed in stance - right;Shuffle Gait velocity: Decreased     General Gait Details: Demonstrated a slow cadence, improved R foot clearance, shuffling of feet, as patient fatigued R foot clearance decreased and R knee flexion occured during the stance phase   Stairs             Wheelchair Mobility    Modified Rankin (Stroke Patients Only)       Balance Overall balance assessment: Needs assistance Sitting-balance support: Feet supported;Single extremity supported Sitting  balance-Leahy Scale: Fair Sitting balance - Comments: Good seated EOB   Standing balance support: Single extremity supported;During functional activity Standing  balance-Leahy Scale: Fair Standing balance comment: Fair using quad cane until patient becomes fatigued                            Cognition Arousal/Alertness: Awake/alert Behavior During Therapy: Impulsive Overall Cognitive Status: Difficult to assess                                          Exercises General Exercises - Lower Extremity Ankle Circles/Pumps: Seated;AROM;Strengthening;Both;10 reps Long Arc Quad: Seated;AROM;Strengthening;Both;10 reps Hip Flexion/Marching: Seated;AROM;Strengthening;Both;10 reps    General Comments        Pertinent Vitals/Pain Pain Assessment: Faces Faces Pain Scale: No hurt    Home Living                          Prior Function            PT Goals (current goals can now be found in the care plan section) Acute Rehab PT Goals Patient Stated Goal: Return home after Rehab. PT Goal Formulation: With patient/family Time For Goal Achievement: 01/09/21 Potential to Achieve Goals: Good Progress towards PT goals: Progressing toward goals    Frequency    Min 4X/week      PT Plan Current plan remains appropriate    Co-evaluation              AM-PAC PT "6 Clicks" Mobility   Outcome Measure  Help needed turning from your back to your side while in a flat bed without using bedrails?: A Little Help needed moving from lying on your back to sitting on the side of a flat bed without using bedrails?: A Lot Help needed moving to and from a bed to a chair (including a wheelchair)?: A Lot Help needed standing up from a chair using your arms (e.g., wheelchair or bedside chair)?: A Little Help needed to walk in hospital room?: A Lot Help needed climbing 3-5 steps with a railing? : A Lot 6 Click Score: 14    End of Session Equipment Utilized During Treatment: Gait belt Activity Tolerance: Patient tolerated treatment well;Patient limited by fatigue Patient left: in chair;with call bell/phone within  reach;with family/visitor present Nurse Communication: Mobility status PT Visit Diagnosis: Unsteadiness on feet (R26.81);Other abnormalities of gait and mobility (R26.89);Muscle weakness (generalized) (M62.81)     Time: 9371-6967 PT Time Calculation (min) (ACUTE ONLY): 22 min  Charges:  $Gait Training: 8-22 mins $Therapeutic Exercise: 8-22 mins                     Cassie Jones, SPT  During this treatment session, the therapist was present, participating in and directing the treatment.  10:59 AM, 12/28/20 Lonell Grandchild, MPT Physical Therapist with [facility] [phone] office [phone] mobile phone

## 2020-12-28 NOTE — Care Management Important Message (Signed)
Important Message  Patient Details  Name: Natasha Vazquez MRN: [id number] Date of Birth: [birth year]   Medicare Important Message Given:  Yes  Reviewed Medicare IM with Terence Lux, daughter, at [phone].  Copy of Medicare IM sent securely to daughter's attention at [email or web address].    Dannette Barbara 12/28/2020, 3:47 PM

## 2020-12-28 NOTE — Progress Notes (Signed)
Speech Language Pathology Treatment: Dysphagia  Patient Details Name: Natasha Vazquez MRN: [id number] DOB: March 30, 1950 Today's Date: 12/28/2020 Time: 1501-1530 SLP Time Calculation (min) (ACUTE ONLY): 29 min  Assessment / Plan / Recommendation Clinical Impression  Pt seen for dysphagia tx d/t Radiology being unable to complete MBSS this date d/t scheduling; pt administered Nectar-thickened liquids, thin liquids, puree and soft solids with right anterior loss (min), multiple swallows with nectar-thickened liquids d/t impulsivity/volume of nectar thickened liquids without moderate verbal/tactile cues provided by SLP, but no coughing/vocal quality changes noted during intake of nectar-thickened liquids and/or small sips of thin via cup/tsp.  Pt/family educated re: aspiration risk and inability for MBS to be completed this date, but small supervised (FULL) sips and/or ice chips given 1 at a time with oral care provided prior to and after intake of thin/ice chips recommended for pleasure only until objective assessment able to be completed.  Recommend continuing Dysphagia 2/nectar-thickened liquids via cup (no straw) and FULL supervision during intake of current diet for aspiration precautions and swallowing precautions including: small bites/sips, lingual sweep on right to clear buccal cavity and slow rate d/t aspiration risk.  Family/pt in agreement with plan; ST will continue to f/u during acute stay for dysphagia/aphasia tx.  HPI HPI: Natasha Vazquez is a 70 y.o. female with medical history significant for hypertension, hyperlipidemia who presents to the emergency department via EMS due to altered mental status.  Patient was unable to provide history, history was obtained from ED physician and ED medical record as well as from son and daughter at bedside.  Per report, patient was last seen normal on 11/21 around 2 PM, son states that patient was seen on the floor, it was unknown how long she has been on the  floor.  She was not responding, EMS was activated and patient was taken to the ED for further evaluation and management. 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  Mild petechial hemorrhage without mass occupying hemorrhagic  transformation. Edema without significant mass effect. Moderate chronic microvascular ischemic disease. BSE/SLE completed with global aphasia; Regular/NTL recommended.      SLP Plan  Continue with current plan of care      Recommendations for follow up therapy are one component of a multi-disciplinary discharge planning process, led by the attending physician.  Recommendations may be updated based on patient status, additional functional criteria and insurance authorization.    Recommendations  Diet recommendations: Dysphagia 2 (fine chop);Nectar-thick liquid Liquids provided via: Teaspoon;Cup;No straw Medication Administration: Whole meds with puree Supervision: Staff to assist with self feeding;Full supervision/cueing for compensatory strategies Compensations: Minimize environmental distractions;Slow rate;Small sips/bites;Lingual sweep for clearance of pocketing Postural Changes and/or Swallow Maneuvers: Seated upright 90 degrees                Oral Care Recommendations: Oral care BID;Oral care prior to ice chip/H20 Follow Up Recommendations: Acute inpatient rehab (3hours/day) Assistance recommended at discharge: Frequent or constant Supervision/Assistance SLP Visit Diagnosis: Aphasia (R47.01);Dysphagia, unspecified (R13.10) Plan: Continue with current plan of care                       Elvina Sidle, M.S., CCC-SLP  12/28/2020, 4:35 PM

## 2020-12-29 DIAGNOSIS — I639 Cerebral infarction, unspecified: Secondary | ICD-10-CM | POA: Diagnosis not present

## 2020-12-29 NOTE — Progress Notes (Signed)
PROGRESS NOTE    Natasha Vazquez  VQQ:[id number] DOB: 11/15/1950 DOA: 12/25/2020 PCP: Carol Ada, MD    Brief Narrative:  Patient with history of hypertension hyperlipidemia who was found by her children in the evening at home unresponsive, unable to express and covered with feces.  EMS activated and brought to the ER.  Patient was found to have dense right upper extremity hemiplegia and evidence of left-sided stroke.   Assessment & Plan:   Principal Problem:   Acute ischemic stroke [facility]) Active Problems:   Essential hypertension   Mixed hyperlipidemia   Acute ischemic left MCA stroke (HCC)  Acute ischemic stroke, left sided multiple territory stroke: Clinical findings, unresponsiveness but likely unable to express, dense right upper extremity hemiplegia and right-sided facial droop. CT head findings, hypodense area left cerebrum. MRI of the brain, extensive left ACA and MCA territory infarct.  No mass-effect. CTA of the head and neck, no large vessel occlusion but multiple intracranial stenosis. 2D echocardiogram, essentially normal.  Normal ejection fraction.  No thrombosis. Antiplatelet therapy, none at home. LDL 77 Hemoglobin A1c, 5.6  -Passed bedside swallow evaluation by speech therapist and she was able to safely eat dysphagia to diet with nectar thick liquid.  She will need ongoing aggressive rehab. -Antiplatelet therapy, none at home.  Neurology recommended aspirin 325 mg daily and Plavix 75 mg daily for 3 months and then continue aspirin.  History of GI bleed 20 years ago associated with aspirin.  Currently taking Mobic and intermittent ibuprofen with no recurrence symptoms.  Recent negative colonoscopy.  Benefits outweigh risk.  start on antiplatelet therapy as above along with Protonix for gastric protection. -Antilipid therapy.  LDL 77.  On Crestor 10 mg at home.  Increase to 20 mg. -Seen by PT OT.  Recommended inpatient physical therapy.  Refer to acute inpatient  rehab. -Recommended 30-day event monitoring for A. fib, will refer to cardiology on discharge and hopefully this can be done when she goes to rehab.  Essential hypertension: Blood pressures are elevated today.  Goal is normotensive.  Resume amlodipine and losartan that she takes at home.    DVT prophylaxis: SCD's Start: 12/26/20 4332   Code Status: Full code Family Communication: Family member at bedside. Disposition Plan: Status is: Inpatient  Remains inpatient appropriate because: Needing acute inpatient rehab.  Medically stable.    Consultants:  Neurology  Procedures:  None  Antimicrobials:  None   Subjective:  Patient seen and examined.  No overnight events.  She denies any complaints.  She is trying to speak but difficult.  Objective: Vitals:   12/28/20 1843 12/28/20 2245 12/29/20 0245 12/29/20 0645  BP: (!) 166/79 (!) 144/80 (!) 147/84 (!) 151/84  Pulse: 72 70 75 78  Resp:  18 18 18   Temp: 98.6 F (37 C) 97.7 F (36.5 C) 97.8 F (36.6 C) 97.9 F (36.6 C)  TempSrc: Oral Oral Oral Oral  SpO2: 97% 97% 98% 100%  Weight:      Height:        Intake/Output Summary (Last 24 hours) at 12/29/2020 1036 Last data filed at 12/29/2020 0600 Gross per 24 hour  Intake 360 ml  Output --  Net 360 ml   Filed Weights   12/28/20 1342  Weight: 76.8 kg    Examination:  General exam: Appears calm and comfortable , interactive but severely dysarthric. Respiratory system: Clear to auscultation. Respiratory effort normal. Cardiovascular system: S1 & S2 heard, RRR. No JVD, murmurs, rubs, gallops or clicks. No  pedal edema. Gastrointestinal system: Abdomen is nondistended, soft and nontender. No organomegaly or masses felt. Normal bowel sounds heard. Central nervous system: Alert and oriented.  Expressive aphasia.  Right facial droop.  Right upper extremity flaccid.    Data Reviewed: I have personally reviewed following labs and imaging studies  CBC: Recent Labs  Lab  12/25/20 2035 12/25/20 2058  WBC 8.0  --   NEUTROABS 5.6  --   HGB 12.9 13.6  HCT 38.0 40.0  MCV 96.7  --   PLT 333  --    Basic Metabolic Panel: Recent Labs  Lab 12/25/20 2035 12/25/20 2058  NA 135 140  K 4.1 4.0  CL 103 105  CO2 21*  --   GLUCOSE 147* 143*  BUN 19 18  CREATININE 0.75 0.70  CALCIUM 9.6  --    GFR: Estimated Creatinine Clearance: 70.4 mL/min (by C-G formula based on SCr of 0.7 mg/dL). Liver Function Tests: Recent Labs  Lab 12/25/20 2035  AST 48*  ALT 37  ALKPHOS 112  BILITOT 0.7  PROT 8.0  ALBUMIN 4.4   No results for input(s): LIPASE, AMYLASE in the last 168 hours. No results for input(s): AMMONIA in the last 168 hours. Coagulation Profile: Recent Labs  Lab 12/25/20 2035  INR 1.0   Cardiac Enzymes: No results for input(s): CKTOTAL, CKMB, CKMBINDEX, TROPONINI in the last 168 hours. BNP (last 3 results) No results for input(s): PROBNP in the last 8760 hours. HbA1C: No results for input(s): HGBA1C in the last 72 hours.  CBG: No results for input(s): GLUCAP in the last 168 hours. Lipid Profile: No results for input(s): CHOL, HDL, LDLCALC, TRIG, CHOLHDL, LDLDIRECT in the last 72 hours.  Thyroid Function Tests: No results for input(s): TSH, T4TOTAL, FREET4, T3FREE, THYROIDAB in the last 72 hours. Anemia Panel: No results for input(s): VITAMINB12, FOLATE, FERRITIN, TIBC, IRON, RETICCTPCT in the last 72 hours. Sepsis Labs: No results for input(s): PROCALCITON, LATICACIDVEN in the last 168 hours.  Recent Results (from the past 240 hour(s))  Resp Panel by RT-PCR (Flu A&B, Covid) Nasopharyngeal Swab     Status: None   Collection Time: 12/25/20  8:40 PM   Specimen: Nasopharyngeal Swab; Nasopharyngeal(NP) swabs in vial transport medium  Result Value Ref Range Status   SARS Coronavirus 2 by RT PCR NEGATIVE NEGATIVE Final    Comment: (NOTE) SARS-CoV-2 target nucleic acids are NOT DETECTED.  The SARS-CoV-2 RNA is generally detectable in upper  respiratory specimens during the acute phase of infection. The lowest concentration of SARS-CoV-2 viral copies this assay can detect is 138 copies/mL. A negative result does not preclude SARS-Cov-2 infection and should not be used as the sole basis for treatment or other patient management decisions. A negative result may occur with  improper specimen collection/handling, submission of specimen other than nasopharyngeal swab, presence of viral mutation(s) within the areas targeted by this assay, and inadequate number of viral copies(<138 copies/mL). A negative result must be combined with clinical observations, patient history, and epidemiological information. The expected result is Negative.  Fact Sheet for Patients:  EntrepreneurPulse.com.au  Fact Sheet for Healthcare Providers:  IncredibleEmployment.be  This test is no t yet approved or cleared by the [place] FDA and  has been authorized for detection and/or diagnosis of SARS-CoV-2 by FDA under an Emergency Use Authorization (EUA). This EUA will remain  in effect (meaning this test can be used) for the duration of the COVID-19 declaration under Section 564(b)(1) of the Act, 21 U.S.C.section 360bbb-3(b)(1),  unless the authorization is terminated  or revoked sooner.       Influenza A by PCR NEGATIVE NEGATIVE Final   Influenza B by PCR NEGATIVE NEGATIVE Final    Comment: (NOTE) The Xpert Xpress SARS-CoV-2/FLU/RSV plus assay is intended as an aid in the diagnosis of influenza from Nasopharyngeal swab specimens and should not be used as a sole basis for treatment. Nasal washings and aspirates are unacceptable for Xpert Xpress SARS-CoV-2/FLU/RSV testing.  Fact Sheet for Patients: EntrepreneurPulse.com.au  Fact Sheet for Healthcare Providers: IncredibleEmployment.be  This test is not yet approved or cleared by the [place] FDA and has been  authorized for detection and/or diagnosis of SARS-CoV-2 by FDA under an Emergency Use Authorization (EUA). This EUA will remain in effect (meaning this test can be used) for the duration of the COVID-19 declaration under Section 564(b)(1) of the Act, 21 U.S.C. section 360bbb-3(b)(1), unless the authorization is terminated or revoked.  Performed at [facility], [address], [place], [place] [address]          Radiology Studies: No results found.      Scheduled Meds:  amLODipine  2.5 mg Oral Daily   aspirin  325 mg Oral Daily   clopidogrel  75 mg Oral Daily   losartan  100 mg Oral Daily   pantoprazole  40 mg Oral Daily   rosuvastatin  20 mg Oral Daily   Or   rosuvastatin  20 mg Per Tube Daily   Continuous Infusions:   LOS: 3 days    Time spent: 25 minutes    Barb Merino, MD [facility] Pager [phone]

## 2020-12-29 NOTE — TOC Progression Note (Signed)
Transition of Care [facility]) - Progression Note    Patient Details  Name: SINIYAH EVANGELIST MRN: [id number] Date of Birth: 1950/08/08  Transition of Care [facility]) CM/SW Contact  Natasha Bence, LCSW Phone Number: 12/29/2020, 3:34 PM  Clinical Narrative:    CSW LVM and messaged Eugenia with CIR to inquire if auth received. CSW received no answer. TOC to follow.   Expected Discharge Plan: IP Rehab Facility Barriers to Discharge: Continued Medical Work up  Expected Discharge Plan and Services Expected Discharge Plan: [place]       Living arrangements for the past 2 months: [place]                                       Social Determinants of Health (SDOH) Interventions    Readmission Risk Interventions No flowsheet data found.

## 2020-12-30 DIAGNOSIS — I639 Cerebral infarction, unspecified: Secondary | ICD-10-CM | POA: Diagnosis not present

## 2020-12-30 MED ORDER — SENNOSIDES-DOCUSATE SODIUM 8.6-50 MG PO TABS
1.0000 | ORAL_TABLET | Freq: Two times a day (BID) | ORAL | Status: DC
  Administered 2020-12-30 – 2021-01-02 (×6): 1 via ORAL
  Filled 2020-12-30 (×6): qty 1

## 2020-12-30 NOTE — Progress Notes (Signed)
PROGRESS NOTE    Natasha Vazquez  ZTI:[id number] DOB: [birth year] DOA: 12/25/2020 PCP: Carol Ada, MD    Brief Narrative:  Patient with history of hypertension hyperlipidemia who was found by her children in the evening at home unresponsive, unable to express and covered with feces.  EMS activated and brought to the ER.  Patient was found to have dense right upper extremity hemiplegia and evidence of left-sided stroke.   Assessment & Plan:   Principal Problem:   Acute ischemic stroke [facility]) Active Problems:   Essential hypertension   Mixed hyperlipidemia   Acute ischemic left MCA stroke (HCC)  Acute ischemic stroke, left sided multiple territory stroke: Clinical findings, unresponsiveness but likely unable to express, dense right upper extremity hemiplegia and right-sided facial droop. CT head findings, hypodense area left cerebrum. MRI of the brain, extensive left ACA and MCA territory infarct.  No mass-effect. CTA of the head and neck, no large vessel occlusion but multiple intracranial stenosis. 2D echocardiogram, essentially normal.  Normal ejection fraction.  No thrombosis. Antiplatelet therapy, none at home. LDL 77 Hemoglobin A1c, 5.6  -Currently medically stabilizing.  On dysphagia 2 diet with nectar thick liquids.  -Antiplatelet therapy, none at home.  Neurology recommended aspirin 325 mg daily and Plavix 75 mg daily for 3 months and then continue aspirin.  History of GI bleed 20 years ago associated with aspirin.  Patient was taking Mobic and intermittent ibuprofen with no recurrence symptoms.  Recent negative colonoscopy.  Benefits outweigh risk.  started on antiplatelet therapy as above along with Protonix for gastric protection. -Antilipid therapy.  LDL 77.  On Crestor 10 mg at home.  Increased to 20 mg. -Seen by PT OT.  Recommended inpatient physical therapy.  Refer to acute inpatient rehab. -Recommended 30-day event monitoring for A. fib, will refer to cardiology on  discharge and hopefully this can be done when she goes to rehab. -No arrhythmias on telemetry monitoring for last 5 days.  Essential hypertension: Blood pressures are at goal.  Home medications were resumed.  DVT prophylaxis: SCD's Start: 12/26/20 8250   Code Status: Full code Family Communication: Daughter at the bedside. Disposition Plan: Status is: Inpatient  Remains inpatient appropriate because: Needing acute inpatient rehab.  Medically stable.    Consultants:  Neurology  Procedures:  None  Antimicrobials:  None   Subjective:  Patient seen and examined.  She got out of the bed to the chair with the help of her daughter.  Lower extremity has no problem.  Cannot move right hand. She is interactive but severe dysarthria.  Answers no to everything because she could not make any other words.  Objective: Vitals:   12/29/20 1829 12/29/20 1841 12/29/20 2128 12/30/20 0358  BP: (!) 175/76 (!) 154/80 (!) 145/58 138/79  Pulse: 73 78 77 75  Resp:  20 15 16   Temp: 97.8 F (36.6 C) 98.3 F (36.8 C) 98.6 F (37 C)   TempSrc: Oral Oral Oral   SpO2: 95% 97% 97% 95%  Weight:      Height:        Intake/Output Summary (Last 24 hours) at 12/30/2020 1145 Last data filed at 12/30/2020 0900 Gross per 24 hour  Intake 840 ml  Output --  Net 840 ml   Filed Weights   12/28/20 1342  Weight: 76.8 kg    Examination:  General exam: Appears calm and comfortable , interactive but severely dysarthric. Respiratory system: Clear to auscultation. Respiratory effort normal. Cardiovascular system: S1 & S2 heard,  RRR. No JVD, murmurs, rubs, gallops or clicks. No pedal edema. Gastrointestinal system: Abdomen is nondistended, soft and nontender. No organomegaly or masses felt. Normal bowel sounds heard. Central nervous system: Alert and oriented.  Expressive aphasia.  Right facial droop.  Right upper extremity flaccid.    Data Reviewed: I have personally reviewed following labs and  imaging studies  CBC: Recent Labs  Lab 12/25/20 2035 12/25/20 2058  WBC 8.0  --   NEUTROABS 5.6  --   HGB 12.9 13.6  HCT 38.0 40.0  MCV 96.7  --   PLT 333  --    Basic Metabolic Panel: Recent Labs  Lab 12/25/20 2035 12/25/20 2058  NA 135 140  K 4.1 4.0  CL 103 105  CO2 21*  --   GLUCOSE 147* 143*  BUN 19 18  CREATININE 0.75 0.70  CALCIUM 9.6  --    GFR: Estimated Creatinine Clearance: 70.4 mL/min (by C-G formula based on SCr of 0.7 mg/dL). Liver Function Tests: Recent Labs  Lab 12/25/20 2035  AST 48*  ALT 37  ALKPHOS 112  BILITOT 0.7  PROT 8.0  ALBUMIN 4.4   No results for input(s): LIPASE, AMYLASE in the last 168 hours. No results for input(s): AMMONIA in the last 168 hours. Coagulation Profile: Recent Labs  Lab 12/25/20 2035  INR 1.0   Cardiac Enzymes: No results for input(s): CKTOTAL, CKMB, CKMBINDEX, TROPONINI in the last 168 hours. BNP (last 3 results) No results for input(s): PROBNP in the last 8760 hours. HbA1C: No results for input(s): HGBA1C in the last 72 hours.  CBG: No results for input(s): GLUCAP in the last 168 hours. Lipid Profile: No results for input(s): CHOL, HDL, LDLCALC, TRIG, CHOLHDL, LDLDIRECT in the last 72 hours.  Thyroid Function Tests: No results for input(s): TSH, T4TOTAL, FREET4, T3FREE, THYROIDAB in the last 72 hours. Anemia Panel: No results for input(s): VITAMINB12, FOLATE, FERRITIN, TIBC, IRON, RETICCTPCT in the last 72 hours. Sepsis Labs: No results for input(s): PROCALCITON, LATICACIDVEN in the last 168 hours.  Recent Results (from the past 240 hour(s))  Resp Panel by RT-PCR (Flu A&B, Covid) Nasopharyngeal Swab     Status: None   Collection Time: 12/25/20  8:40 PM   Specimen: Nasopharyngeal Swab; Nasopharyngeal(NP) swabs in vial transport medium  Result Value Ref Range Status   SARS Coronavirus 2 by RT PCR NEGATIVE NEGATIVE Final    Comment: (NOTE) SARS-CoV-2 target nucleic acids are NOT DETECTED.  The  SARS-CoV-2 RNA is generally detectable in upper respiratory specimens during the acute phase of infection. The lowest concentration of SARS-CoV-2 viral copies this assay can detect is 138 copies/mL. A negative result does not preclude SARS-Cov-2 infection and should not be used as the sole basis for treatment or other patient management decisions. A negative result may occur with  improper specimen collection/handling, submission of specimen other than nasopharyngeal swab, presence of viral mutation(s) within the areas targeted by this assay, and inadequate number of viral copies(<138 copies/mL). A negative result must be combined with clinical observations, patient history, and epidemiological information. The expected result is Negative.  Fact Sheet for Patients:  EntrepreneurPulse.com.au  Fact Sheet for Healthcare Providers:  IncredibleEmployment.be  This test is no t yet approved or cleared by the [place] FDA and  has been authorized for detection and/or diagnosis of SARS-CoV-2 by FDA under an Emergency Use Authorization (EUA). This EUA will remain  in effect (meaning this test can be used) for the duration of the COVID-19 declaration  under Section 564(b)(1) of the Act, 21 U.S.C.section 360bbb-3(b)(1), unless the authorization is terminated  or revoked sooner.       Influenza A by PCR NEGATIVE NEGATIVE Final   Influenza B by PCR NEGATIVE NEGATIVE Final    Comment: (NOTE) The Xpert Xpress SARS-CoV-2/FLU/RSV plus assay is intended as an aid in the diagnosis of influenza from Nasopharyngeal swab specimens and should not be used as a sole basis for treatment. Nasal washings and aspirates are unacceptable for Xpert Xpress SARS-CoV-2/FLU/RSV testing.  Fact Sheet for Patients: EntrepreneurPulse.com.au  Fact Sheet for Healthcare Providers: IncredibleEmployment.be  This test is not yet approved or  cleared by the [place] FDA and has been authorized for detection and/or diagnosis of SARS-CoV-2 by FDA under an Emergency Use Authorization (EUA). This EUA will remain in effect (meaning this test can be used) for the duration of the COVID-19 declaration under Section 564(b)(1) of the Act, 21 U.S.C. section 360bbb-3(b)(1), unless the authorization is terminated or revoked.  Performed at [facility], [address] [place], [place] [address]          Radiology Studies: No results found.      Scheduled Meds:  amLODipine  2.5 mg Oral Daily   aspirin  325 mg Oral Daily   clopidogrel  75 mg Oral Daily   losartan  100 mg Oral Daily   pantoprazole  40 mg Oral Daily   rosuvastatin  20 mg Oral Daily   Or   rosuvastatin  20 mg Per Tube Daily   senna-docusate  1 tablet Oral BID   Continuous Infusions:   LOS: 4 days    Time spent: 25 minutes    Barb Merino, MD [facility] Pager [phone]

## 2020-12-31 DIAGNOSIS — I639 Cerebral infarction, unspecified: Secondary | ICD-10-CM | POA: Diagnosis not present

## 2020-12-31 NOTE — Progress Notes (Signed)
Speech Language Pathology Treatment: Dysphagia;Cognitive-Linquistic  Patient Details Name: Natasha Vazquez MRN: [id number] DOB: Aug 13, 1950 Today's Date: 12/31/2020 Time: 0962-8366 SLP Time Calculation (min) (ACUTE ONLY): 33 min  Assessment / Plan / Recommendation Clinical Impression  Pt seen for dysphagia and cognitive linguistic therapy. Family present for the visit. Pt presented with thin water and ginger ale via cup and straw sips. Pt with improved oral control, reduced labial spillage with use of the straw. No overt signs of reduced airway protection noted. She demonstrated mildly prolonged oral transit with solids and min lingual residuals which cleared with liquid wash. Will advance to D3/mech soft and thin liquids, straw ok, po medications whole in puree. Pt and family acknowledge recommendations. Pt will continue to need supervision for po intake due to reduced awareness to her right and apraxia.   Pt continues to present with severe expressive aphasia, moderate/severe receptive aphasia, and verbal apraxia. When she was seen on Thursday, she was only saying "no". She continues to perseverate on "no", but some other spontaneous and elicited words were produced (what, ok, yes, yeah, water, and ball). SLP provided max multimodality cues to elicit word productions and increase communication (phonemic cues, melodic intonation, choral cues for automatic utterance, single word sentence completion task, gestures, and modeling). Recommend continued therapy to address severe communication deficits. Ongoing family education completed.  HPI HPI: Natasha Vazquez is a 70 y.o. female with medical history significant for hypertension, hyperlipidemia who presents to the emergency department via EMS due to altered mental status.  Patient was unable to provide history, history was obtained from ED physician and ED medical record as well as from son and daughter at bedside.  Per report, patient was last seen normal  on 11/21 around 2 PM, son states that patient was seen on the floor, it was unknown how long she has been on the floor.  She was not responding, EMS was activated and patient was taken to the ED for further evaluation and management. 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  Mild petechial hemorrhage without mass occupying hemorrhagic  transformation. Edema without significant mass effect. Moderate chronic microvascular ischemic disease. BSE/SLE completed with global aphasia; Regular/NTL recommended.      SLP Plan  Continue with current plan of care      Recommendations for follow up therapy are one component of a multi-disciplinary discharge planning process, led by the attending physician.  Recommendations may be updated based on patient status, additional functional criteria and insurance authorization.    Recommendations  Diet recommendations: Dysphagia 3 (mechanical soft);Thin liquid Liquids provided via: Straw;Cup Medication Administration: Whole meds with puree Supervision: Full supervision/cueing for compensatory strategies;Staff to assist with self feeding Compensations: Minimize environmental distractions;Slow rate;Small sips/bites;Lingual sweep for clearance of pocketing Postural Changes and/or Swallow Maneuvers: Seated upright 90 degrees;Upright 30-60 min after meal                Oral Care Recommendations: Oral care BID Follow Up Recommendations: Acute inpatient rehab (3hours/day) Assistance recommended at discharge: Frequent or constant Supervision/Assistance SLP Visit Diagnosis: Aphasia (R47.01);Apraxia (R48.2);Dysphagia, unspecified (R13.10) Plan: Continue with current plan of care       Thank you,  Genene Churn, [place]                 [place]  12/31/2020, 4:30 PM

## 2020-12-31 NOTE — Plan of Care (Signed)
  Problem: Education: Goal: Knowledge of secondary prevention will improve (SELECT ALL) Outcome: Progressing Goal: Knowledge of patient specific risk factors will improve (INDIVIDUALIZE FOR PATIENT) Outcome: Progressing   Problem: Self-Care: Goal: Ability to participate in self-care as condition permits will improve Outcome: Progressing   

## 2020-12-31 NOTE — Progress Notes (Signed)
  Inpatient Rehabilitation Admissions Coordinator  MD at [facility] medicare/[employer] is requesting peer to peer with treating acute MD. I have notified Dr Sloan Leiter by secure chat and Amion of this request.  Danne Baxter, RN, MSN Rehab Admissions Coordinator [phone] 12/31/2020 3:14 PM

## 2020-12-31 NOTE — Progress Notes (Signed)
Peer to peer review was requested and call placed to physician . Waiting for call back. Will update.  Will discuss with insurance physician that Ms Aulds have acute stroke and deficits that will greatly benefit with aggressive rehab at inpatient rehab and she has potential to achieve good functional outcome and good quality of life.   420 PM  Received call back from insurance physician , they need an updated PT note with walker use. Notified CM and SW team about insurance requirements.

## 2020-12-31 NOTE — Progress Notes (Signed)
Central tele report called at [phone] pt SR 90.

## 2020-12-31 NOTE — Progress Notes (Addendum)
Inpatient Rehabilitation Admissions Coordinator   I have not received insurance approval yet for [facility] CIR admission. I have updated acute team and TOC as well as left a voicemail for her daughter of this information.   Danne Baxter, RN, MSN Rehab Admissions Coordinator [phone] 12/31/2020 10:45 AM  I spoke with her daughter by phone to update her on CIR admit.  Danne Baxter, RN, MSN Rehab Admissions Coordinator [phone] 12/31/2020 1:37 PM

## 2020-12-31 NOTE — Progress Notes (Signed)
Physical Therapy Treatment Patient Details Name: Natasha Vazquez MRN: [id number] DOB: 08-20-1950 Today's Date: 12/31/2020   History of Present Illness Natasha Vazquez is a 70 y.o. female with medical history significant for hypertension, hyperlipidemia who presents to the emergency department via EMS due to altered mental status.  Patient was unable to provide history, history was obtained from ED physician and ED medical record as well as from son and daughter at bedside.  Per report, patient was last seen normal on 11/21 around 2 PM, son states that patient was seen on the floor, it was unknown how long she has been on the floor.  She was not responding, EMS was activated and patient was taken to the ED for further evaluation and management.    PT Comments    Pt sitting edge of bed with family present upon arrival.  Pt agreeable and motivated to participate with therapy. Tp continues to have severe Rt UE neglect with all activities.  Completed gait today without AD and with self correct LOB.  Pt with decreased Rt foot clearance after initial 25 feet; cues to complete larger steps with heel to toe pattern, maintain forward gaze.  Completed AAROM to Rt UE in supine positioning.   Recommendations for follow up therapy are one component of a multi-disciplinary discharge planning process, led by the attending physician.  Recommendations may be updated based on patient status, additional functional criteria and insurance authorization.  Follow Up Recommendations        Assistance Recommended at Discharge    Equipment Recommendations       Recommendations for Other Services       Precautions / Restrictions Precautions Precautions: Fall Precaution Comments: RUE physical neglect. Right side hemiplegia. Aphasic     Mobility  Bed Mobility Overal bed mobility: Needs Assistance       Supine to sit: Min guard     General bed mobility comments: Demonstrated slow labored movement, R UE  neglect, required assist to move R UE and increased time due to R UE neglect    Transfers Overall transfer level: Needs assistance Equipment used: None Transfers: Sit to/from Stand Sit to Stand: Min guard                Ambulation/Gait Ambulation/Gait assistance: Min guard Gait Distance (Feet): 150 Feet Assistive device: Quad cane;None Gait Pattern/deviations: Decreased step length - right;Decreased step length - left;Decreased stride length;Knee flexed in stance - right;Shuffle       General Gait Details: decreased Rt foot clearance after initial 25 feet; cues to complete larger steps with heel to toe pattern, maintain forward gaze   Stairs             Wheelchair Mobility    Modified Rankin (Stroke Patients Only)       Balance Overall balance assessment: Modified Independent Sitting-balance support: Feet supported Sitting balance-Leahy Scale: Good                                      Cognition Arousal/Alertness: Awake/alert Behavior During Therapy: WFL for tasks assessed/performed Overall Cognitive Status: Within Functional Limits for tasks assessed                                 General Comments: unsure if pt always understands questions        Exercises General  Exercises - Upper Extremity Shoulder Flexion: AAROM;Right;5 reps Shoulder Horizontal ABduction: AAROM;Right;5 reps Elbow Flexion: AAROM;Right;5 reps    General Comments        Pertinent Vitals/Pain Pain Assessment: No/denies pain    Home Living                          Prior Function            PT Goals (current goals can now be found in the care plan section) Acute Rehab PT Goals Patient Stated Goal: Return home after Rehab. PT Goal Formulation: With patient/family Progress towards PT goals: Progressing toward goals    Frequency           PT Plan Current plan remains appropriate    Co-evaluation               AM-PAC PT "6 Clicks" Mobility   Outcome Measure  Help needed turning from your back to your side while in a flat bed without using bedrails?: A Little Help needed moving from lying on your back to sitting on the side of a flat bed without using bedrails?: A Little Help needed moving to and from a bed to a chair (including a wheelchair)?: A Lot Help needed standing up from a chair using your arms (e.g., wheelchair or bedside chair)?: A Little Help needed to walk in hospital room?: A Lot Help needed climbing 3-5 steps with a railing? : A Lot 6 Click Score: 15    End of Session Equipment Utilized During Treatment: Gait belt Activity Tolerance: Patient tolerated treatment well;Patient limited by fatigue Patient left: with family/visitor present;in bed;with call bell/phone within reach   PT Visit Diagnosis: Unsteadiness on feet (R26.81);Other abnormalities of gait and mobility (R26.89);Muscle weakness (generalized) (M62.81)     Time: 4580-9983 PT Time Calculation (min) (ACUTE ONLY): 24 min  Charges:  $Therapeutic Activity: 23-37 mins                     Teena Irani, PTA/CLT, WTA [phone]   Mare Ferrari, Josef Tourigny B 12/31/2020, 11:20 AM

## 2020-12-31 NOTE — Progress Notes (Signed)
PROGRESS NOTE    Natasha Vazquez  QBH:[id number] DOB: [birth year] DOA: 12/25/2020 PCP: Carol Ada, MD    Brief Narrative:  Patient with history of hypertension hyperlipidemia who was found by her children in the evening at home unresponsive, unable to express and covered with feces.  EMS activated and brought to the ER.  Patient was found to have dense right upper extremity hemiplegia and evidence of left-sided stroke.   Assessment & Plan:   Principal Problem:   Acute ischemic stroke [facility]) Active Problems:   Essential hypertension   Mixed hyperlipidemia   Acute ischemic left MCA stroke (HCC)  Acute ischemic stroke, left sided multiple territory stroke: Clinical findings, unresponsiveness but likely unable to express, dense right upper extremity hemiplegia and right-sided facial droop. CT head findings, hypodense area left cerebrum. MRI of the brain, extensive left ACA and MCA territory infarct.  No mass-effect. CTA of the head and neck, no large vessel occlusion but multiple intracranial stenosis. 2D echocardiogram, essentially normal.  Normal ejection fraction.  No thrombosis. Antiplatelet therapy, none at home. LDL 77 Hemoglobin A1c, 5.6  -Currently medically stabilizing.  On dysphagia 2 diet with nectar thick liquids.  -Antiplatelet therapy, none at home.  Neurology recommended aspirin 325 mg daily and Plavix 75 mg daily for 3 months and then continue aspirin.  History of GI bleed 20 years ago associated with aspirin.  Patient was taking Mobic and intermittent ibuprofen with no recurrence symptoms.  Recent negative colonoscopy.  Benefits outweigh risk.  started on antiplatelet therapy as above along with Protonix for gastric protection. -Antilipid therapy.  LDL 77.  On Crestor 10 mg at home.  Increased to 20 mg. -Seen by PT OT.  Recommended inpatient physical therapy.  Refer to acute inpatient rehab. -Recommended 30-day event monitoring for A. fib, no arrhythmias on telemetry  monitoring for last 5 days. - cardiology office contacted today to fit her with event monitoring before discharge.   Essential hypertension: Blood pressures are at goal.  Home medications were resumed.  DVT prophylaxis: SCD's Start: 12/26/20 0973   Code Status: Full code Family Communication: Sister at the bedside. Disposition Plan: Status is: Inpatient  Remains inpatient appropriate because: Needing acute inpatient rehab.  Medically stable.    Consultants:  Neurology  Procedures:  None  Antimicrobials:  None   Subjective:  Patient seen and examined.  Helped by family member to get out of the bed.  Right hand is flaccid but she is very well motivated and wants to move around.  Objective: Vitals:   12/30/20 1423 12/30/20 1831 12/30/20 2124 12/31/20 0551  BP: 138/77 136/66 (!) 139/54 (!) 132/53  Pulse: 80 72 79 80  Resp: 17  15 16   Temp: 98.7 F (37.1 C)  98 F (36.7 C) 97.7 F (36.5 C)  TempSrc: Oral  Oral Oral  SpO2: 97%  98% 94%  Weight:      Height:        Intake/Output Summary (Last 24 hours) at 12/31/2020 0932 Last data filed at 12/31/2020 0500 Gross per 24 hour  Intake 600 ml  Output --  Net 600 ml   Filed Weights   12/28/20 1342  Weight: 76.8 kg    Examination:  General exam: Appears calm and comfortable , interactive but severely dysarthric. Respiratory system: Clear to auscultation. Respiratory effort normal. Cardiovascular system: S1 & S2 heard, RRR. No JVD, murmurs, rubs, gallops or clicks. No pedal edema. Gastrointestinal system: Abdomen is nondistended, soft and nontender. No organomegaly or masses  felt. Normal bowel sounds heard. Central nervous system: Alert and oriented.  Expressive aphasia.  Right facial droop.  Right upper extremity flaccid.    Data Reviewed: I have personally reviewed following labs and imaging studies  CBC: Recent Labs  Lab 12/25/20 2035 12/25/20 2058  WBC 8.0  --   NEUTROABS 5.6  --   HGB 12.9 13.6  HCT  38.0 40.0  MCV 96.7  --   PLT 333  --    Basic Metabolic Panel: Recent Labs  Lab 12/25/20 2035 12/25/20 2058  NA 135 140  K 4.1 4.0  CL 103 105  CO2 21*  --   GLUCOSE 147* 143*  BUN 19 18  CREATININE 0.75 0.70  CALCIUM 9.6  --    GFR: Estimated Creatinine Clearance: 70.4 mL/min (by C-G formula based on SCr of 0.7 mg/dL). Liver Function Tests: Recent Labs  Lab 12/25/20 2035  AST 48*  ALT 37  ALKPHOS 112  BILITOT 0.7  PROT 8.0  ALBUMIN 4.4   No results for input(s): LIPASE, AMYLASE in the last 168 hours. No results for input(s): AMMONIA in the last 168 hours. Coagulation Profile: Recent Labs  Lab 12/25/20 2035  INR 1.0   Cardiac Enzymes: No results for input(s): CKTOTAL, CKMB, CKMBINDEX, TROPONINI in the last 168 hours. BNP (last 3 results) No results for input(s): PROBNP in the last 8760 hours. HbA1C: No results for input(s): HGBA1C in the last 72 hours.  CBG: No results for input(s): GLUCAP in the last 168 hours. Lipid Profile: No results for input(s): CHOL, HDL, LDLCALC, TRIG, CHOLHDL, LDLDIRECT in the last 72 hours.  Thyroid Function Tests: No results for input(s): TSH, T4TOTAL, FREET4, T3FREE, THYROIDAB in the last 72 hours. Anemia Panel: No results for input(s): VITAMINB12, FOLATE, FERRITIN, TIBC, IRON, RETICCTPCT in the last 72 hours. Sepsis Labs: No results for input(s): PROCALCITON, LATICACIDVEN in the last 168 hours.  Recent Results (from the past 240 hour(s))  Resp Panel by RT-PCR (Flu A&B, Covid) Nasopharyngeal Swab     Status: None   Collection Time: 12/25/20  8:40 PM   Specimen: Nasopharyngeal Swab; Nasopharyngeal(NP) swabs in vial transport medium  Result Value Ref Range Status   SARS Coronavirus 2 by RT PCR NEGATIVE NEGATIVE Final    Comment: (NOTE) SARS-CoV-2 target nucleic acids are NOT DETECTED.  The SARS-CoV-2 RNA is generally detectable in upper respiratory specimens during the acute phase of infection. The lowest concentration  of SARS-CoV-2 viral copies this assay can detect is 138 copies/mL. A negative result does not preclude SARS-Cov-2 infection and should not be used as the sole basis for treatment or other patient management decisions. A negative result may occur with  improper specimen collection/handling, submission of specimen other than nasopharyngeal swab, presence of viral mutation(s) within the areas targeted by this assay, and inadequate number of viral copies(<138 copies/mL). A negative result must be combined with clinical observations, patient history, and epidemiological information. The expected result is Negative.  Fact Sheet for Patients:  EntrepreneurPulse.com.au  Fact Sheet for Healthcare Providers:  IncredibleEmployment.be  This test is no t yet approved or cleared by the [place] FDA and  has been authorized for detection and/or diagnosis of SARS-CoV-2 by FDA under an Emergency Use Authorization (EUA). This EUA will remain  in effect (meaning this test can be used) for the duration of the COVID-19 declaration under Section 564(b)(1) of the Act, 21 U.S.C.section 360bbb-3(b)(1), unless the authorization is terminated  or revoked sooner.  Influenza A by PCR NEGATIVE NEGATIVE Final   Influenza B by PCR NEGATIVE NEGATIVE Final    Comment: (NOTE) The Xpert Xpress SARS-CoV-2/FLU/RSV plus assay is intended as an aid in the diagnosis of influenza from Nasopharyngeal swab specimens and should not be used as a sole basis for treatment. Nasal washings and aspirates are unacceptable for Xpert Xpress SARS-CoV-2/FLU/RSV testing.  Fact Sheet for Patients: EntrepreneurPulse.com.au  Fact Sheet for Healthcare Providers: IncredibleEmployment.be  This test is not yet approved or cleared by the [place] FDA and has been authorized for detection and/or diagnosis of SARS-CoV-2 by FDA under an Emergency Use  Authorization (EUA). This EUA will remain in effect (meaning this test can be used) for the duration of the COVID-19 declaration under Section 564(b)(1) of the Act, 21 U.S.C. section 360bbb-3(b)(1), unless the authorization is terminated or revoked.  Performed at [facility], [address], [place], [place] [address]          Radiology Studies: No results found.      Scheduled Meds:  amLODipine  2.5 mg Oral Daily   aspirin  325 mg Oral Daily   clopidogrel  75 mg Oral Daily   losartan  100 mg Oral Daily   pantoprazole  40 mg Oral Daily   rosuvastatin  20 mg Oral Daily   Or   rosuvastatin  20 mg Per Tube Daily   senna-docusate  1 tablet Oral BID   Continuous Infusions:   LOS: 5 days    Time spent: 25 minutes    Barb Merino, MD [facility] Pager [phone]

## 2021-01-01 NOTE — Progress Notes (Addendum)
Inpatient Rehabilitation Admissions Coordinator   I have faxed today's PT and OT notes to [facility].  Danne Baxter, RN, MSN Rehab Admissions Coordinator [phone] 01/01/2021 10:03 AM

## 2021-01-01 NOTE — Progress Notes (Signed)
Occupational Therapy Treatment Patient Details Name: JERRA HUCKEBY MRN: [id number] DOB: 12-11-1950 Today's Date: 01/01/2021   History of present illness Moria L Brassfield is a 70 y.o. female with medical history significant for hypertension, hyperlipidemia who presents to the emergency department via EMS due to altered mental status.  Patient was unable to provide history, history was obtained from ED physician and ED medical record as well as from son and daughter at bedside.  Per report, patient was last seen normal on 11/21 around 2 PM, son states that patient was seen on the floor, it was unknown how long she has been on the floor.  She was not responding, EMS was activated and patient was taken to the ED for further evaluation and management.   OT comments  Pt continues to present with difficulty following commands and only verbalizing yes or no and seemingly not always as the pt means to communicate. Pt demonstrates improved R UE A/ROM with nearly 90 degrees of shoulder flexion noted today. Still no active movement noted in elbow, wrist, or digits. Pt required max A for donning socks partially due to difficulty following the command as noted by pt attempting to don sock on her hand. Pt demonstrates need for min G assist for transfers without AD with continued R side neglect. Pt will benefit from continued OT in the hospital and recommended venue below to increase strength, balance, and endurance for safe ADL's.      Recommendations for follow up therapy are one component of a multi-disciplinary discharge planning process, led by the attending physician.  Recommendations may be updated based on patient status, additional functional criteria and insurance authorization.    Follow Up Recommendations  Acute inpatient rehab (3hours/day)    Assistance Recommended at Discharge Frequent or constant Supervision/Assistance  Equipment Recommendations  None recommended by OT    Recommendations for  Other Services Rehab consult    Precautions / Restrictions Precautions Precautions: Fall Restrictions Weight Bearing Restrictions: No       Mobility Bed Mobility Overal bed mobility: Needs Assistance Bed Mobility: Supine to Sit;Sit to Supine     Supine to sit: Supervision Sit to supine: Min guard   General bed mobility comments: requires repeated verbal cueing for safety mostly due to right sided neglict and inability to use RUE for functional task    Transfers Overall transfer level: Needs assistance Equipment used: None Transfers: Sit to/from Stand;Bed to chair/wheelchair/BSC Sit to Stand: Min guard   Step pivot transfers: Min guard       General transfer comment: requires repeated verbal cueing for safety     Balance Overall balance assessment: Needs assistance Sitting-balance support: Feet supported;No upper extremity supported Sitting balance-Leahy Scale: Good Sitting balance - Comments: Good seated EOB   Standing balance support: During functional activity;No upper extremity supported Standing balance-Leahy Scale: Fair Standing balance comment: fair/good static, fair/poor dynamic due to right sided neglict with bumping into nearby objects, walls                           ADL either performed or assessed with clinical judgement   ADL Overall ADL's : Needs assistance/impaired                     Lower Body Dressing: Maximal assistance;Sitting/lateral leans Lower Body Dressing Details (indicate cue type and reason): Pt requried max A to don socks seated in chair. Pt confused and had trouble following commands. Pt  placed sock on her hand rather than on her feet.             Functional mobility during ADLs: Min guard      Extremity/Trunk Assessment Upper Extremity Assessment Upper Extremity Assessment: RUE deficits/detail RUE Deficits / Details: Pt noted to have improved UE active movement at 2+/5 MMT for shoulder flexion and 2-/5  for shoulder abduction. No active wrist or digit motion noted.   Lower Extremity Assessment Lower Extremity Assessment: Defer to PT evaluation        Vision   Vision Assessment?: Yes Tracking/Visual Pursuits: Able to track stimulus in all quads without difficulty Convergence: Impaired (comment) (Good L eye converge. No noted R eye converge.)   Perception Perception Perception: Impaired (Continued R side neglect noted by pt running into objects on R side.)   Praxis Praxis Praxis: Not tested    Cognition Arousal/Alertness: Awake/alert Behavior During Therapy: WFL for tasks assessed/performed Overall Cognitive Status: Impaired/Different from baseline Area of Impairment: Following commands                       Following Commands: Follows multi-step commands inconsistently;Follows one step commands with increased time       General Comments: Patient requires occasional repeated verbal/tactile cueing to complete functional tasks and exercies          Exercises Exercises: General Upper Extremity;Shoulder General Exercises - Upper Extremity Shoulder Flexion: AAROM;Right;5 reps;Seated Shoulder ABduction: AAROM;5 reps;Seated;Right General Exercises - Lower Extremity Ankle Circles/Pumps: Seated;AROM;Strengthening;Both;10 reps;AAROM Long Arc Quad: Seated;AROM;Strengthening;Both;10 reps Hip Flexion/Marching: Seated;AROM;Strengthening;Both;10 reps;AAROM Shoulder Exercises Shoulder External Rotation: 5 reps;PROM;Seated;Right                Pertinent Vitals/ Pain       Pain Assessment: No/denies pain                                                          Frequency  Min 2X/week        Progress Toward Goals  OT Goals(current goals can now be found in the care plan section)  Progress towards OT goals: Progressing toward goals  Acute Rehab OT Goals OT Goal Formulation: Patient unable to participate in goal setting Time For Goal  Achievement: 01/09/21 Potential to Achieve Goals: Good ADL Goals Pt Will Perform Eating: with modified independence;sitting Pt Will Perform Grooming: with modified independence;standing Pt Will Perform Upper Body Bathing: with min assist;sitting Pt Will Perform Lower Body Bathing: with min assist;sit to/from stand;sitting/lateral leans Pt Will Perform Upper Body Dressing: with min assist;sitting Pt Will Perform Lower Body Dressing: with min assist;sit to/from stand;sitting/lateral leans Pt Will Transfer to Toilet: with supervision;regular height toilet;ambulating Pt Will Perform Toileting - Clothing Manipulation and hygiene: with min assist;sit to/from stand;sitting/lateral leans Additional ADL Goal #1: Pt will demonstrate/verbalize understanding of RUE positioning and weightbearing techniques for the RUE while completing ADL tasks and/or seated in recliner.  Plan Discharge plan remains appropriate    Co-evaluation    PT/OT/SLP Co-Evaluation/Treatment: Yes Reason for Co-Treatment: Complexity of the patient's impairments (multi-system involvement) PT goals addressed during session: Mobility/safety with mobility;Balance;Proper use of DME;Strengthening/ROM OT goals addressed during session: ADL's and self-care;Strengthening/ROM  End of Session Equipment Utilized During Treatment: Gait belt  OT Visit Diagnosis: Muscle weakness (generalized) (M62.81)   Activity Tolerance Patient tolerated treatment well   Patient Left in chair;with call bell/phone within reach;with chair alarm set;with family/visitor present   Nurse Communication          Time: 5436-0677 OT Time Calculation (min): 23 min  Charges: OT General Charges $OT Visit: 1 Visit OT Treatments $Therapeutic Exercise: 8-22 mins  Kishia Shackett OT, MOT  Larey Seat 01/01/2021, 9:45 AM

## 2021-01-01 NOTE — Progress Notes (Signed)
Inpatient Rehabilitation Admissions Coordinator   I have received insurance approval for CIR. I have notified daughter, acute team and TOC. I will let everyone know tomorrow how soon I will have a bed available to admit.   Danne Baxter, RN, MSN Rehab Admissions Coordinator [phone] 01/01/2021 3:06 PM

## 2021-01-01 NOTE — Plan of Care (Signed)
  Problem: Education: Goal: Knowledge of General Education information will improve Description: Including pain rating scale, medication(s)/side effects and non-pharmacologic comfort measures Outcome: Not Progressing   Problem: Health Behavior/Discharge Planning: Goal: Ability to manage health-related needs will improve Outcome: Not Progressing   Problem: Clinical Measurements: Goal: Ability to maintain clinical measurements within normal limits will improve Outcome: Progressing Goal: Will remain free from infection Outcome: Progressing Goal: Diagnostic test results will improve Outcome: Progressing Goal: Respiratory complications will improve Outcome: Progressing Goal: Cardiovascular complication will be avoided Outcome: Progressing   Problem: Activity: Goal: Risk for activity intolerance will decrease Outcome: Progressing   Problem: Nutrition: Goal: Adequate nutrition will be maintained Outcome: Progressing   Problem: Coping: Goal: Level of anxiety will decrease Outcome: Progressing   Problem: Elimination: Goal: Will not experience complications related to bowel motility Outcome: Progressing Goal: Will not experience complications related to urinary retention Outcome: Progressing   Problem: Pain Managment: Goal: General experience of comfort will improve Outcome: Progressing   Problem: Safety: Goal: Ability to remain free from injury will improve Outcome: Progressing   Problem: Skin Integrity: Goal: Risk for impaired skin integrity will decrease Outcome: Progressing   Problem: Education: Goal: Knowledge of disease or condition will improve Outcome: Not Progressing Goal: Knowledge of secondary prevention will improve (SELECT ALL) Outcome: Not Progressing Goal: Knowledge of patient specific risk factors will improve (INDIVIDUALIZE FOR PATIENT) Outcome: Not Progressing   Problem: Coping: Goal: Will verbalize positive feelings about self Outcome: Not  Progressing   Problem: Self-Care: Goal: Ability to participate in self-care as condition permits will improve Outcome: Progressing

## 2021-01-01 NOTE — Progress Notes (Signed)
Physical Therapy Treatment Patient Details Name: Natasha Vazquez MRN: [id number] DOB: [birth year] Today's Date: 01/01/2021   History of Present Illness Natasha Vazquez is a 70 y.o. female with medical history significant for hypertension, hyperlipidemia who presents to the emergency department via EMS due to altered mental status.  Patient was unable to provide history, history was obtained from ED physician and ED medical record as well as from son and daughter at bedside.  Per report, patient was last seen normal on 11/21 around 2 PM, son states that patient was seen on the floor, it was unknown how long she has been on the floor.  She was not responding, EMS was activated and patient was taken to the ED for further evaluation and management.    PT Comments    Patient presents seated in chair and agreeable for therapy - her daughter in room.  Patient demonstrates fair/good return for completing transfers, but requires verbal/tactile cueing for safety when having to move to the right side ignoring RUE due to neglect, ambulated in room and hallway without AD with occasional dragging of right foot due to decreased ankle dorsiflexion, frequent stumbling when making turns to the right requiring verbal/tactile cue to avoid loss of balance and distance limited mostly due to safety concerns and right sided neglect.  Patient unable to complete RLE exercises without manual assistance due to neglect, can dorsiflex right foot to full range of motion, but does not when walking.  Patient tolerated sitting up in chair after therapy - nursing staff aware.  Patient will benefit from continued skilled physical therapy in hospital and recommended venue below to increase strength, balance, endurance for safe ADLs and gait.      Recommendations for follow up therapy are one component of a multi-disciplinary discharge planning process, led by the attending physician.  Recommendations may be updated based on patient status,  additional functional criteria and insurance authorization.  Follow Up Recommendations  Acute inpatient rehab (3hours/day)     Assistance Recommended at Discharge Frequent or constant Supervision/Assistance  Equipment Recommendations  None recommended by PT;Other (comment) (to be determined)    Recommendations for Other Services       Precautions / Restrictions Precautions Precautions: Fall Restrictions Weight Bearing Restrictions: No     Mobility  Bed Mobility Overal bed mobility: Needs Assistance Bed Mobility: Supine to Sit;Sit to Supine     Supine to sit: Supervision Sit to supine: Min guard   General bed mobility comments: requires repeated verbal cueing for safety mostly due to right sided neglict and inability to use RUE for functional task    Transfers Overall transfer level: Needs assistance Equipment used: None Transfers: Sit to/from Stand;Bed to chair/wheelchair/BSC Sit to Stand: Min guard     Step pivot transfers: Min guard     General transfer comment: requires repeated verbal cueing for safety    Ambulation/Gait Ambulation/Gait assistance: Min guard Gait Distance (Feet): 75 Feet Assistive device: None;1 person hand held assist Gait Pattern/deviations: Decreased step length - right;Decreased stance time - right;Decreased dorsiflexion - right;Steppage Gait velocity: Decreased     General Gait Details: slow labored cadence with decreased right dorsilfexion, occasional dragging of right foot, most diffiuclty making turns to the right requiring verbal cues and tactile assistance for safety   Stairs             Wheelchair Mobility    Modified Rankin (Stroke Patients Only)       Balance Overall balance assessment: Needs assistance Sitting-balance support:  Feet supported;No upper extremity supported Sitting balance-Leahy Scale: Good Sitting balance - Comments: Good seated EOB   Standing balance support: During functional activity;No  upper extremity supported Standing balance-Leahy Scale: Fair Standing balance comment: fair/good static, fair/poor dynamic due to right sided neglict with bumping into nearby objects, walls                            Cognition Arousal/Alertness: Awake/alert Behavior During Therapy: WFL for tasks assessed/performed Overall Cognitive Status: Impaired/Different from baseline Area of Impairment: Following commands                       Following Commands: Follows multi-step commands inconsistently;Follows one step commands with increased time       General Comments: Patient requires occasional repeated verbal/tactile cueing to complete functional tasks and exercies        Exercises General Exercises - Lower Extremity Ankle Circles/Pumps: Seated;AROM;Strengthening;Both;10 reps;AAROM Long Arc Quad: Seated;AROM;Strengthening;Both;10 reps Hip Flexion/Marching: Seated;AROM;Strengthening;Both;10 reps;AAROM    General Comments        Pertinent Vitals/Pain Pain Assessment: No/denies pain    Home Living                          Prior Function            PT Goals (current goals can now be found in the care plan section) Acute Rehab PT Goals Patient Stated Goal: Return home after Rehab. PT Goal Formulation: With patient/family Time For Goal Achievement: 01/09/21 Potential to Achieve Goals: Good Progress towards PT goals: Progressing toward goals    Frequency    Min 4X/week      PT Plan Current plan remains appropriate    Co-evaluation PT/OT/SLP Co-Evaluation/Treatment: Yes Reason for Co-Treatment: Complexity of the patient's impairments (multi-system involvement);To address functional/ADL transfers PT goals addressed during session: Mobility/safety with mobility;Balance;Proper use of DME;Strengthening/ROM        AM-PAC PT "6 Clicks" Mobility   Outcome Measure  Help needed turning from your back to your side while in a flat bed  without using bedrails?: None Help needed moving from lying on your back to sitting on the side of a flat bed without using bedrails?: A Little Help needed moving to and from a bed to a chair (including a wheelchair)?: A Little Help needed standing up from a chair using your arms (e.g., wheelchair or bedside chair)?: A Little Help needed to walk in hospital room?: A Little Help needed climbing 3-5 steps with a railing? : A Lot 6 Click Score: 18    End of Session Equipment Utilized During Treatment: Gait belt Activity Tolerance: Patient tolerated treatment well;Patient limited by fatigue Patient left: in chair;with call bell/phone within reach Nurse Communication: Mobility status PT Visit Diagnosis: Unsteadiness on feet (R26.81);Other abnormalities of gait and mobility (R26.89);Muscle weakness (generalized) (M62.81)     Time: 7616-0737 PT Time Calculation (min) (ACUTE ONLY): 24 min  Charges:  $Gait Training: 8-22 mins $Therapeutic Exercise: 8-22 mins                     9:13 AM, 01/01/21 Lonell Grandchild, MPT Physical Therapist with [facility] [phone] office [phone] mobile phone

## 2021-01-01 NOTE — Progress Notes (Addendum)
Inpatient Rehabilitation Admissions Coordinator   Per Bernadene Bell MD peer to peer request with Dr Sloan Leiter yesterday, I am sending updated therapy notes from Monday as well as MD progress notes. If additional information is available this morning, I will also provide those clinicals.  Danne Baxter, RN, MSN Rehab Admissions Coordinator [phone] 01/01/2021 8:35 AM

## 2021-01-01 NOTE — Progress Notes (Signed)
PROGRESS NOTE    Natasha Vazquez  LFY:[id number] DOB: [birth year] DOA: 12/25/2020 PCP: Carol Ada, MD    Brief Narrative:  Patient with history of hypertension hyperlipidemia who was found by her children in the evening at home unresponsive, unable to express and covered with feces.  EMS activated and brought to the ER.  Patient was found to have dense right upper extremity hemiplegia and evidence of left-sided stroke.   Assessment & Plan:   Principal Problem:   Acute ischemic stroke [facility]) Active Problems:   Essential hypertension   Mixed hyperlipidemia   Acute ischemic left MCA stroke (HCC)  Acute ischemic stroke, left sided multiple territory stroke: Clinical findings, unresponsiveness but likely unable to express, dense right upper extremity hemiplegia and right-sided facial droop. CT head findings, hypodense area left cerebrum. MRI of the brain, extensive left ACA and MCA territory infarct.  No mass-effect. CTA of the head and neck, no large vessel occlusion but multiple intracranial stenosis. 2D echocardiogram, essentially normal.  Normal ejection fraction.  No thrombosis. Antiplatelet therapy, none at home. LDL 77 Hemoglobin A1c, 5.6  -Currently medically stabilizing.  On dysphagia 2 diet with nectar thick liquids.  -Antiplatelet therapy, none at home.  Neurology recommended aspirin 325 mg daily and Plavix 75 mg daily for 3 months and then continue aspirin.  History of GI bleed 20 years ago associated with aspirin.  Patient was taking Mobic and intermittent ibuprofen with no recurrence symptoms.  Recent negative colonoscopy.  Benefits outweigh risk.  started on antiplatelet therapy as above along with Protonix for gastric protection. -Antilipid therapy.  LDL 77.  On Crestor 10 mg at home.  Increased to 20 mg. -Seen by PT OT.  Recommended inpatient physical therapy.  Refer to acute inpatient rehab. -Recommended 30-day event monitoring for A. fib, no arrhythmias on telemetry  monitoring for last 5 days. -She was given event monitoring by cardiology office.  They will follow-up.  Essential hypertension: Blood pressures are at goal.  Home medications were resumed.  Patient is medically stable. Peer to peer done with insurance physician on 11/28.  Updated therapy notes sent today. She is best candidate for rehab and chances of improvement back to her functional status with acute inpatient rehab.  DVT prophylaxis: SCD's Start: 12/26/20 8527   Code Status: Full code Family Communication: Sister at the bedside. Disposition Plan: Status is: Inpatient  Remains inpatient appropriate because: Needing acute inpatient rehab.  Medically stable.    Consultants:  Neurology  Procedures:  None  Antimicrobials:  None   Subjective: Patient seen and examined.  No overnight events.  I discussed with family about our attempts to get her to best care and acute inpatient rehab.  Objective: Vitals:   12/30/20 2124 12/31/20 0551 12/31/20 1253 01/01/21 0531  BP: (!) 139/54 (!) 132/53 130/68 115/74  Pulse: 79 80 86 79  Resp: 15 16 18 15   Temp: 98 F (36.7 C) 97.7 F (36.5 C) 97.8 F (36.6 C) 97.8 F (36.6 C)  TempSrc: Oral Oral Oral   SpO2: 98% 94% 98% 94%  Weight:      Height:        Intake/Output Summary (Last 24 hours) at 01/01/2021 1304 Last data filed at 01/01/2021 1000 Gross per 24 hour  Intake 480 ml  Output --  Net 480 ml   Filed Weights   12/28/20 1342  Weight: 76.8 kg    Examination:  General exam: Appears calm and comfortable , interactive but severely dysarthric. Patient says no  to everything which is basically due to her dysarthria. Respiratory system: Clear to auscultation. Respiratory effort normal. Cardiovascular system: S1 & S2 heard, RRR. No JVD, murmurs, rubs, gallops or clicks. No pedal edema. Gastrointestinal system: Abdomen is nondistended, soft and nontender. No organomegaly or masses felt. Normal bowel sounds heard. Central  nervous system: Alert and oriented.  Expressive aphasia.  Right facial droop.  Right upper extremity flaccid and right-sided hemineglect.    Data Reviewed: I have personally reviewed following labs and imaging studies  CBC: Recent Labs  Lab 12/25/20 2035 12/25/20 2058  WBC 8.0  --   NEUTROABS 5.6  --   HGB 12.9 13.6  HCT 38.0 40.0  MCV 96.7  --   PLT 333  --    Basic Metabolic Panel: Recent Labs  Lab 12/25/20 2035 12/25/20 2058  NA 135 140  K 4.1 4.0  CL 103 105  CO2 21*  --   GLUCOSE 147* 143*  BUN 19 18  CREATININE 0.75 0.70  CALCIUM 9.6  --    GFR: Estimated Creatinine Clearance: 70.4 mL/min (by C-G formula based on SCr of 0.7 mg/dL). Liver Function Tests: Recent Labs  Lab 12/25/20 2035  AST 48*  ALT 37  ALKPHOS 112  BILITOT 0.7  PROT 8.0  ALBUMIN 4.4   No results for input(s): LIPASE, AMYLASE in the last 168 hours. No results for input(s): AMMONIA in the last 168 hours. Coagulation Profile: Recent Labs  Lab 12/25/20 2035  INR 1.0   Cardiac Enzymes: No results for input(s): CKTOTAL, CKMB, CKMBINDEX, TROPONINI in the last 168 hours. BNP (last 3 results) No results for input(s): PROBNP in the last 8760 hours. HbA1C: No results for input(s): HGBA1C in the last 72 hours.  CBG: No results for input(s): GLUCAP in the last 168 hours. Lipid Profile: No results for input(s): CHOL, HDL, LDLCALC, TRIG, CHOLHDL, LDLDIRECT in the last 72 hours.  Thyroid Function Tests: No results for input(s): TSH, T4TOTAL, FREET4, T3FREE, THYROIDAB in the last 72 hours. Anemia Panel: No results for input(s): VITAMINB12, FOLATE, FERRITIN, TIBC, IRON, RETICCTPCT in the last 72 hours. Sepsis Labs: No results for input(s): PROCALCITON, LATICACIDVEN in the last 168 hours.  Recent Results (from the past 240 hour(s))  Resp Panel by RT-PCR (Flu A&B, Covid) Nasopharyngeal Swab     Status: None   Collection Time: 12/25/20  8:40 PM   Specimen: Nasopharyngeal Swab;  Nasopharyngeal(NP) swabs in vial transport medium  Result Value Ref Range Status   SARS Coronavirus 2 by RT PCR NEGATIVE NEGATIVE Final    Comment: (NOTE) SARS-CoV-2 target nucleic acids are NOT DETECTED.  The SARS-CoV-2 RNA is generally detectable in upper respiratory specimens during the acute phase of infection. The lowest concentration of SARS-CoV-2 viral copies this assay can detect is 138 copies/mL. A negative result does not preclude SARS-Cov-2 infection and should not be used as the sole basis for treatment or other patient management decisions. A negative result may occur with  improper specimen collection/handling, submission of specimen other than nasopharyngeal swab, presence of viral mutation(s) within the areas targeted by this assay, and inadequate number of viral copies(<138 copies/mL). A negative result must be combined with clinical observations, patient history, and epidemiological information. The expected result is Negative.  Fact Sheet for Patients:  EntrepreneurPulse.com.au  Fact Sheet for Healthcare Providers:  IncredibleEmployment.be  This test is no t yet approved or cleared by the [place] FDA and  has been authorized for detection and/or diagnosis of SARS-CoV-2 by FDA  under an Emergency Use Authorization (EUA). This EUA will remain  in effect (meaning this test can be used) for the duration of the COVID-19 declaration under Section 564(b)(1) of the Act, 21 U.S.C.section 360bbb-3(b)(1), unless the authorization is terminated  or revoked sooner.       Influenza A by PCR NEGATIVE NEGATIVE Final   Influenza B by PCR NEGATIVE NEGATIVE Final    Comment: (NOTE) The Xpert Xpress SARS-CoV-2/FLU/RSV plus assay is intended as an aid in the diagnosis of influenza from Nasopharyngeal swab specimens and should not be used as a sole basis for treatment. Nasal washings and aspirates are unacceptable for Xpert Xpress  SARS-CoV-2/FLU/RSV testing.  Fact Sheet for Patients: EntrepreneurPulse.com.au  Fact Sheet for Healthcare Providers: IncredibleEmployment.be  This test is not yet approved or cleared by the [place] FDA and has been authorized for detection and/or diagnosis of SARS-CoV-2 by FDA under an Emergency Use Authorization (EUA). This EUA will remain in effect (meaning this test can be used) for the duration of the COVID-19 declaration under Section 564(b)(1) of the Act, 21 U.S.C. section 360bbb-3(b)(1), unless the authorization is terminated or revoked.  Performed at [facility], [address] [place], [place] [address]          Radiology Studies: No results found.      Scheduled Meds:  amLODipine  2.5 mg Oral Daily   aspirin  325 mg Oral Daily   clopidogrel  75 mg Oral Daily   losartan  100 mg Oral Daily   pantoprazole  40 mg Oral Daily   rosuvastatin  20 mg Oral Daily   Or   rosuvastatin  20 mg Per Tube Daily   senna-docusate  1 tablet Oral BID   Continuous Infusions:   LOS: 6 days    Time spent: 25 minutes    Barb Merino, MD [facility] Pager [phone]

## 2021-01-02 ENCOUNTER — Inpatient Hospital Stay (HOSPITAL_COMMUNITY)
Admission: RE | Admit: 2021-01-02 | Discharge: 2021-01-15 | DRG: 057 | Disposition: A | Discharge status: Home / Self Care | Payer: Medicare Other | Source: Other Acute Inpatient Hospital | Attending: Physical Medicine & Rehabilitation | Admitting: Physical Medicine & Rehabilitation

## 2021-01-02 ENCOUNTER — Other Ambulatory Visit: Payer: Self-pay

## 2021-01-02 ENCOUNTER — Encounter (HOSPITAL_COMMUNITY): Payer: Self-pay | Admitting: Physical Medicine & Rehabilitation

## 2021-01-02 DIAGNOSIS — K5909 Other constipation: Secondary | ICD-10-CM | POA: Diagnosis not present

## 2021-01-02 DIAGNOSIS — I69351 Hemiplegia and hemiparesis following cerebral infarction affecting right dominant side: Secondary | ICD-10-CM | POA: Diagnosis not present

## 2021-01-02 DIAGNOSIS — K5901 Slow transit constipation: Secondary | ICD-10-CM | POA: Diagnosis not present

## 2021-01-02 DIAGNOSIS — I69392 Facial weakness following cerebral infarction: Secondary | ICD-10-CM | POA: Diagnosis not present

## 2021-01-02 DIAGNOSIS — I63512 Cerebral infarction due to unspecified occlusion or stenosis of left middle cerebral artery: Secondary | ICD-10-CM

## 2021-01-02 DIAGNOSIS — Z90710 Acquired absence of both cervix and uterus: Secondary | ICD-10-CM

## 2021-01-02 DIAGNOSIS — E785 Hyperlipidemia, unspecified: Secondary | ICD-10-CM | POA: Diagnosis not present

## 2021-01-02 DIAGNOSIS — Z79899 Other long term (current) drug therapy: Secondary | ICD-10-CM

## 2021-01-02 DIAGNOSIS — I10 Essential (primary) hypertension: Secondary | ICD-10-CM | POA: Diagnosis present

## 2021-01-02 DIAGNOSIS — I5032 Chronic diastolic (congestive) heart failure: Secondary | ICD-10-CM | POA: Diagnosis present

## 2021-01-02 DIAGNOSIS — I69320 Aphasia following cerebral infarction: Secondary | ICD-10-CM | POA: Diagnosis not present

## 2021-01-02 DIAGNOSIS — R531 Weakness: Secondary | ICD-10-CM | POA: Diagnosis present

## 2021-01-02 MED ORDER — CLOPIDOGREL BISULFATE 75 MG PO TABS: 75.0000 mg | ORAL_TABLET | Freq: Every day | ORAL | 11 refills | Status: DC

## 2021-01-02 MED ORDER — ROSUVASTATIN CALCIUM 20 MG PO TABS: 20.0000 mg | ORAL_TABLET | Freq: Every day | ORAL | 11 refills | Status: DC

## 2021-01-02 MED ORDER — CLOPIDOGREL BISULFATE 75 MG PO TABS
75.0000 mg | ORAL_TABLET | Freq: Every day | ORAL | Status: DC
  Administered 2021-01-03 – 2021-01-15 (×13): 75 mg via ORAL
  Filled 2021-01-02 (×13): qty 1

## 2021-01-02 MED ORDER — ACETAMINOPHEN 325 MG PO TABS: 650.0000 mg | ORAL_TABLET | ORAL | Status: DC | PRN

## 2021-01-02 MED ORDER — ACETAMINOPHEN 160 MG/5ML PO SOLN: 650.0000 mg | ORAL | Status: DC | PRN

## 2021-01-02 MED ORDER — PANTOPRAZOLE SODIUM 40 MG PO TBEC: 40.0000 mg | DELAYED_RELEASE_TABLET | Freq: Every day | ORAL | 2 refills | Status: DC

## 2021-01-02 MED ORDER — AMLODIPINE BESYLATE 2.5 MG PO TABS
2.5000 mg | ORAL_TABLET | Freq: Every day | ORAL | Status: DC
  Administered 2021-01-03 – 2021-01-15 (×13): 2.5 mg via ORAL
  Filled 2021-01-02 (×13): qty 1

## 2021-01-02 MED ORDER — PANTOPRAZOLE SODIUM 40 MG PO TBEC
40.0000 mg | DELAYED_RELEASE_TABLET | Freq: Every day | ORAL | Status: DC
  Administered 2021-01-03 – 2021-01-15 (×13): 40 mg via ORAL
  Filled 2021-01-02 (×13): qty 1

## 2021-01-02 MED ORDER — ASPIRIN 325 MG PO TABS
325.0000 mg | ORAL_TABLET | Freq: Every day | ORAL | Status: DC
  Administered 2021-01-03 – 2021-01-15 (×13): 325 mg via ORAL
  Filled 2021-01-02 (×13): qty 1

## 2021-01-02 MED ORDER — BLOOD PRESSURE CONTROL BOOK
Freq: Once | Status: AC
  Filled 2021-01-02: qty 1

## 2021-01-02 MED ORDER — ROSUVASTATIN CALCIUM 20 MG PO TABS
20.0000 mg | ORAL_TABLET | Freq: Every day | ORAL | Status: DC
  Filled 2021-01-02 (×9): qty 1

## 2021-01-02 MED ORDER — SENNOSIDES-DOCUSATE SODIUM 8.6-50 MG PO TABS
1.0000 | ORAL_TABLET | Freq: Two times a day (BID) | ORAL | Status: DC
  Administered 2021-01-02 – 2021-01-15 (×23): 1 via ORAL
  Filled 2021-01-02 (×26): qty 1

## 2021-01-02 MED ORDER — ASPIRIN EC 325 MG PO TBEC: 325.0000 mg | DELAYED_RELEASE_TABLET | Freq: Every day | ORAL | 0 refills | Status: DC

## 2021-01-02 MED ORDER — ACETAMINOPHEN 650 MG RE SUPP: 650.0000 mg | RECTAL | Status: DC | PRN

## 2021-01-02 MED ORDER — ROSUVASTATIN CALCIUM 20 MG PO TABS
20.0000 mg | ORAL_TABLET | Freq: Every day | ORAL | Status: DC
  Administered 2021-01-03 – 2021-01-15 (×13): 20 mg via ORAL
  Filled 2021-01-02 (×13): qty 1

## 2021-01-02 MED ORDER — LOSARTAN POTASSIUM 50 MG PO TABS: 100.0000 mg | ORAL_TABLET | Freq: Every day | ORAL | Status: DC

## 2021-01-02 MED ORDER — ASPIRIN EC 81 MG PO TBEC: 81.0000 mg | DELAYED_RELEASE_TABLET | Freq: Every day | ORAL | 0 refills | Status: DC

## 2021-01-02 NOTE — Progress Notes (Signed)
Patient ID: Natasha Vazquez, female   DOB: August 01, 1950, 70 y.o.   MRN: [id number] Met with the patient to introduce self and role. Reviewed rehab routine, therapy schedule and plan of care. Discussed secondary risks including HTN and HLD, dietary modifications and medications. Patient acknowledged understanding of information reviewed. Exp aphasia makes it difficult for patient to respond but able to answer with one word and nods. Continue to follow along to discharge to address educational needs and collaborate with the team to facilitate preparation for discharge home w daughter, sister and brother. Margarito Liner

## 2021-01-02 NOTE — H&P (Incomplete)
Physical Medicine and Rehabilitation Admission H&P    Chief Complaint  Patient presents with   Altered Mental Status  : HPI: Natasha Vazquez is a 70 year old right-handed female with history of hypertension, GI bleed 20 years ago as well as hyperlipidemia.  Per chart review patient lives alone.  Independent household and community ambulator.  She does not drive.  1 level home 4 steps to entry.  Her brother lives next door.  Presented to [facility] 12/25/2020 with right side weakness, aphasia and altered mental status.  Blood pressure was elevated 178/73.  Admission chemistries unremarkable except glucose 147, alcohol negative.  CT angiogram head and neck hypodensity in the posterior left frontal lobe concerning for acute/subacute infarct.  No intracranial large vessel occlusion.  Multifocal narrowing of the right A1 and bilateral distal MCA branches.  No hemodynamically significant stenosis in the neck.  MRI of the brain extensive acute infarcts throughout the left cerebrum, including left frontal, parietal and temporal lobes and left basal ganglia.  Mild petechial hemorrhage without mass occupying hemorrhagic transformation.  Patient did not receive tPA.  Echocardiogram with ejection fraction of 65 to 70% no regional wall motion abnormalities grade 1 diastolic dysfunction.  Neurology follow-up currently maintained on aspirin 325 mg daily and Plavix 75 mg daily for CVA prophylaxis for 3 months then aspirin alone.  She is currently on a mechanical soft diet with thin liquids.  Therapy evaluations completed due to patient's right side weakness and dysarthric speech was admitted for a comprehensive rehab program.  Review of Systems  Constitutional:  Negative for chills and fever.  HENT:  Negative for hearing loss.   Eyes:  Negative for blurred vision and double vision.  Respiratory:  Negative for cough and shortness of breath.   Cardiovascular:  Negative for chest pain, palpitations and leg swelling.   Gastrointestinal:  Positive for constipation. Negative for heartburn, nausea and vomiting.  Genitourinary:  Negative for dysuria, flank pain and hematuria.  Musculoskeletal:  Positive for joint pain and myalgias.  Skin:  Negative for rash.  Neurological:  Positive for speech change and weakness.  All other systems reviewed and are negative. Past Medical History:  Diagnosis Date   Hypertension    TMJ (dislocation of temporomandibular joint)    Past Surgical History:  Procedure Laterality Date   ABDOMINAL HYSTERECTOMY     CHOLECYSTECTOMY     IR ANGIO INTRA EXTRACRAN SEL INTERNAL CAROTID BILAT MOD SED  09/16/2016   IR ANGIO VERTEBRAL SEL VERTEBRAL BILAT MOD SED  09/16/2016   MASS EXCISION Left 02/10/2020   Procedure: excision of soft tissue mass left upper back;  Surgeon: Jesusita Oka, MD;  Location: [place];  Service: General;  Laterality: Left;   History reviewed. No pertinent family history. Social History:  reports that she has never smoked. She has never used smokeless tobacco. She reports that she does not drink alcohol and does not use drugs. Allergies:  Allergies  Allergen Reactions   Aspirin Other (See Comments)    Had a GI bleed   Lisinopril     Other reaction(s): cough   Penicillins Rash and Other (See Comments)    Has patient had a PCN reaction causing immediate rash, facial/tongue/throat swelling, SOB or lightheadedness with hypotension: Yes Has patient had a PCN reaction causing severe rash involving mucus membranes or skin necrosis: No Has patient had a PCN reaction that required hospitalization: Unknown Has patient had a PCN reaction occurring within the last 10 years: No  If all of the above answers are "NO", then may proceed with Cephalosporin use.    Medications Prior to Admission  Medication Sig Dispense Refill   amLODipine (NORVASC) 2.5 MG tablet Take 2.5 mg by mouth daily.     carboxymethylcellulose (REFRESH PLUS) 0.5 % SOLN Place 1 drop  into both eyes in the morning and at bedtime.     cholecalciferol (VITAMIN D3) 25 MCG (1000 UNIT) tablet Take 1,000 Units by mouth daily.     cycloSPORINE (RESTASIS) 0.05 % ophthalmic emulsion Place 1 drop into both eyes 2 (two) times daily.     losartan (COZAAR) 100 MG tablet Take 100 mg by mouth daily.  1   Meclizine HCl 25 MG CHEW Chew 1 tablet by mouth daily as needed (dizziness).     meloxicam (MOBIC) 15 MG tablet Take 15 mg by mouth daily as needed (hip pain.).     metroNIDAZOLE (METROCREAM) 0.75 % cream Apply 1 application topically daily.     Multiple Vitamins-Minerals (PRESERVISION AREDS 2 PO) Take 1 tablet by mouth in the morning and at bedtime.     Omega-3 Fatty Acids (FISH OIL) 1000 MG CAPS Take 1 capsule by mouth daily.     Propylene Glycol (SYSTANE COMPLETE) 0.6 % SOLN Place 1 tablet into both eyes in the morning and at bedtime.     rosuvastatin (CRESTOR) 5 MG tablet Take 5 mg by mouth every other day. In the morning     ibuprofen (ADVIL) 800 MG tablet Take 1 tablet (800 mg total) by mouth every 8 (eight) hours as needed. Take with food (Patient not taking: Reported on 12/26/2020) 90 tablet 0   oxyCODONE (ROXICODONE) 5 MG immediate release tablet Take 1 tablet (5 mg total) by mouth every 6 (six) hours as needed. Alternate each dose with tylenol/ibuprofen (Patient not taking: Reported on 12/26/2020) 30 tablet 0    Drug Regimen Review Drug regimen was reviewed and remains appropriate with no significant issues identified  Home: Home Living Family/patient expects to be discharged to:: Inpatient rehab Living Arrangements: Alone Available Help at Discharge: Family, Other (Comment), Available PRN/intermittently Type of Home: House Home Access: Stairs to enter [employer] of Steps: 4-5 Entrance Stairs-Rails: Left Home Layout: One level, Able to live on main level with bedroom/bathroom Bathroom Shower/Tub: Chiropodist: Standard Home Equipment: None   Lives With: Alone   Functional History: Prior Function Prior Level of Function : Independent/Modified Independent Mobility Comments: Independent household and community ambulator, does not drive. ADLs Comments: Independent w/ ADL's.  Functional Status:  Mobility: Bed Mobility Overal bed mobility: Needs Assistance Bed Mobility: Supine to Sit, Sit to Supine Supine to sit: Supervision Sit to supine: Min guard General bed mobility comments: requires repeated verbal cueing for safety mostly due to right sided neglict and inability to use RUE for functional task Transfers Overall transfer level: Needs assistance Equipment used: None Transfers: Sit to/from Stand, Bed to chair/wheelchair/BSC Sit to Stand: Min guard Bed to/from chair/wheelchair/BSC transfer type:: Step pivot Step pivot transfers: Min guard General transfer comment: requires repeated verbal cueing for safety Ambulation/Gait Ambulation/Gait assistance: Min guard Gait Distance (Feet): 75 Feet Assistive device: None, 1 person hand held assist Gait Pattern/deviations: Decreased step length - right, Decreased stance time - right, Decreased dorsiflexion - right, Steppage General Gait Details: slow labored cadence with decreased right dorsilfexion, occasional dragging of right foot, most diffiuclty making turns to the right requiring verbal cues and tactile assistance for safety Gait velocity: Decreased  ADL: ADL Overall ADL's : Needs assistance/impaired Eating/Feeding: Set up, Sitting Grooming: Wash/dry face, Wash/dry hands, Oral care, Applying deodorant, Brushing hair, Minimal assistance, Bed level Upper Body Bathing: Moderate assistance, Sitting Lower Body Bathing: Moderate assistance, Sit to/from stand, Sitting/lateral leans Upper Body Dressing : Moderate assistance, Sitting Lower Body Dressing: Maximal assistance, Sitting/lateral leans Lower Body Dressing Details (indicate cue type and reason): Pt requried max A to  don socks seated in chair. Pt confused and had trouble following commands. Pt placed sock on her hand rather than on her feet. Toilet Transfer: Minimal assistance, BSC/3in1 Toilet Transfer Details (indicate cue type and reason): with quad cane. Physical assist provided to advance right foot to take steps due to weakness and hospital socks gripping floor. Toileting- Clothing Manipulation and Hygiene: Total assistance, Sitting/lateral lean, Sit to/from stand Functional mobility during ADLs: Min guard  Cognition: Cognition Overall Cognitive Status: Impaired/Different from baseline Orientation Level: Oriented X4 Awareness:  (reduced awarensess to the right side of her body and face) Problem Solving: Impaired Problem Solving Impairment: Verbal basic Executive Function: Self Monitoring, Self Correcting Self Monitoring: Impaired Behaviors: Perseveration, Impulsive Cognition Arousal/Alertness: Awake/alert Behavior During Therapy: WFL for tasks assessed/performed Overall Cognitive Status: Impaired/Different from baseline Area of Impairment: Following commands Following Commands: Follows multi-step commands inconsistently, Follows one step commands with increased time General Comments: Patient requires occasional repeated verbal/tactile cueing to complete functional tasks and exercies Difficult to assess due to: Impaired communication  Physical Exam: Blood pressure 117/90, pulse 92, temperature (!) 97.5 F (36.4 C), temperature source Oral, resp. rate 17, height 5\' 8"  (1.727 m), weight 76.8 kg, SpO2 99 %. Physical Exam Neurological:     Comments: Patient is alert makes eye contact with examiner.  She does have expressive receptive aphasia but able to use some simple words.  Inconsistent to follow commands.    No results found for this or any previous visit (from the past 48 hour(s)). No results found.     Medical Problem List and Plan: 1.  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  Antithrombotics: -DVT/anticoagulation:  Mechanical: Antiembolism stockings, thigh (TED hose) Bilateral lower extremities  -antiplatelet therapy: Aspirin 325 mg daily and Plavix 75 mg daily x3 months then aspirin alone 3. Pain Management: Tylenol as needed 4. Mood: Provide emotional support  -antipsychotic agents: N/A 5. Neuropsych: This patient is not capable of making decisions on her own behalf. 6. Skin/Wound Care: Routine skin checks 7. Fluids/Electrolytes/Nutrition: Routine in and outs with follow-up chemistries 8.  Hypertension.  Currently on Norvasc 2.5 mg daily.Her Cozaar 100 mg daily was held due to blood pressure being soft and resume as needed.  Monitor with increased mobility 9.  Hyperlipidemia.  Crestor 10.  History of remote GI bleed 20 years ago.  Continue Protonix.     Lavon Paganini Yash Cacciola, PA-C 01/02/2021

## 2021-01-02 NOTE — Progress Notes (Signed)
Inpatient Rehabilitation Admissions Coordinator   I have [facility] CIR bed available to admit her today. I contacted her daughter by phone and she is in agreement. I have alerted acute team and TOC. Dr Ranell Patrick will be admitting Rehab MD. I will verity room number at [facility] CIR and arrange [facility] transport and notify [facility] RN when pickup to be scheduled.  Danne Baxter, RN, MSN Rehab Admissions Coordinator [phone] 01/02/2021 10:02 AM

## 2021-01-02 NOTE — Progress Notes (Signed)
Izora Ribas, MD  Physician CASE MANAGEMENT PMR Pre-admission    Signed Date of Service:  12/28/2020  1:52 PM  Related encounter: ED to Hosp-Admission (Discharged) from 12/25/2020 in [facility]      Show:Clear all '[x]' Written'[x]' Templated'[x]' Copied  Added by: '[x]' Cristina Gong, RN'[x]' Retta Diones, RN'[x]' Raulkar, Clide Deutscher, MD  '[]' Hover for details                                                                                                                                                                                                                                                                                                                                                                                                                                                          PMR Admission Coordinator Pre-Admission Assessment   Patient: Natasha Vazquez is an 70 y.o., female MRN: [id number] DOB: [birth year] Height: '5\' 8"'  (172.7 cm) Weight: 76.8 kg   Insurance Information HMO: Yes HMOPOS    PPO:      PCP:      IPA:      80/20:      OTHER: Group [id number] PRIMARY: U[employer] Medicare      Policy#: [id number]      Subscriber: patient CM Name:     Phone#: [phone] option #7  Fax#: [phone] Pre-Cert#: [id number]  approved for 7 days  Employer: Retired Benefits:  Phone #: [phone]     Name: [email or web address] Eff. Date: 02/04/20     Deduct: $0      Out of Pocket Max: [phone] ($0 met)      Life Max: N/A CIR: $1480 copay/admission      SNF: $0 days 1-20; $194.50 days 21-100 Outpatient: 100%     Co-Pay: none Home Health: 100$      Co-Pay: none DME: 80%     Co-Pay: 20% Providers: in network  SECONDARY: Medicaid of [place]      Policy#: [id number]     Phone#: [phone] [facility] 01/01/21   Financial Counselor:         Phone#:     The "Data Collection Information Summary" for patients in Inpatient Rehabilitation Facilities with attached "Privacy Act [place] Records" was provided and verbally reviewed with: Family   Emergency Contact Information Contact Information       Name Relation Home Work Mobile    Vazquez,Natasha Daughter [phone]           Current Medical History  Patient Admitting Diagnosis: Multiple L CVA   History of Present Illness: Natasha Vazquez is a 70 year old right-handed female with history of hypertension, GI bleed 20 years ago as well as hyperlipidemia.  [facility]ed to [facility]/25/2020 with right side weakness, aphasia and altered mental status.  Blood pressure was elevated 178/73.  Admission chemistries unremarkable except glucose 147, alcohol negative.  CT angiogram head and neck hypodensity in the posterior left frontal lobe concerning for acute/subacute infarct.  No intracranial large vessel occlusion.  Multifocal narrowing of the right A1 and bilateral distal MCA branches.  No hemodynamically significant stenosis in the neck.  MRI of the brain extensive acute infarcts throughout the left cerebrum, including left frontal, parietal and temporal lobes and left basal ganglia.  Mild petechial hemorrhage without mass occupying hemorrhagic transformation.  Patient did not receive tPA.  Echocardiogram with ejection fraction of 65 to 70% no regional wall motion abnormalities grade 1 diastolic dysfunction.  Neurology follow-up currently maintained on aspirin 325 mg daily and Plavix 75 mg daily for CVA prophylaxis for 3 months then aspirin alone.  She is currently on a mechanical soft diet with thin liquids.     Complete NIHSS TOTAL: 11   Patient's medical record from [facility] has been reviewed by the rehabilitation admission coordinator and physician.   Past Medical History      Past Medical History:  Diagnosis Date   Hypertension     TMJ  (dislocation of temporomandibular joint)      Has the patient had major surgery during 100 days prior to admission? No   Family History   family history is not on file.   Current Medications   Current Facility-Administered Medications:    acetaminophen (TYLENOL) tablet 650 mg, 650 mg, Oral, Q4H PRN, 650 mg at 12/30/20 1830 **OR** acetaminophen (TYLENOL) 160 MG/5ML solution 650 mg, 650 mg, Per Tube, Q4H PRN **OR** acetaminophen (TYLENOL) suppository 650 mg, 650 mg, Rectal, Q4H PRN, Adefeso, Oladapo, DO   amLODipine (NORVASC) tablet 2.5 mg, 2.5 mg, Oral, Daily, Ghimire, Kuber, MD, 2.5 mg at 01/02/21 0946   aspirin tablet 325 mg, 325 mg, Oral, Daily, Ghimire, Kuber, MD, 325 mg at 01/02/21 0945   clopidogrel (PLAVIX) tablet 75 mg, 75 mg, Oral, Daily, Ghimire, Kuber, MD, 75 mg at 01/02/21 0946   labetalol (NORMODYNE) injection 5 mg, 5 mg,  Intravenous, Q2H PRN, Lora Havens, MD, 5 mg at 12/29/20 1831   losartan (COZAAR) tablet 100 mg, 100 mg, Oral, Daily, Ghimire, Kuber, MD, 100 mg at 01/02/21 0945   pantoprazole (PROTONIX) EC tablet 40 mg, 40 mg, Oral, Daily, Ghimire, Kuber, MD, 40 mg at 01/02/21 0946   rosuvastatin (CRESTOR) tablet 20 mg, 20 mg, Oral, Daily, 20 mg at 01/02/21 0945 **OR** rosuvastatin (CRESTOR) tablet 20 mg, 20 mg, Per Tube, Daily, Lora Havens, MD   senna-docusate (Senokot-S) tablet 1 tablet, 1 tablet, Oral, BID, Barb Merino, MD, 1 tablet at 01/02/21 0946   Patients Current Diet:  Diet Order                  DIET DYS 3 Room service appropriate? Yes; Fluid consistency: Thin  Diet effective now                       Precautions / Restrictions Precautions Precautions: Fall Precaution Comments: RUE physical neglect. Right side hemiplegia. Aphasic Restrictions Weight Bearing Restrictions: No    Has the patient had 2 or more falls or a fall with injury in the past year? Yes   Prior Activity Level Community (5-7x/wk): Went out daily.  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   Prior Functional Level Self Care: Did the patient need help bathing, dressing, using the toilet or eating? Independent   Indoor Mobility: Did the patient need assistance with walking from room to room (with or without device)? Independent   Stairs: Did the patient need assistance with internal or external stairs (with or without device)? Independent   Functional Cognition: Did the patient need help planning regular tasks such as shopping or remembering to take medications? Independent   Patient Information Are you of Hispanic, Latino/a,or Spanish origin?: A. No, not of Hispanic, Latino/a, or Spanish origin What is your race?: B. Black or African American Do you need or want an interpreter to communicate with a doctor or health care staff?: 0. No   Patient's Response To:  Health Literacy and Transportation Is the patient able to respond to health literacy and transportation needs?: Yes Health Literacy - How often do you need to have someone help you when you read instructions, pamphlets, or other written material from your doctor or pharmacy?: Rarely In the past 12 months, has lack of transportation kept you from medical appointments or from getting medications?: No In the past 12 months, has lack of transportation kept you from meetings, work, or from getting things needed for daily living?: No   Development worker, international aid / [place] Devices/Equipment: None Home Equipment: None   Prior Device Use: Indicate devices/aids used by the patient prior to current illness, exacerbation or injury? None of the above   Current Functional Level Cognition   Overall Cognitive Status: Impaired/Different from baseline Difficult to assess due to: Impaired communication Orientation Level: Oriented X4 Following Commands: Follows multi-step commands inconsistently, Follows one step commands with increased time General Comments: Patient requires  occasional repeated verbal/tactile cueing to complete functional tasks and exercies Awareness:  (reduced awarensess to the right side of her body and face) Problem Solving: Impaired Problem Solving Impairment: Verbal basic Executive Function: Self Monitoring, Self Correcting Self Monitoring: Impaired Behaviors: Perseveration, Impulsive    Extremity Assessment (includes Sensation/Coordination)   Upper Extremity Assessment: RUE deficits/detail RUE Deficits / Details: Pt noted to have improved UE active movement at 2+/5 MMT for shoulder flexion and 2-/5 for shoulder  abduction. No active wrist or digit motion noted. RUE Sensation:  (unable to assess) RUE Coordination: decreased fine motor, decreased gross motor  Lower Extremity Assessment: Defer to PT evaluation RLE Deficits / Details: Decreased dorsiflexion, shuffling/step-to pattern on R side.     ADLs   Overall ADL's : Needs assistance/impaired Eating/Feeding: Set up, Sitting Grooming: Wash/dry face, Wash/dry hands, Oral care, Applying deodorant, Brushing hair, Minimal assistance, Bed level Upper Body Bathing: Moderate assistance, Sitting Lower Body Bathing: Moderate assistance, Sit to/from stand, Sitting/lateral leans Upper Body Dressing : Moderate assistance, Sitting Lower Body Dressing: Maximal assistance, Sitting/lateral leans Lower Body Dressing Details (indicate cue type and reason): Pt requried max A to don socks seated in chair. Pt confused and had trouble following commands. Pt placed sock on her hand rather than on her feet. Toilet Transfer: Minimal assistance, BSC/3in1 Toilet Transfer Details (indicate cue type and reason): with quad cane. Physical assist provided to advance right foot to take steps due to weakness and hospital socks gripping floor. Toileting- Clothing Manipulation and Hygiene: Total assistance, Sitting/lateral lean, Sit to/from stand Functional mobility during ADLs: Min guard     Mobility   Overal bed  mobility: Needs Assistance Bed Mobility: Supine to Sit, Sit to Supine Supine to sit: Supervision Sit to supine: Min guard General bed mobility comments: requires repeated verbal cueing for safety mostly due to right sided neglict and inability to use RUE for functional task     Transfers   Overall transfer level: Needs assistance Equipment used: None Transfers: Sit to/from Stand, Bed to chair/wheelchair/BSC Sit to Stand: Min guard Bed to/from chair/wheelchair/BSC transfer type:: Step pivot Step pivot transfers: Min guard General transfer comment: requires repeated verbal cueing for safety     Ambulation / Gait / Stairs / Wheelchair Mobility   Ambulation/Gait Ambulation/Gait assistance: Counsellor (Feet): 75 Feet Assistive device: None, 1 person hand held assist Gait Pattern/deviations: Decreased step length - right, Decreased stance time - right, Decreased dorsiflexion - right, Steppage General Gait Details: slow labored cadence with decreased right dorsilfexion, occasional dragging of right foot, most diffiuclty making turns to the right requiring verbal cues and tactile assistance for safety Gait velocity: Decreased     Posture / Balance Dynamic Sitting Balance Sitting balance - Comments: Good seated EOB Balance Overall balance assessment: Needs assistance Sitting-balance support: Feet supported, No upper extremity supported Sitting balance-Leahy Scale: Good Sitting balance - Comments: Good seated EOB Standing balance support: During functional activity, No upper extremity supported Standing balance-Leahy Scale: Fair Standing balance comment: fair/good static, fair/poor dynamic due to right sided neglict with bumping into nearby objects, walls     Special needs/care consideration None    Previous Home Environment  Living Arrangements: Alone  Lives With: Alone Available Help at Discharge: Family, Other (Comment), Available PRN/intermittently Type of Home:  House Home Layout: One level, Able to live on main level with bedroom/bathroom Home Access: Stairs to enter Entrance Stairs-Rails: Left Entrance Stairs-Number of Steps: 4-5 Bathroom Shower/Tub: Chiropodist: [place]: No   Discharge Living Setting Plans for Discharge Living Setting: House, Alone (Family planning to assit 24/7) Type of Home at Discharge: House Discharge Home Layout: One level Discharge Home Access: Stairs to enter Entrance Stairs-Rails: Left Entrance Stairs-Number of Steps: 4 at back entrance Discharge Bathroom Shower/Tub: Tub/shower unit, Curtain Discharge Bathroom Toilet: Standard Discharge Bathroom Accessibility: Yes How Accessible: Accessible via walker Does the patient have any problems obtaining your medications?: No   Social/Family/Support Systems Patient  Roles: Parent (Has a daughter.) Contact Information: Natasha Vazquez - daughter - [phone] Anticipated Caregiver: Dtr and family Ability/Limitations of Caregiver: Dtr has worked out 24/7 coverage for patient after discharge home from rehab. Caregiver Availability: 24/7 Discharge Plan Discussed with Primary Caregiver: Yes Is Caregiver In Agreement with Plan?: Yes Does Caregiver/Family have Issues with Lodging/Transportation while Pt is in Rehab?: No   Pt's sister, Natasha Vazquez, to come stay with her to assist her in her home after discharge from CIR   Goals Patient/Family Goal for Rehab: PT/OT/SLP supervision to min assist goals Expected length of stay: 7 to 10 days Pt/Family Agrees to Admission and willing to participate: Yes Program Orientation Provided & Reviewed with Pt/Caregiver Including Roles  & Responsibilities: Yes   Decrease burden of Care through IP rehab admission: N/A   Possible need for SNF placement upon discharge: Not planned   Patient Condition: I have reviewed medical records from [facility], spoken with CSW, and daughter. I discussed via  phone for inpatient rehabilitation assessment.  Patient will benefit from ongoing PT, OT, and SLP, can actively participate in 3 hours of therapy a day 5 days of the week, and can make measurable gains during the admission.  Patient will also benefit from the coordinated team approach during an Inpatient Acute Rehabilitation admission.  The patient will receive intensive therapy as well as Rehabilitation physician, nursing, social worker, and care management interventions.  Due to bladder management, bowel management, safety, skin/wound care, disease management, medication administration, pain management, and patient education the patient requires 24 hour a day rehabilitation nursing.  The patient is currently min assist with mobility and basic ADLs.  Discharge setting and therapy post discharge at home with home health is anticipated.  Patient has agreed to participate in the Acute Inpatient Rehabilitation Program and will admit today.   Preadmission Screen Completed By:  Cleatrice Burke, 01/02/2021 10:03 AM ______________________________________________________________________   Discussed status with Dr. Ranell Patrick on  01/02/2021 at 1003 and received approval for admission today.   Admission Coordinator:  Cleatrice Burke, RN, time  1003 Date 12/23/2020    Assessment/Plan: Diagnosis: Left frontal, parietal, temporal, and basal ganglia infarcts Does the need for close, 24 hr/day Medical supervision in concert with the patient's rehab needs make it unreasonable for this patient to be served in a less intensive setting? Yes Co-Morbidities requiring supervision/potential complications: overweight (BMI 25.74), constipation, joint pain, myalgias, grade 1 diastolic dysfunction, weakness, impaired speech Due to bladder management, bowel management, safety, skin/wound care, disease management, medication administration, pain management, and patient education, does the patient require 24 hr/day rehab  nursing? Yes Does the patient require coordinated care of a physician, rehab nurse, PT, OT, and SLP to address physical and functional deficits in the context of the above medical diagnosis(es)? Yes Addressing deficits in the following areas: balance, endurance, locomotion, strength, transferring, bowel/bladder control, bathing, dressing, feeding, grooming, toileting, cognition, speech, language, and psychosocial support Can the patient actively participate in an intensive therapy program of at least 3 hrs of therapy 5 days a week? Yes The potential for patient to make measurable gains while on inpatient rehab is excellent Anticipated functional outcomes upon discharge from inpatient rehab: supervision PT, supervision OT, supervision SLP Estimated rehab length of stay to reach the above functional goals is: 5-7 days Anticipated discharge destination: Home 10. Overall Rehab/Functional Prognosis: excellent     MD Signature: Leeroy Cha, MD         Revision History  Note Details  Author Ranell Patrick, Clide Deutscher, MD File Time 01/02/2021 10:09 AM  Author Type Physician Status Signed  Last Editor Izora Ribas, [place] # [id number] Admit Date 01/02/2021

## 2021-01-02 NOTE — Progress Notes (Signed)
Inpatient Rehabilitation Admission Medication Review by a Pharmacist  A complete drug regimen review was completed for this patient to identify any potential clinically significant medication issues.  High Risk Drug Classes Is patient taking? Indication by Medication  Antipsychotic No   Anticoagulant No   Antibiotic No   Opioid No   Antiplatelet Yes Aspirin/Clopidogrel- secondary CVA proph prevention  Hypoglycemics/insulin No   Vasoactive Medication Yes Losartan/Amlodipine- blood pressure  Chemotherapy No   Other Yes Crestor- HLD Protonix- GERD     Type of Medication Issue Identified Description of Issue Recommendation(s)  Drug Interaction(s) (clinically significant)     Duplicate Therapy  Aspirin/clopidogrel DAP therapy to continue for a total of 3 months and then aspirin alone thereafter. End date for clopidogrel: March 20, 2021  Allergy     No Medication Administration End Date     Incorrect Dose     Additional Drug Therapy Needed     Significant med changes from prior encounter (inform family/care partners about these prior to discharge).    Other  PTA meds (admission dated 12/25/2020):  Refresh eye drops, viatmin D3, Restasis, Preservision AREDS, Omega-3 fatty acids, Systane eye drops, Metrogel, OxyIR Restart PTA meds as clinical condition warrants  I am unable to verify where a prescription for metrogel is currently active at her outpatient pharmacy     Clinically significant medication issues were identified that warrant physician communication and completion of prescribed/recommended actions by midnight of the next day:  No  Pharmacists comments:   Mobic and ibuprofen also noted as medications patient was taking prior to her admission to [facility]. Would continue to hold NSAIDS at this time given history of GI bleed and current regimen including DAPT (aspirin/plavix). Unable to verify if there is an active order for Mobic through her outpatient pharmacy. If  NSAID therapy is to continue, I would also recommend using mono-NSAID therapy.   Time spent performing this drug regimen review (minutes):  30   Makynleigh Breslin BS, PharmD, BCPS Clinical Pharmacist 01/02/2021 1:38 PM

## 2021-01-02 NOTE — Plan of Care (Signed)
  Problem: Education: Goal: Knowledge of General Education information will improve Description: Including pain rating scale, medication(s)/side effects and non-pharmacologic comfort measures Outcome: Adequate for Discharge   Problem: Health Behavior/Discharge Planning: Goal: Ability to manage health-related needs will improve Outcome: Adequate for Discharge   Problem: Clinical Measurements: Goal: Ability to maintain clinical measurements within normal limits will improve Outcome: Adequate for Discharge Goal: Will remain free from infection Outcome: Adequate for Discharge Goal: Diagnostic test results will improve Outcome: Adequate for Discharge Goal: Respiratory complications will improve Outcome: Adequate for Discharge Goal: Cardiovascular complication will be avoided Outcome: Adequate for Discharge   Problem: Activity: Goal: Risk for activity intolerance will decrease Outcome: Adequate for Discharge   Problem: Nutrition: Goal: Adequate nutrition will be maintained Outcome: Adequate for Discharge   Problem: Coping: Goal: Level of anxiety will decrease Outcome: Adequate for Discharge   Problem: Elimination: Goal: Will not experience complications related to bowel motility Outcome: Adequate for Discharge Goal: Will not experience complications related to urinary retention Outcome: Adequate for Discharge   Problem: Pain Managment: Goal: General experience of comfort will improve Outcome: Adequate for Discharge   Problem: Safety: Goal: Ability to remain free from injury will improve Outcome: Adequate for Discharge   Problem: Skin Integrity: Goal: Risk for impaired skin integrity will decrease Outcome: Adequate for Discharge   Problem: Education: Goal: Knowledge of disease or condition will improve Outcome: Adequate for Discharge Goal: Knowledge of secondary prevention will improve (SELECT ALL) Outcome: Adequate for Discharge Goal: Knowledge of patient specific  risk factors will improve (INDIVIDUALIZE FOR PATIENT) Outcome: Adequate for Discharge   Problem: Coping: Goal: Will verbalize positive feelings about self Outcome: Adequate for Discharge   Problem: Self-Care: Goal: Ability to participate in self-care as condition permits will improve Outcome: Adequate for Discharge

## 2021-01-02 NOTE — H&P (Signed)
Physical Medicine and Rehabilitation Admission H&P  CC: Left MCA stroke  HPI: Natasha Vazquez is a 70 year old right-handed female with history of hypertension, GI bleed 20 years ago as well as hyperlipidemia.  Per chart review patient lives alone.  Independent household and community ambulator.  She does not drive.  1 level home 4 steps to entry.  Her brother lives next door.  Presented to [facility] [address] 12/25/2020 with right side weakness, aphasia and altered mental status.  Blood pressure was elevated 178/73.  Admission chemistries unremarkable except glucose 147, alcohol negative.  CT angiogram head and neck hypodensity in the posterior left frontal lobe concerning for acute/subacute infarct.  No intracranial large vessel occlusion.  Multifocal narrowing of the right A1 and bilateral distal MCA branches.  No hemodynamically significant stenosis in the neck.  MRI of the brain extensive acute infarcts throughout the left cerebrum, including left frontal, parietal and temporal lobes and left basal ganglia.  Mild petechial hemorrhage without mass occupying hemorrhagic transformation.  Patient did not receive tPA.  Echocardiogram with ejection fraction of 65 to 70% no regional wall motion abnormalities grade 1 diastolic dysfunction.  Neurology follow-up currently maintained on aspirin 325 mg daily and Plavix 75 mg daily for CVA prophylaxis for 3 months then aspirin alone.  She is currently on a mechanical soft diet with thin liquids.  Therapy evaluations completed due to patient's right side weakness and dysarthric speech was admitted for a comprehensive rehab program. Currently unable to communicate other than saying "no."  Review of Systems  Constitutional:  Negative for chills and fever.  HENT:  Negative for hearing loss.   Eyes:  Negative for blurred vision and double vision.  Respiratory:  Negative for cough and shortness of breath.   Cardiovascular:  Negative for chest pain, palpitations and leg swelling.   Gastrointestinal:  Positive for constipation. Negative for heartburn, nausea and vomiting.  Genitourinary:  Negative for dysuria, flank pain and hematuria.  Musculoskeletal:  Positive for joint pain and myalgias.  Skin:  Negative for rash.  Neurological:  Positive for speech change and weakness.  All other systems reviewed and are negative. Past Medical History:  Diagnosis Date   Hypertension    TMJ (dislocation of temporomandibular joint)    Past Surgical History:  Procedure Laterality Date   ABDOMINAL HYSTERECTOMY     CHOLECYSTECTOMY     IR ANGIO INTRA EXTRACRAN SEL INTERNAL CAROTID BILAT MOD SED  09/16/2016   IR ANGIO VERTEBRAL SEL VERTEBRAL BILAT MOD SED  09/16/2016   MASS EXCISION Left 02/10/2020   Procedure: excision of soft tissue mass left upper back;  Surgeon: Jesusita Oka, MD;  Location: [place];  Service: General;  Laterality: Left;   History reviewed. No pertinent family history. Social History:  reports that she has never smoked. She has never used smokeless tobacco. She reports that she does not drink alcohol and does not use drugs. Allergies:  Allergies  Allergen Reactions   Lisinopril     Other reaction(s): cough   Penicillins Rash and Other (See Comments)    Has patient had a PCN reaction causing immediate rash, facial/tongue/throat swelling, SOB or lightheadedness with hypotension: Yes Has patient had a PCN reaction causing severe rash involving mucus membranes or skin necrosis: No Has patient had a PCN reaction that required hospitalization: Unknown Has patient had a PCN reaction occurring within the last 10 years: No If all of the above answers are "NO", then may proceed with Cephalosporin use.  Medications Prior to Admission  Medication Sig Dispense Refill   amLODipine (NORVASC) 2.5 MG tablet Take 2.5 mg by mouth daily.     carboxymethylcellulose (REFRESH PLUS) 0.5 % SOLN Place 1 drop into both eyes in the morning and at bedtime.      cholecalciferol (VITAMIN D3) 25 MCG (1000 UNIT) tablet Take 1,000 Units by mouth daily.     cycloSPORINE (RESTASIS) 0.05 % ophthalmic emulsion Place 1 drop into both eyes 2 (two) times daily.     Meclizine HCl 25 MG CHEW Chew 1 tablet by mouth daily as needed (dizziness).     metroNIDAZOLE (METROCREAM) 0.75 % cream Apply 1 application topically daily.     Multiple Vitamins-Minerals (PRESERVISION AREDS 2 PO) Take 1 tablet by mouth in the morning and at bedtime.     Omega-3 Fatty Acids (FISH OIL) 1000 MG CAPS Take 1 capsule by mouth daily.     Propylene Glycol (SYSTANE COMPLETE) 0.6 % SOLN Place 1 tablet into both eyes in the morning and at bedtime.      Drug Regimen Review Drug regimen was reviewed and remains appropriate with no significant issues identified  Home: Home Living Family/patient expects to be discharged to:: Inpatient rehab Living Arrangements: Alone Available Help at Discharge: Family, Other (Comment), Available PRN/intermittently Type of Home: House Home Access: Stairs to enter [employer] of Steps: 4-5 Entrance Stairs-Rails: Left Home Layout: One level, Able to live on main level with bedroom/bathroom Bathroom Shower/Tub: Chiropodist: Standard Home Equipment: None  Lives With: Alone   Functional History: Prior Function Prior Level of Function : Independent/Modified Independent Mobility Comments: Independent household and community ambulator, does not drive. ADLs Comments: Independent w/ ADL's.   Functional Status:  Mobility: Bed Mobility Overal bed mobility: Needs Assistance Bed Mobility: Supine to Sit, Sit to Supine Supine to sit: Supervision Sit to supine: Min guard General bed mobility comments: requires repeated verbal cueing for safety mostly due to right sided neglict and inability to use RUE for functional task Transfers Overall transfer level: Needs assistance Equipment used: None Transfers: Sit to/from Stand, Bed to  chair/wheelchair/BSC Sit to Stand: Min guard Bed to/from chair/wheelchair/BSC transfer type:: Step pivot Step pivot transfers: Min guard General transfer comment: requires repeated verbal cueing for safety Ambulation/Gait Ambulation/Gait assistance: Min guard Gait Distance (Feet): 75 Feet Assistive device: None, 1 person hand held assist Gait Pattern/deviations: Decreased step length - right, Decreased stance time - right, Decreased dorsiflexion - right, Steppage General Gait Details: slow labored cadence with decreased right dorsilfexion, occasional dragging of right foot, most diffiuclty making turns to the right requiring verbal cues and tactile assistance for safety Gait velocity: Decreased   ADL: ADL Overall ADL's : Needs assistance/impaired Eating/Feeding: Set up, Sitting Grooming: Wash/dry face, Wash/dry hands, Oral care, Applying deodorant, Brushing hair, Minimal assistance, Bed level Upper Body Bathing: Moderate assistance, Sitting Lower Body Bathing: Moderate assistance, Sit to/from stand, Sitting/lateral leans Upper Body Dressing : Moderate assistance, Sitting Lower Body Dressing: Maximal assistance, Sitting/lateral leans Lower Body Dressing Details (indicate cue type and reason): Pt requried max A to don socks seated in chair. Pt confused and had trouble following commands. Pt placed sock on her hand rather than on her feet. Toilet Transfer: Minimal assistance, BSC/3in1 Toilet Transfer Details (indicate cue type and reason): with quad cane. Physical assist provided to advance right foot to take steps due to weakness and hospital socks gripping floor. Toileting- Clothing Manipulation and Hygiene: Total assistance, Sitting/lateral lean, Sit to/from stand Functional mobility  during ADLs: Min guard   Cognition: Cognition Overall Cognitive Status: Impaired/Different from baseline Orientation Level: Oriented X4 Awareness:  (reduced awarensess to the right side of her body and  face) Problem Solving: Impaired Problem Solving Impairment: Verbal basic Executive Function: Self Monitoring, Self Correcting Self Monitoring: Impaired Behaviors: Perseveration, Impulsive Cognition Arousal/Alertness: Awake/alert Behavior During Therapy: WFL for tasks assessed/performed Overall Cognitive Status: Impaired/Different from baseline Area of Impairment: Following commands Following Commands: Follows multi-step commands inconsistently, Follows one step commands with increased time General Comments: Patient requires occasional repeated verbal/tactile cueing to complete functional tasks and exercies Difficult to assess due to: Impaired communication    Physical Exam: Blood pressure 140/72, pulse 83, temperature 98.5 F (36.9 C), temperature source Oral, resp. rate 16, height 5\' 8"  (1.727 m), weight 70.9 kg, SpO2 100 %. Gen: no distress, normal appearing HEENT: oral mucosa pink and moist, NCAT Cardio: Reg rate Chest: normal effort, normal rate of breathing Abd: soft, non-distended Ext: no edema Psych: pleasant, normal affect Skin: intact Neurological:     Comments: Patient is alert makes eye contact with examiner.  She does have expressive receptive aphasia- mostly says no. Appears to be able to understand conversation well and tries to to follow commands. RUE hemiplegia- testing likely limited by neglect as well, LUE 5/5. Right sided neglect  No results found for this or any previous visit (from the past 50 hour(s)). No results found.     Medical Problem List and Plan: 1.  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  Antithrombotics: -DVT/anticoagulation:  Mechanical: Antiembolism stockings, thigh (TED hose) Bilateral lower extremities  -antiplatelet therapy: Aspirin 325 mg daily and Plavix 75 mg daily x3 months then aspirin alone 3. Pain Management: Tylenol as  needed 4. Mood: Provide emotional support  -antipsychotic agents: N/A 5. Neuropsych: This patient is not capable of making decisions on her own behalf. 6. Skin/Wound Care: Routine skin checks 7. Fluids/Electrolytes/Nutrition: Routine in and outs with follow-up chemistries 8.  Hypertension.  Continue Norvasc 2.5 mg daily.Her Cozaar 100 mg daily was held due to blood pressure being soft and resume as needed.  Monitor with increased mobility 9.  Hyperlipidemia.  Continue Crestor 10.  History of remote GI bleed 20 years ago.  Continue Protonix.  I have personally performed a face to face diagnostic evaluation, including, but not limited to relevant history and physical exam findings, of this patient and developed relevant assessment and plan.  Additionally, I have reviewed and concur with the physician assistant's documentation above.  Izora Ribas, MD 01/02/2021

## 2021-01-02 NOTE — Care Management Important Message (Signed)
Important Message  Patient Details  Name: Natasha Vazquez MRN: [id number] Date of Birth: Dec 30, 1950   Medicare Important Message Given:  Yes     Tommy Medal 01/02/2021, 11:49 AM

## 2021-01-02 NOTE — Discharge Summary (Signed)
Natasha Vazquez, is a 70 y.o. female  DOB [birth year]  MRN [id number].  Admission date:  12/25/2020  Admitting Physician  Barb Merino, MD  Discharge Date:  01/02/2021   Primary MD  Carol Ada, MD  Recommendations for primary care physician for things to follow:   1)Please take Aspirin daily along with Plavix 75 mg daily for 21 days then after that STOP the aspirin and continue ONLY Plavix 75 mg daily indefinitely--for stroke prevention  2)Take Crestor 20 mg daily for secondary stroke prevention---Even if her lipid panel is within desired limits, patient should still take Crestor for it's Pleiotropic effects (beyond cholesterol lowering benefits)  3)Stop Losartan due to soft blood pressure  4) continue amlodipine 2.5 mg daily for BP control  5)Please follow-up with Neurologist Dr. Nadean Corwin 4 to 6 weeks for recheck and reevaluation - phone: [phone], Address: [address], [place], [place] [address] in 4 to 6 weeks for recheck and reevaluation.  Please call to make appointment with him  6)Avoid ibuprofen/Advil/Aleve/Motrin/Goody Powders/Naproxen/BC powders/Meloxicam/Diclofenac/Indomethacin and other Nonsteroidal anti-inflammatory medications as these will make you more likely to bleed and can cause stomach ulcers, can also cause Kidney problems.   Admission Diagnosis  Acute ischemic stroke [facility]) [I63.9] Cerebrovascular accident (CVA), unspecified mechanism ([place]) [I63.9] Acute ischemic left MCA stroke ([place]) [I63.512]   Discharge Diagnosis  Acute ischemic stroke [facility]) [I63.9] Cerebrovascular accident (CVA), unspecified mechanism ([place]) [I63.9] Acute ischemic left MCA stroke ([place]) [I63.512]    Principal Problem:   Acute ischemic left MCA stroke ([place]) Active Problems:   Acute ischemic stroke ([place])   Essential hypertension   Chronic heart failure with preserved ejection  fraction (HFpEF) (HCC)   Mixed hyperlipidemia      Past Medical History:  Diagnosis Date   Hypertension    TMJ (dislocation of temporomandibular joint)     Past Surgical History:  Procedure Laterality Date   ABDOMINAL HYSTERECTOMY     CHOLECYSTECTOMY     IR ANGIO INTRA EXTRACRAN SEL INTERNAL CAROTID BILAT MOD SED  09/16/2016   IR ANGIO VERTEBRAL SEL VERTEBRAL BILAT MOD SED  09/16/2016   MASS EXCISION Left 02/10/2020   Procedure: excision of soft tissue mass left upper back;  Surgeon: Jesusita Oka, MD;  Location: [place];  Service: General;  Laterality: Left;     HPI  from the history and physical done on the day of admission:    Chief Complaint: Altered mental status   HPI: Natasha Vazquez is a 70 y.o. female with medical history significant for hypertension, hyperlipidemia who presents to the emergency department via EMS due to altered mental status.  Patient was unable to provide history, history was obtained from ED physician and ED medical record as well as from son and daughter at bedside.  Per report, patient was last seen normal on 11/21 around 2 PM, son states that patient was seen on the floor, it was unknown how long she has been on the floor.  She was not responding,  EMS was activated and patient was taken to the ED for further evaluation and management.   ED Course:  In the emergency department, she was tachycardic, BP was elevated at 178/73..  Work-up in the ED showed normal CBC, normal BMP except for hyperglycemia.  AST 48, alcohol level was negative.  Influenza A, B, SARS coronavirus 2 was negative. CT angiography of head and neck showed hypodensity in the posterior left frontal lobe, which is concerning for an acute or subacute infarct. No intracranial large vessel occlusion. Ativan was given, IV hydration was provided.  Hospitalist was asked to admit patient for further evaluation and management.   Review of Systems: This cannot be obtained at this  time due to patient having aphasia     Hospital Course:    Brief Narrative:  Patient with history of hypertension hyperlipidemia who was found by her children in the evening at home unresponsive, unable to express and covered with feces.  EMS activated and brought to the ER.  Patient was found to have dense right upper extremity hemiplegia and evidence of left-sided stroke.     Assessment & Plan:   Principal Problem: Problem  Acute Ischemic Stroke (Hcc)  Acute Ischemic Left Mca Stroke (Hcc)  Chronic Heart Failure With Preserved Ejection Fraction (Hfpef) (Hcc)  Essential Hypertension      1)Acute ischemic stroke, left MCA territory stroke: -CT head findings, hypodense area left cerebrum. MRI of the brain, extensive left ACA and MCA territory infarct with cerebral edema, but No mass-effect. CTA of the head and neck, no large vessel occlusion but multiple intracranial stenosis. 2D echocardiogram without cardiac source of emboli, EF 55 to 50% with grade 1 diastolic dysfunction  -As per patient and patient's daughter patient's episode of GI bleed was more than 20 years ago -PTA she was taking Mobic without any GI bleed - take Aspirin 81 mg daily along with Plavix 75 mg daily for 21 days then after that STOP the aspirin and continue ONLY Plavix 75 mg daily indefinitely--for stroke prevention (per SAMPRISS trial)--Avoid Nsaids LDL 77--Take Crestor 20 mg daily for secondary stroke prevention---Even if her lipid panel is within desired limits, patient should still take Crestor for it's Pleiotropic effects (beyond cholesterol lowering benefits)  --No arrhythmias on telemetry monitoring for last 7 days here in the hospital -She was given event monitoring by cardiology office.  They will follow-up. Hemoglobin A1c, 5.6 -Continue PT OT and speech therapy at [facility]   2)Dysphagia--- secondary to acute stroke as above #1 2 diet with nectar thick liquids.  -Continue speech therapy at  [facility]  3)-Antiplatelet therapy-- Neurology recommended aspirin 325 mg daily and Plavix 75 mg daily for 3 months and then continue aspirin.  History of GI bleed more than 20 years ago . PTA Patient was taking Mobic and intermittent ibuprofen with no recurrence of Gi Bleed symptoms.  Recent negative colonoscopy.  Benefits outweigh risk.   -Discussed risk versus benefit of DAPT with patient daughter and patient as well as patient's sister Burgess Estelle this time family and patient would like to proceed with DAPT (per SAMPRISS trial)--for secondary stroke prophylaxis -Okay to use Protonix for GI prophylaxis    4)HTN--stop losartan due to soft BP, continue amlodipine 2.5 mg daily , avoid hypotension   5)HFpEF--chronic diastolic dysfunction CHF EF is 65 to 70% with grade 1 diastolic dysfunction, clinically appears compensated and euvolemic at this time -   Code Status: Full code Family Communication: Sister Nunzio Cory at the bedside and daughter Ms Blanch Media  Parker by phone Disposition Plan:  Dc to [facility]     Consultants:  Neurology  Discharge Condition: Medically stable  Follow UP--outpatient neurology follow-up advised   Follow-up Information     Phillips Odor, MD. Schedule an appointment as soon as possible for a visit in 6 week(s).   Specialty: Neurology Why: stroke follow up Contact information: [address] [place] [address] [phone]                 Diet and Activity recommendation:  As advised  Discharge Instructions    Discharge Instructions     Call MD for:  difficulty breathing, headache or visual disturbances   Complete by: As directed    Call MD for:  persistant dizziness or light-headedness   Complete by: As directed    Call MD for:  persistant nausea and vomiting   Complete by: As directed    Call MD for:  temperature >100.4   Complete by: As directed    Diet - low sodium heart healthy   Complete by: As directed    Discharge instructions   Complete by: As  directed    1)Please take Aspirin daily along with Plavix 75 mg daily for 21 days then after that STOP the aspirin and continue ONLY Plavix 75 mg daily indefinitely--for stroke prevention  2)Take Crestor 20 mg daily for secondary stroke prevention---Even if her lipid panel is within desired limits, patient should still take Crestor for it's Pleiotropic effects (beyond cholesterol lowering benefits)  3)Stop Losartan due to soft blood pressure  4) continue amlodipine 2.5 mg daily for BP control  5)Please follow-up with Neurologist Dr. Nadean Corwin 4 to 6 weeks for recheck and reevaluation - phone: [phone], Address: [address], [place], [place] [address] in 4 to 6 weeks for recheck and reevaluation.  Please call to make appointment with him  6)Avoid ibuprofen/Advil/Aleve/Motrin/Goody Powders/Naproxen/BC powders/Meloxicam/Diclofenac/Indomethacin and other Nonsteroidal anti-inflammatory medications as these will make you more likely to bleed and can cause stomach ulcers, can also cause Kidney problems.   Increase activity slowly   Complete by: As directed         Discharge Medications     Allergies as of 01/02/2021       Reactions   Lisinopril    Other reaction(s): cough   Penicillins Rash, Other (See Comments)   Has patient had a PCN reaction causing immediate rash, facial/tongue/throat swelling, SOB or lightheadedness with hypotension: Yes Has patient had a PCN reaction causing severe rash involving mucus membranes or skin necrosis: No Has patient had a PCN reaction that required hospitalization: Unknown Has patient had a PCN reaction occurring within the last 10 years: No If all of the above answers are "NO", then may proceed with Cephalosporin use.        Medication List     STOP taking these medications    ibuprofen 800 MG tablet Commonly known as: ADVIL   losartan 100 MG tablet Commonly known as: COZAAR   meloxicam 15 MG tablet Commonly known  as: MOBIC   oxyCODONE 5 MG immediate release tablet Commonly known as: Roxicodone       TAKE these medications    amLODipine 2.5 MG tablet Commonly known as: NORVASC Take 2.5 mg by mouth daily.   aspirin EC 325 MG tablet Take 1 tablet (325 mg total) by mouth daily for 21 days. Please take Aspirin daily along with Plavix 75 mg daily for 21 days then after that STOP the aspirin and  continue ONLY Plavix 75 mg daily indefinitely--for stroke prevention   carboxymethylcellulose 0.5 % Soln Commonly known as: REFRESH PLUS Place 1 drop into both eyes in the morning and at bedtime.   cholecalciferol 25 MCG (1000 UNIT) tablet Commonly known as: VITAMIN D3 Take 1,000 Units by mouth daily.   clopidogrel 75 MG tablet Commonly known as: Plavix Take 1 tablet (75 mg total) by mouth daily. Please take Aspirin daily along with Plavix 75 mg daily for 21 days then after that STOP the aspirin and continue ONLY Plavix 75 mg daily indefinitely--for stroke prevention   cycloSPORINE 0.05 % ophthalmic emulsion Commonly known as: RESTASIS Place 1 drop into both eyes 2 (two) times daily.   Fish Oil 1000 MG Caps Take 1 capsule by mouth daily.   Meclizine HCl 25 MG Chew Chew 1 tablet by mouth daily as needed (dizziness).   metroNIDAZOLE 0.75 % cream Commonly known as: METROCREAM Apply 1 application topically daily.   pantoprazole 40 MG tablet Commonly known as: Protonix Take 1 tablet (40 mg total) by mouth daily. For GI prophylaxis while on aspirin/Plavix   PRESERVISION AREDS 2 PO Take 1 tablet by mouth in the morning and at bedtime.   rosuvastatin 20 MG tablet Commonly known as: Crestor Take 1 tablet (20 mg total) by mouth daily. What changed:  medication strength how much to take when to take this additional instructions   Systane Complete 0.6 % Soln Generic drug: Propylene Glycol Place 1 tablet into both eyes in the morning and at bedtime.       Major procedures and Radiology  Reports - PLEASE review detailed and final reports for all details, in brief -   CT ANGIO HEAD NECK W WO CM  Result Date: 12/25/2020 CLINICAL DATA:  Altered mental status EXAM: CT ANGIOGRAPHY HEAD AND NECK TECHNIQUE: Multidetector CT imaging of the head and neck was performed using the standard protocol during bolus administration of intravenous contrast. Multiplanar CT image reconstructions and MIPs were obtained to evaluate the vascular anatomy. Carotid stenosis measurements (when applicable) are obtained utilizing NASCET criteria, using the distal internal carotid diameter as the denominator. CONTRAST:  13mL OMNIPAQUE IOHEXOL 350 MG/ML SOLN COMPARISON:  No prior CTA, correlation is made with 08/12/2016 MRI and MRA head FINDINGS: CT HEAD FINDINGS Brain: Hypodensity in the left posterior frontal lobe cortex and white matter (series 5, image 20). No acute hemorrhage, mass, mass effect, or midline shift. Parenchymal calcifications in the left temporal and posterior frontal lobe, which may be vascular calcifications. Vascular: No hyperdense vessel. Calcifications along the expected course of the left MCA. Skull: Normal. Negative for fracture or focal lesion. Sinuses: Small osteoma in the left ethmoid air cells. Otherwise negative. Orbits: Status post bilateral lens replacements. Review of the MIP images confirms the above findings CTA NECK FINDINGS Aortic arch: Standard branching. Imaged portion shows no evidence of aneurysm or dissection. No significant stenosis of the major arch vessel origins. Aortic atherosclerosis. Right carotid system: No evidence of dissection, stenosis (50% or greater) or occlusion. Left carotid system: No evidence of dissection, stenosis (50% or greater) or occlusion. Vertebral arteries: Codominant. No evidence of dissection, stenosis (50% or greater) or occlusion. Skeleton: No acute osseous abnormality. Degenerative changes in the cervical spine and right greater than left  temporomandibular joints. Other neck: Subcentimeter right thyroid lobe nodule. Otherwise negative Upper chest: Right greater than left apical pleuroparenchymal scarring. No focal pulmonary opacity or pleural effusion. Review of the MIP images confirms the above findings CTA HEAD  FINDINGS Anterior circulation: Both internal carotid arteries are patent to the termini, without stenosis or other abnormality. A1 segments patent, with multifocal narrowing of the right A1. Normal anterior communicating artery. Anterior cerebral arteries are patent to their distal aspects. No M1 stenosis or occlusion, although calcifications are noted in the left M1 segment. Normal MCA bifurcations. Distal MCA branches perfused with moderate irregularity bilaterally and calcifications in the left MCA branches. Posterior circulation: Vertebral arteries patent to the vertebrobasilar junction without stenosis. Posterior inferior cerebral arteries patent bilaterally. Basilar patent to its distal aspect. Superior cerebellar arteries patent bilaterally. PCAs perfused to their distal aspects without focal stenosis. Venous sinuses: As permitted by contrast timing, patent. Anatomic variants: None significant. Review of the MIP images confirms the above findings IMPRESSION: 1. Hypodensity in the posterior left frontal lobe, which is concerning for an acute or subacute infarct. 2. No intracranial large vessel occlusion. Multifocal calcifications in the left MCA, likely with multifocal stenosis. 3. Multifocal narrowing of the right A1 and bilateral distal MCA branches. 4. No hemodynamically significant stenosis in the neck. 5. Subcentimeter incidental thyroid nodule. No follow-up imaging is recommended. Reference: J Am Coll Radiol. 2015 Feb;12(2): 143-50 6. Aortic Atherosclerosis (ICD10-I70.0). These results were called by telephone at the time of interpretation on 12/25/2020 at 11:29 pm to provider [facility] , who verbally acknowledged these results.  Electronically Signed   By: Merilyn Baba M.D.   On: 12/25/2020 23:29   MR BRAIN WO CONTRAST  Result Date: 12/26/2020 CLINICAL DATA:  Neuro deficit, acute, stroke suspected EXAM: MRI HEAD WITHOUT CONTRAST TECHNIQUE: Multiplanar, multiecho pulse sequences of the brain and surrounding structures were obtained without intravenous contrast. COMPARISON:  December 25, 2020 CT/CTA.  MRI 08/12/2016. FINDINGS: Brain: Extensive acute infarcts throughout the left cerebrum, including left frontal, parietal, and temporal lobes and left basal ganglia. Associated edema without significant mass effect. No midline shift. No hydrocephalus, mass lesion, or extra-axial fluid collection. Additional moderate scattered T2/FLAIR hyperintensities in the white matter, nonspecific but compatible with chronic microvascular ischemic disease. Small amount of curvilinear susceptibility artifact in the left frontal parietal region in the region of acute infarct, likely petechial hemorrhage. No mass occupying hemorrhage. Vascular: Major arterial flow voids are maintained at the skull base. Further evaluated on CTA from December 25, 2020. Skull and upper cervical spine: Normal marrow signal. Sinuses/Orbits: Clear sinuses.  Unremarkable orbits. Other: No sizable mastoid effusions. IMPRESSION: 1. Extensive acute infarcts throughout the left cerebrum, including left frontal, parietal, and temporal lobes and left basal ganglia. Mild petechial hemorrhage without mass occupying hemorrhagic transformation. Edema without significant mass effect. 2. Moderate chronic microvascular ischemic disease. Electronically Signed   By: Margaretha Sheffield M.D.   On: 12/26/2020 12:18   ECHOCARDIOGRAM COMPLETE  Result Date: 12/26/2020    ECHOCARDIOGRAM REPORT   Patient Name:   DANAKA LLERA Date of Exam: 12/26/2020 Medical Rec #:  [id number]      Height:       68.0 in Accession #:    [id number]     Weight:       169.3 lb Date of Birth:  [birth year]     BSA:           1.904 m Patient Age:    68 years       BP:           156/78 mmHg Patient Gender: F              HR:  97 bpm. Exam Location:  [facility] Procedure: 2D Echo, Cardiac Doppler and Color Doppler Indications:    Stroke  History:        Patient has no prior history of Echocardiogram examinations.                 Stroke; Risk Factors:Hypertension and Dyslipidemia.  Sonographer:    Wenda Low Referring Phys: [id number] OLADAPO ADEFESO IMPRESSIONS  1. Left ventricular ejection fraction, by estimation, is 65 to 70%. The left ventricle has normal function. The left ventricle has no regional wall motion abnormalities. Left ventricular diastolic parameters are consistent with Grade I diastolic dysfunction (impaired relaxation).  2. Right ventricular systolic function is normal. The right ventricular size is normal. There is normal pulmonary artery systolic pressure.  3. The mitral valve is normal in structure. No evidence of mitral valve regurgitation. No evidence of mitral stenosis.  4. The aortic valve is tricuspid. Aortic valve regurgitation is not visualized. No aortic stenosis is present.  5. The inferior vena cava is normal in size with greater than 50% respiratory variability, suggesting right atrial pressure of 3 mmHg. FINDINGS  Left Ventricle: Left ventricular ejection fraction, by estimation, is 65 to 70%. The left ventricle has normal function. The left ventricle has no regional wall motion abnormalities. The left ventricular internal cavity size was normal in size. There is  no left ventricular hypertrophy. Left ventricular diastolic parameters are consistent with Grade I diastolic dysfunction (impaired relaxation). Normal left ventricular filling pressure. Right Ventricle: The right ventricular size is normal. No increase in right ventricular wall thickness. Right ventricular systolic function is normal. There is normal pulmonary artery systolic pressure. The tricuspid regurgitant velocity is 2.28 m/s,  and  with an assumed right atrial pressure of 3 mmHg, the estimated right ventricular systolic pressure is 90.2 mmHg. Left Atrium: Left atrial size was normal in size. Right Atrium: Right atrial size was normal in size. Pericardium: There is no evidence of pericardial effusion. Mitral Valve: The mitral valve is normal in structure. No evidence of mitral valve regurgitation. No evidence of mitral valve stenosis. MV peak gradient, 6.8 mmHg. The mean mitral valve gradient is 3.0 mmHg. Tricuspid Valve: The tricuspid valve is normal in structure. Tricuspid valve regurgitation is not demonstrated. No evidence of tricuspid stenosis. Aortic Valve: The aortic valve is tricuspid. Aortic valve regurgitation is not visualized. No aortic stenosis is present. Aortic valve mean gradient measures 4.0 mmHg. Aortic valve peak gradient measures 7.7 mmHg. Aortic valve area, by VTI measures 2.84 cm. Pulmonic Valve: The pulmonic valve was not well visualized. Pulmonic valve regurgitation is not visualized. No evidence of pulmonic stenosis. Aorta: The aortic root is normal in size and structure. Venous: The inferior vena cava is normal in size with greater than 50% respiratory variability, suggesting right atrial pressure of 3 mmHg. IAS/Shunts: No atrial level shunt detected by color flow Doppler.  LEFT VENTRICLE PLAX 2D LVIDd:         4.20 cm     Diastology LVIDs:         2.40 cm     LV e' medial:    10.40 cm/s LV PW:         1.10 cm     LV E/e' medial:  9.1 LV IVS:        1.00 cm     LV e' lateral:   13.60 cm/s LVOT diam:     2.00 cm     LV E/e' lateral: 7.0 LV SV:  82 LV SV Index:   43 LVOT Area:     3.14 cm  LV Volumes (MOD) LV vol d, MOD A2C: 50.0 ml LV vol d, MOD A4C: 66.8 ml LV vol s, MOD A2C: 13.2 ml LV vol s, MOD A4C: 24.0 ml LV SV MOD A2C:     36.8 ml LV SV MOD A4C:     66.8 ml LV SV MOD BP:      40.7 ml RIGHT VENTRICLE RV Basal diam:  2.50 cm RV Mid diam:    2.10 cm RV S prime:     13.60 cm/s TAPSE (M-mode): 2.5 cm  LEFT ATRIUM             Index        RIGHT ATRIUM           Index LA diam:        3.40 cm 1.79 cm/m   RA Area:     11.70 cm LA Vol (A2C):   38.4 ml 20.17 ml/m  RA Volume:   25.30 ml  13.29 ml/m LA Vol (A4C):   39.6 ml 20.80 ml/m LA Biplane Vol: 39.1 ml 20.54 ml/m  AORTIC VALVE                    PULMONIC VALVE AV Area (Vmax):    2.80 cm     PV Vmax:       0.92 m/s AV Area (Vmean):   2.71 cm     PV Peak grad:  3.4 mmHg AV Area (VTI):     2.84 cm AV Vmax:           139.00 cm/s AV Vmean:          99.100 cm/s AV VTI:            0.288 m AV Peak Grad:      7.7 mmHg AV Mean Grad:      4.0 mmHg LVOT Vmax:         124.00 cm/s LVOT Vmean:        85.500 cm/s LVOT VTI:          0.260 m LVOT/AV VTI ratio: 0.90  AORTA Ao Root diam: 2.70 cm MITRAL VALVE                TRICUSPID VALVE MV Area (PHT): 4.49 cm     TR Peak grad:   20.8 mmHg MV Area VTI:   3.73 cm     TR Vmax:        228.00 cm/s MV Peak grad:  6.8 mmHg MV Mean grad:  3.0 mmHg     SHUNTS MV Vmax:       1.30 m/s     Systemic VTI:  0.26 m MV Vmean:      82.5 cm/s    Systemic Diam: 2.00 cm MV Decel Time: 169 msec MV E velocity: 94.60 cm/s MV A velocity: 121.00 cm/s MV E/A ratio:  0.78 Carlyle Dolly MD Electronically signed by Carlyle Dolly MD Signature Date/Time: 12/26/2020/11:20:27 AM    Final     Micro Results   Recent Results (from the past 240 hour(s))  Resp Panel by RT-PCR (Flu A&B, Covid) Nasopharyngeal Swab     Status: None   Collection Time: 12/25/20  8:40 PM   Specimen: Nasopharyngeal Swab; Nasopharyngeal(NP) swabs in vial transport medium  Result Value Ref Range Status   SARS Coronavirus 2 by RT PCR NEGATIVE NEGATIVE Final    Comment: (NOTE)  SARS-CoV-2 target nucleic acids are NOT DETECTED.  The SARS-CoV-2 RNA is generally detectable in upper respiratory specimens during the acute phase of infection. The lowest concentration of SARS-CoV-2 viral copies this assay can detect is 138 copies/mL. A negative result does not preclude  SARS-Cov-2 infection and should not be used as the sole basis for treatment or other patient management decisions. A negative result may occur with  improper specimen collection/handling, submission of specimen other than nasopharyngeal swab, presence of viral mutation(s) within the areas targeted by this assay, and inadequate number of viral copies(<138 copies/mL). A negative result must be combined with clinical observations, patient history, and epidemiological information. The expected result is Negative.  Fact Sheet for Patients:  EntrepreneurPulse.com.au  Fact Sheet for Healthcare Providers:  IncredibleEmployment.be  This test is no t yet approved or cleared by the [place] FDA and  has been authorized for detection and/or diagnosis of SARS-CoV-2 by FDA under an Emergency Use Authorization (EUA). This EUA will remain  in effect (meaning this test can be used) for the duration of the COVID-19 declaration under Section 564(b)(1) of the Act, 21 U.S.C.section 360bbb-3(b)(1), unless the authorization is terminated  or revoked sooner.       Influenza A by PCR NEGATIVE NEGATIVE Final   Influenza B by PCR NEGATIVE NEGATIVE Final    Comment: (NOTE) The Xpert Xpress SARS-CoV-2/FLU/RSV plus assay is intended as an aid in the diagnosis of influenza from Nasopharyngeal swab specimens and should not be used as a sole basis for treatment. Nasal washings and aspirates are unacceptable for Xpert Xpress SARS-CoV-2/FLU/RSV testing.  Fact Sheet for Patients: EntrepreneurPulse.com.au  Fact Sheet for Healthcare Providers: IncredibleEmployment.be  This test is not yet approved or cleared by the [place] FDA and has been authorized for detection and/or diagnosis of SARS-CoV-2 by FDA under an Emergency Use Authorization (EUA). This EUA will remain in effect (meaning this test can be used) for the duration of  the COVID-19 declaration under Section 564(b)(1) of the Act, 21 U.S.C. section 360bbb-3(b)(1), unless the authorization is terminated or revoked.  Performed at [facility], [address], [place], [place] [address]    Today   Subjective    Dallys Nowakowski today has no new complaints- Sister Nunzio Cory at the bedside and daughter Ms Terence Lux by phone -No chest pains no palpitations no dizziness right upper extremity weakness and speech difficulties persist   Patient has been seen and examined prior to discharge   Objective   Blood pressure 117/90, pulse 92, temperature (!) 97.5 F (36.4 C), temperature source Oral, resp. rate 17, height 5\' 8"  (1.727 m), weight 76.8 kg, SpO2 99 %.   Intake/Output Summary (Last 24 hours) at 01/02/2021 1355 Last data filed at 01/02/2021 0900 Gross per 24 hour  Intake 240 ml  Output --  Net 240 ml    Exam Gen:- Awake Alert, no acute distress  HEENT:- [place].AT, No sclera icterus Neck-Supple Neck,No JVD,.  Lungs-  CTAB , good air movement bilaterally  CV- S1, S2 normal, regular Abd-  +ve B.Sounds, Abd Soft, No tenderness,    Extremity/Skin:- No  edema,   good pulses Psych-affect is appropriate, oriented x3 Neuro-right upper extremity weakness persist, facial asymmetry, speech disturbance (expressive aphasia) and swallowing difficulties persist, right-sided facial droop present,  right lower extremity weakness is improved   Data Review   CBC w Diff:  Lab Results  Component Value Date   WBC 8.0 12/25/2020   HGB 13.6 12/25/2020   HCT 40.0  12/25/2020   PLT 333 12/25/2020   LYMPHOPCT 23 12/25/2020   MONOPCT 7 12/25/2020   EOSPCT 0 12/25/2020   BASOPCT 0 12/25/2020    CMP:  Lab Results  Component Value Date   NA 140 12/25/2020   K 4.0 12/25/2020   CL 105 12/25/2020   CO2 21 (L) 12/25/2020   BUN 18 12/25/2020   CREATININE 0.70 12/25/2020   PROT 8.0 12/25/2020   ALBUMIN 4.4 12/25/2020   BILITOT 0.7 12/25/2020   ALKPHOS 112  12/25/2020   AST 48 (H) 12/25/2020   ALT 37 12/25/2020   Total Discharge time is about 33 minutes  Roxan Hockey M.D on 01/02/2021 at 1:55 PM  Go to [email or web address] -  for contact info  [facility] - Office  [phone]

## 2021-01-02 NOTE — Discharge Instructions (Addendum)
  Inpatient Rehab Discharge Instructions  FABIENNE NOLASCO Discharge date and time: 01/02/2021 12:53 PM   Activities/Precautions/ Functional Status: Activity: As tolerated Diet: Regular Wound Care: Routine skin checks Functional status:  ___ No restrictions     ___ Walk up steps independently ___ 24/7 supervision/assistance   ___ Walk up steps with assistance ___ Intermittent supervision/assistance  ___ Bathe/dress independently ___ Walk with walker     _X__ Bathe/dress with assistance ___ Walk Independently    ___ Shower independently ___ Walk with assistance    ___ Shower with assistance ___ No alcohol     ___ Return to work/school ________  Special Instructions: No driving smoking or alcohol   Aspirin 325 mg daily and Plavix 75 mg daily through 03/20/2021 then aspirin alone  My questions have been answered and I understand these instructions. I will adhere to these goals and the provided educational materials after my discharge from the hospital.  Patient/Caregiver Signature _______________________________ Date __________  Clinician Signature _______________________________________ Date __________  Please bring this form and your medication list with you to all your follow-up doctor's appointments.

## 2021-01-02 NOTE — Discharge Instructions (Addendum)
Inpatient Rehab Discharge Instructions  Natasha Vazquez Discharge date and time: No discharge date for patient encounter.   Activities/Precautions/ Functional Status: Activity: activity as tolerated Diet: Soft Wound Care: Routine skin checks Functional status:  ___ No restrictions     ___ Walk up steps independently ___ 24/7 supervision/assistance   ___ Walk up steps with assistance ___ Intermittent supervision/assistance  ___ Bathe/dress independently ___ Walk with walker     __x_ Bathe/dress with assistance ___ Walk Independently    ___ Shower independently ___ Walk with assistance    ___ Shower with assistance ___ No alcohol     ___ Return to work/school ________  COMMUNITY REFERRALS UPON DISCHARGE:    Outpatient: PT     OT    ST                Agency: [facility] Phone: [phone]              Appointment Date/Time: TBD  Medical Equipment/Items Ordered: Vassie Moselle                                                  Agency/Supplier: [employer]  Special Instructions: No driving smoking or alcohol  Aspirin 325 mg daily and Plavix 75 mg day x3 months then aspirin alone   My questions have been answered and I understand these instructions. I will adhere to these goals and the provided educational materials after my discharge from the hospital.  Patient/Caregiver Signature _______________________________ Date __________  Clinician Signature _______________________________________ Date __________  Please bring this form and your medication list with you to all your follow-up doctor's appointments.  STROKE/TIA DISCHARGE INSTRUCTIONS SMOKING Cigarette smoking nearly doubles your risk of having a stroke & is the single most alterable risk factor  If you smoke or have smoked in the last 12 months, you are advised to quit smoking for your health. Most of the excess cardiovascular risk related to smoking disappears within a year of stopping. Ask you doctor about anti-smoking  medications [place] Quit Line: 1-800-QUIT NOW Free Smoking Cessation Classes [phone]  CHOLESTEROL Know your levels; limit fat & cholesterol in your diet  Lipid Panel     Component Value Date/Time   CHOL 135 12/26/2020 0618   TRIG 102 12/26/2020 0618   HDL 38 (L) 12/26/2020 0618   CHOLHDL 3.6 12/26/2020 0618   VLDL 20 12/26/2020 0618   LDLCALC 77 12/26/2020 0618     Many patients benefit from treatment even if their cholesterol is at goal. Goal: Total Cholesterol (CHOL) less than 160 Goal:  Triglycerides (TRIG) less than 150 Goal:  HDL greater than 40 Goal:  LDL (LDLCALC) less than 100   BLOOD PRESSURE American Stroke Association blood pressure target is less that 120/80 mm/Hg  Your discharge blood pressure is:    Monitor your blood pressure Limit your salt and alcohol intake Many individuals will require more than one medication for high blood pressure  DIABETES (A1c is a blood sugar average for last 3 months) Goal HGBA1c is under 7% (HBGA1c is blood sugar average for last 3 months)  Diabetes: No known diagnosis of diabetes    Lab Results  Component Value Date   HGBA1C 5.6 12/26/2020    Your HGBA1c can be lowered with medications, healthy diet, and exercise. Check your blood sugar as directed by your physician  Call your physician if you experience unexplained or low blood sugars.  PHYSICAL ACTIVITY/REHABILITATION Goal is 30 minutes at least 4 days per week  Activity: Increase activity slowly, Therapies: Physical Therapy: Home Health Return to work:  Activity decreases your risk of heart attack and stroke and makes your heart stronger.  It helps control your weight and blood pressure; helps you relax and can improve your mood. Participate in a regular exercise program. Talk with your doctor about the best form of exercise for you (dancing, walking, swimming, cycling).  DIET/WEIGHT Goal is to maintain a healthy weight  Your discharge diet is:  Diet Order     None        liquids Your height is:    Your current weight is:   Your Body Mass Index (BMI) is:    Following the type of diet specifically designed for you will help prevent another stroke. Your goal weight range is:   Your goal Body Mass Index (BMI) is 19-24. Healthy food habits can help reduce 3 risk factors for stroke:  High cholesterol, hypertension, and excess weight.  RESOURCES Stroke/Support Group:  Call [phone]   STROKE EDUCATION PROVIDED/REVIEWED AND GIVEN TO PATIENT Stroke warning signs and symptoms How to activate emergency medical system (call 911). Medications prescribed at discharge. Need for follow-up after discharge. Personal risk factors for stroke. Pneumonia vaccine given: No Flu vaccine given: No My questions have been answered, the writing is legible, and I understand these instructions.  I will adhere to these goals & educational materials that have been provided to me after my discharge from the hospital.

## 2021-01-03 DIAGNOSIS — I63512 Cerebral infarction due to unspecified occlusion or stenosis of left middle cerebral artery: Secondary | ICD-10-CM | POA: Diagnosis not present

## 2021-01-03 LAB — CBC WITH DIFFERENTIAL/PLATELET
Abs Immature Granulocytes: 0.01 10*3/uL (ref 0.00–0.07)
Basophils Absolute: 0 10*3/uL (ref 0.0–0.1)
Basophils Relative: 1 %
Eosinophils Absolute: 0.1 10*3/uL (ref 0.0–0.5)
Eosinophils Relative: 2 %
HCT: 38.2 % (ref 36.0–46.0)
Hemoglobin: 12.8 g/dL (ref 12.0–15.0)
Immature Granulocytes: 0 %
Lymphocytes Relative: 52 %
Lymphs Abs: 2.1 10*3/uL (ref 0.7–4.0)
MCH: 32.2 pg (ref 26.0–34.0)
MCHC: 33.5 g/dL (ref 30.0–36.0)
MCV: 96 fL (ref 80.0–100.0)
Monocytes Absolute: 0.5 10*3/uL (ref 0.1–1.0)
Monocytes Relative: 12 %
Neutro Abs: 1.4 10*3/uL — ABNORMAL LOW (ref 1.7–7.7)
Neutrophils Relative %: 33 %
Platelets: 339 10*3/uL (ref 150–400)
RBC: 3.98 MIL/uL (ref 3.87–5.11)
RDW: 11.9 % (ref 11.5–15.5)
WBC: 4.1 10*3/uL (ref 4.0–10.5)
nRBC: 0 % (ref 0.0–0.2)

## 2021-01-03 LAB — COMPREHENSIVE METABOLIC PANEL
ALT: 43 U/L (ref 0–44)
AST: 42 U/L — ABNORMAL HIGH (ref 15–41)
Albumin: 3.6 g/dL (ref 3.5–5.0)
Alkaline Phosphatase: 105 U/L (ref 38–126)
Anion gap: 7 (ref 5–15)
BUN: 16 mg/dL (ref 8–23)
CO2: 25 mmol/L (ref 22–32)
Calcium: 9.3 mg/dL (ref 8.9–10.3)
Chloride: 105 mmol/L (ref 98–111)
Creatinine, Ser: 0.87 mg/dL (ref 0.44–1.00)
GFR, Estimated: >60 mL/min (ref >60)
Glucose, Bld: 104 mg/dL — ABNORMAL HIGH (ref 70–99)
Potassium: 4 mmol/L (ref 3.5–5.1)
Sodium: 137 mmol/L (ref 135–145)
Total Bilirubin: 0.7 mg/dL (ref 0.3–1.2)
Total Protein: 7 g/dL (ref 6.5–8.1)
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
## 2021-01-03 NOTE — Evaluation (Signed)
Physical Therapy Assessment and Plan  Patient Details  Name: Natasha Vazquez MRN: [id number] Date of Birth: [birth year]  PT Diagnosis: Abnormality of gait, Coordination disorder, Hemiplegia dominant, and Muscle weakness Rehab Potential: Good ELOS: 10-14 days   Today's Date: 01/03/2021 PT Individual Time: 1302-1400 PT Individual Time Calculation (min): 58 min    Hospital Problem: Principal Problem:   Left middle cerebral artery stroke [facility])   Past Medical History:  Past Medical History:  Diagnosis Date   Hypertension    TMJ (dislocation of temporomandibular joint)    Past Surgical History:  Past Surgical History:  Procedure Laterality Date   ABDOMINAL HYSTERECTOMY     CHOLECYSTECTOMY     IR ANGIO INTRA EXTRACRAN SEL INTERNAL CAROTID BILAT MOD SED  09/16/2016   IR ANGIO VERTEBRAL SEL VERTEBRAL BILAT MOD SED  09/16/2016   MASS EXCISION Left 02/10/2020   Procedure: excision of soft tissue mass left upper back;  Surgeon: Jesusita Oka, MD;  Location: [place];  Service: General;  Laterality: Left;    Assessment & Plan Clinical Impression: Patient is a 70 year old right-handed female with history of hypertension, GI bleed 20 years ago as well as hyperlipidemia.  Per chart review patient lives alone.  Independent household and community ambulator.  She does not drive.  1 level home 4 steps to entry.  Her brother lives next door.  Presented to [facility] 12/25/2020 with right side weakness, aphasia and altered mental status.  Blood pressure was elevated 178/73.  Admission chemistries unremarkable except glucose 147, alcohol negative.  CT angiogram head and neck hypodensity in the posterior left frontal lobe concerning for acute/subacute infarct.  No intracranial large vessel occlusion.  Multifocal narrowing of the right A1 and bilateral distal MCA branches.  No hemodynamically significant stenosis in the neck.  MRI of the brain extensive acute infarcts throughout the left cerebrum,  including left frontal, parietal and temporal lobes and left basal ganglia.  Mild petechial hemorrhage without mass occupying hemorrhagic transformation.  Patient did not receive tPA.  Echocardiogram with ejection fraction of 65 to 70% no regional wall motion abnormalities grade 1 diastolic dysfunction.  Neurology follow-up currently maintained on aspirin 325 mg daily and Plavix 75 mg daily for CVA prophylaxis for 3 months then aspirin alone.  She is currently on a mechanical soft diet with thin liquids.  Therapy evaluations completed due to patient's right side weakness and dysarthric speech was admitted for a comprehensive rehab program Patient transferred to CIR on 01/02/2021 .   Patient currently requires min with mobility secondary to muscle weakness and muscle paralysis, decreased cardiorespiratoy endurance, impaired timing and sequencing, unbalanced muscle activation, and decreased coordination, decreased attention to right, and decreased sitting balance, decreased standing balance, decreased postural control, hemiplegia, and decreased balance strategies.  Prior to hospitalization, patient was independent  with mobility and lived with Alone in a House home.  Home access is 4-5Stairs to enter.  Patient will benefit from skilled PT intervention to maximize safe functional mobility, minimize fall risk, and decrease caregiver burden for planned discharge home with intermittent assist.  Anticipate patient will benefit from follow up [facility] at discharge.  PT - End of Session Activity Tolerance: Tolerates 10 - 20 min activity with multiple rests Endurance Deficit: Yes PT Assessment Rehab Potential (ACUTE/IP ONLY): Good PT Barriers to Discharge: [place] home environment;Decreased caregiver support;Home environment access/layout;Insurance for SNF coverage;Lack of/limited family support;Medication compliance;Nutrition means PT Patient demonstrates impairments in the following area(s):  Balance;Behavior;Endurance;Motor;Nutrition;Perception;Safety;Sensory PT Transfers Functional Problem(s): Bed  Mobility;Bed to Chair;Car;Furniture;Floor PT Locomotion Functional Problem(s): Stairs;Wheelchair Mobility;Ambulation PT Plan PT Intensity: Minimum of 1-2 x/day ,45 to 90 minutes PT Frequency: 5 out of 7 days PT Duration Estimated Length of Stay: 10-14 days PT Treatment/Interventions: Ambulation/gait training;Balance/vestibular training;Cognitive remediation/compensation;Disease management/prevention;Community reintegration;DME/adaptive equipment instruction;Functional mobility training;Functional electrical stimulation;Discharge planning;Patient/family education;Pain management;Neuromuscular re-education;Psychosocial support;Splinting/orthotics;Skin care/wound management;Therapeutic Activities;Therapeutic Exercise;Visual/perceptual remediation/compensation;UE/LE Coordination activities;Stair training;UE/LE Strength taining/ROM;Wheelchair propulsion/positioning PT Transfers Anticipated Outcome(s): Mod I with LRAD PT Locomotion Anticipated Outcome(s): Supervision assist with LRAD ambulatory PT Recommendation Recommendations for Other Services: Therapeutic Recreation consult Therapeutic Recreation Interventions: Stress management;Outing/community reintergration Patient destination: Home Equipment Recommended: To be determined   PT Evaluation Precautions/Restrictions Precautions Precautions: Fall General Chart Reviewed: Yes Family/Caregiver Present: No Vital SignsTherapy Vitals Temp: 98.5 F (36.9 C) Pulse Rate: 77 Resp: 17 BP: 136/74 Patient Position (if appropriate): Lying Oxygen Therapy O2 Device: Room Air Pain Pain Assessment Pain Scale: 0-10 Pain Score: 0-No pain Faces Pain Scale: No hurt Pain Interference Pain Interference Pain Effect on Sleep: 8. Unable to answer Pain Interference with Therapy Activities: 8. Unable to answer Pain Interference with Day-to-Day  Activities: 8. Unable to answer Home Living/Prior Functioning Home Living Available Help at Discharge: Family;Other (Comment);Available PRN/intermittently (Brother lives behind her home; semi connected,) Type of Home: House Home Access: Stairs to enter [employer] of Steps: 4-5 Entrance Stairs-Rails: Left Home Layout: One level;Able to live on main level with bedroom/bathroom Bathroom Shower/Tub: Tub/shower unit Additional Comments: hx from chart. pt perseverative on "no" throughout session  Lives With: Alone Prior Function Vocation Requirements: unable to answ Vision/Perception    Mild R side inattention Cognition Overall Cognitive Status: Difficult to assess (patient with severe expressive aphasia and moderate receptive aphasia and unable to adequately assess) Arousal/Alertness: Awake/alert Orientation Level: Oriented to person Sensation Sensation Light Touch: Impaired Detail Light Touch Impaired Details: Impaired RUE;Impaired RLE Proprioception: Impaired by gross assessment Additional Comments: mild impairments functionally. unable to fully test due to expressive aphasia Coordination Gross Motor Movements are Fluid and Coordinated: No Fine Motor Movements are Fluid and Coordinated: No Coordination and Movement Description: mild dysmetria due to hemiplegia Heel Shin Test: unable to follow instruction Motor  Motor Motor: Hemiplegia Motor - Skilled Clinical Observations: R sided hemiplegia   Trunk/Postural Assessment  Cervical Assessment Cervical Assessment: Within Functional Limits Thoracic Assessment Thoracic Assessment: Within Functional Limits Lumbar Assessment Lumbar Assessment: Within Functional Limits Postural Control Postural Control: Deficits on evaluation (mild instability in standing.)  Balance Standardized Balance Assessment Standardized Balance Assessment: Berg Balance Test;Dynamic Gait Index;PASS;FIST;Timed Up and Go Test Berg Balance Test Sit  to Stand: Able to stand without using hands and stabilize independently Standing Unsupported: Able to stand safely 2 minutes Sitting with Back Unsupported but Feet Supported on Floor or Stool: Able to sit safely and securely 2 minutes Stand to Sit: Controls descent by using hands Transfers: Able to transfer safely, minor use of hands Standing Unsupported with Eyes Closed: Able to stand 10 seconds with supervision Standing Ubsupported with Feet Together: Able to place feet together independently and stand for 1 minute with supervision From Standing, Reach Forward with Outstretched Arm: Can reach forward >12 cm safely (5") From Standing Position, Pick up Object from Floor: Unable to try/needs assist to keep balance From Standing Position, Turn to Look Behind Over each Shoulder: Looks behind from both sides and weight shifts well Turn 360 Degrees: Able to turn 360 degrees safely but slowly Standing Unsupported, Alternately Place Feet on Step/Stool: Able to complete >2 steps/needs minimal assist Standing Unsupported, One Foot  in [place]: Able to take small step independently and hold 30 seconds Standing on One Leg: Tries to lift leg/unable to hold 3 seconds but remains standing independently Total Score: 38 Timed Up and Go Test TUG: Normal TUG Normal TUG (seconds): 22 (average of 3 trials) Extremity Assessment      RLE Assessment RLE Assessment: Exceptions to [facility] General Strength Comments: grossly 4/5 to 4+/5 LLE Assessment LLE Assessment: Within Functional Limits General Strength Comments: grossly 4+/5 to 5/5 functionally  Care Tool Care Tool Bed Mobility Roll left and right activity   Roll left and right assist level: Supervision/Verbal cueing    Sit to lying activity   Sit to lying assist level: Supervision/Verbal cueing    Lying to sitting on side of bed activity   Lying to sitting on side of bed assist level: the ability to move from lying on the back to sitting on the side of the  bed with no back support.: Supervision/Verbal cueing     Care Tool Transfers Sit to stand transfer   Sit to stand assist level: Minimal Assistance - Patient > 75%    Chair/bed transfer   Chair/bed transfer assist level: Minimal Assistance - Patient > 75%     Toilet transfer   Assist Level: Minimal Assistance - Patient > 75%    Car transfer   Car transfer assist level: Minimal Assistance - Patient > 75%      Care Tool Locomotion Ambulation   Assist level: Minimal Assistance - Patient > 75% Assistive device: Hand held assist Max distance: 160  Walk 10 feet activity     Assistive device: Hand held assist   Walk 50 feet with 2 turns activity   Assist level: Minimal Assistance - Patient > 75% Assistive device: Hand held assist  Walk 150 feet activity   Assist level: Minimal Assistance - Patient > 75% Assistive device: Hand held assist  Walk 10 feet on uneven surfaces activity   Assist level: Minimal Assistance - Patient > 75%    Stairs   Assist level: Minimal Assistance - Patient > 75% Stairs assistive device: 1 hand rail Max number of stairs: 12  Walk up/down 1 step activity   Walk up/down 1 step (curb) assist level: Minimal Assistance - Patient > 75%    Walk up/down 4 steps activity   Walk up/down 4 steps assist level: Minimal Assistance - Patient > 75%    Walk up/down 12 steps activity   Walk up/down 12 steps assist level: Minimal Assistance - Patient > 75%    Pick up small objects from floor Pick up small object from the floor (from standing position) activity did not occur: Refused      Wheelchair   Type of Wheelchair: Manual   Wheelchair assist level: Total Assistance - Patient < 25% Max wheelchair distance: 50  Wheel 50 feet with 2 turns activity   Assist Level: Maximal Assistance - Patient 25 - 49%  Wheel 150 feet activity   Assist Level: Total Assistance - Patient < 25%    Refer to Care Plan for Long Term Goals  SHORT TERM GOAL WEEK 1 PT Short Term  Goal 1 (Week 1): Pt will increased Berg to >45 to demonstrate imrpoved safety with fuctional movement PT Short Term Goal 2 (Week 1): Pt will ambulate >120f with CGA PT Short Term Goal 3 (Week 1): Pt will attend to the R side for 75% of session with min cues only PT Short Term Goal 4 (Week 1): Pt  will improved 5xSTS to <15 sec to demonstrate improve safety and balance to reduce fall risk  Recommendations for other services: Therapeutic Recreation  Stress management and Outing/community reintegration  Skilled Therapeutic Intervention Mobility Bed Mobility Bed Mobility: Supine to Sit;Sit to Supine Supine to Sit: Supervision/Verbal cueing Sit to Supine: Supervision/Verbal cueing Transfers Transfers: Sit to Stand;Stand Pivot Transfers Sit to Stand: Minimal Assistance - Patient > 75% Stand Pivot Transfers: Minimal Assistance - Patient > 75% Stand Pivot Transfer Details: Manual facilitation for weight shifting;Verbal cues for sequencing;Verbal cues for technique;Verbal cues for precautions/safety Transfer (Assistive device): None Locomotion  Gait Ambulation: Yes Gait Assistance: Minimal Assistance - Patient > 75% Gait Distance (Feet): 160 Feet Assistive device: 1 person hand held assist Gait Gait: Yes Gait Pattern: Impaired Gait Pattern: Narrow base of support Stairs / Additional Locomotion Stairs: Yes Stairs Assistance: Minimal Assistance - Patient > 75% Stair Management Technique: One rail Right Number of Stairs: 12 Wheelchair Mobility Wheelchair Mobility: Yes Wheelchair Assistance: Maximal Assistance - Patient 25 - 49% Wheelchair Propulsion: Left upper extremity Wheelchair Parts Management: Needs assistance Distance: 53f  Session 1  Pt received supine in bed and agreeable to PT. Supine>sit transfer with supervision assist and cues for safety and attention to the RUE. PT instructed patient in PT Evaluation and initiated treatment intervention; see above for results. PT educated  [place], rehab goals, and discharge recommendations along with recommendation for follow-up rehabilitation services. Gait training with HHA as listed. Patient demonstrates increased fall risk as noted by score of   38/56 on Berg Balance Scale.  (<36= high risk for falls, close to 100%; 37-45 significant >80%; 46-51 moderate >50%; 52-55 lower >25%) Pt performed 5 time sit<>stand (5xSTS): 21 sec (>15 sec indicates increased fall risk)  Car transfer with min assist with cues for sit<>pivot with no adherence to instruction from PT. Pt returned to room and performed stand pivot transfer to bed with min assist. Sit>supine completed with supervision assist with cues for RUE position. and left supine in bed with call bell in reach and all needs met.   Session 2.  Pt eating dinner, answers no at this time to particiapte therapy session. Pt left sitting EOB with call bell in reach and all needs met.      Discharge Criteria: Patient will be discharged from PT if patient refuses treatment 3 consecutive times without medical reason, if treatment goals not met, if there is a change in medical status, if patient makes no progress towards goals or if patient is discharged from hospital.  The above assessment, treatment plan, treatment alternatives and goals were discussed and mutually agreed upon: by patient  ALorie Phenix12/02/2020, 2:04 PM

## 2021-01-03 NOTE — Progress Notes (Signed)
Inpatient Rehabilitation  Patient information reviewed and entered into eRehab system by Syniyah Bourne Kellie Chisolm, OTR/L.   Information including medical coding, functional ability and quality indicators will be reviewed and updated through discharge.    

## 2021-01-03 NOTE — Plan of Care (Signed)
  Problem: RH Balance Goal: LTG Patient will maintain dynamic sitting balance (PT) Description: LTG:  Patient will maintain dynamic sitting balance with assistance during mobility activities (PT) Flowsheets (Taken 01/03/2021 2336) LTG: Pt will maintain dynamic sitting balance during mobility activities with:: Independent Goal: LTG Patient will maintain dynamic standing balance (PT) Description: LTG:  Patient will maintain dynamic standing balance with assistance during mobility activities (PT) Flowsheets (Taken 01/03/2021 2336) LTG: Pt will maintain dynamic standing balance during mobility activities with:: Independent with assistive device    Problem: RH Bed Mobility Goal: LTG Patient will perform bed mobility with assist (PT) Description: LTG: Patient will perform bed mobility with assistance, with/without cues (PT). Flowsheets (Taken 01/03/2021 2336) LTG: Pt will perform bed mobility with assistance level of: Independent with assistive device    Problem: RH Bed to Chair Transfers Goal: LTG Patient will perform bed/chair transfers w/assist (PT) Description: LTG: Patient will perform bed to chair transfers with assistance (PT). Flowsheets (Taken 01/03/2021 2336) LTG: Pt will perform Bed to Chair Transfers with assistance level: Independent with assistive device    Problem: RH Car Transfers Goal: LTG Patient will perform car transfers with assist (PT) Description: LTG: Patient will perform car transfers with assistance (PT). Flowsheets (Taken 01/03/2021 2336) LTG: Pt will perform car transfers with assist:: Set up assist    Problem: RH Ambulation Goal: LTG Patient will ambulate in controlled environment (PT) Description: LTG: Patient will ambulate in a controlled environment, # of feet with assistance (PT). Flowsheets (Taken 01/03/2021 2336) LTG: Pt will ambulate in controlled environ  assist needed:: Supervision/Verbal cueing LTG: Ambulation distance in controlled environment: 248ft with  LRAD Goal: LTG Patient will ambulate in home environment (PT) Description: LTG: Patient will ambulate in home environment, # of feet with assistance (PT). Flowsheets (Taken 01/03/2021 2336) LTG: Pt will ambulate in home environ  assist needed:: Supervision/Verbal cueing LTG: Ambulation distance in home environment: 64ft with LRAD   Problem: RH Stairs Goal: LTG Patient will ambulate up and down stairs w/assist (PT) Description: LTG: Patient will ambulate up and down # of stairs with assistance (PT) Flowsheets (Taken 01/03/2021 2336) LTG: Pt will ambulate up/down stairs assist needed:: Supervision/Verbal cueing LTG: Pt will  ambulate up and down number of stairs: 4 steps with 1 rail

## 2021-01-03 NOTE — Progress Notes (Signed)
Inpatient Rehabilitation Center Individual Statement of Services  Patient Name:  Natasha KESECKER  Date:  01/03/2021  Welcome to the [place].  Our goal is to provide you with an individualized program based on your diagnosis and situation, designed to meet your specific needs.  With this comprehensive rehabilitation program, you will be expected to participate in at least 3 hours of rehabilitation therapies Monday-Friday, with modified therapy programming on the weekends.  Your rehabilitation program will include the following services:  Physical Therapy (PT), Occupational Therapy (OT), Speech Therapy (ST), 24 hour per day rehabilitation nursing, Therapeutic Recreaction (TR), Neuropsychology, Care Coordinator, Rehabilitation Medicine, Nutrition Services, Pharmacy Services, and Other  Weekly team conferences will be held on Wednesdays to discuss your progress.  Your Inpatient Rehabilitation Care Coordinator will talk with you frequently to get your input and to update you on team discussions.  Team conferences with you and your family in attendance may also be held.  Expected length of stay:  7-10 Days  Overall anticipated outcome:  Supervision to Min A  Depending on your progress and recovery, your program may change. Your Inpatient Rehabilitation Care Coordinator will coordinate services and will keep you informed of any changes. Your Inpatient Rehabilitation Care Coordinator's name and contact numbers are listed  below.  The following services may also be recommended but are not provided by the [facility]:   [place] will be made to provide these services after discharge if needed.  Arrangements include referral to agencies that provide these services.  Your insurance has been verified to be:  [facility] Medicare Your primary doctor is:  Carol Ada, MD  Pertinent  information will be shared with your doctor and your insurance company.  Inpatient Rehabilitation Care Coordinator:  Erlene Quan, [place] or [phone]  Information discussed with and copy given to patient by: [name], 01/03/2021, 2:39 PM

## 2021-01-03 NOTE — Plan of Care (Signed)
Problem: RH Balance Goal: LTG Patient will maintain dynamic standing with ADLs (OT) Description: LTG:  Patient will maintain dynamic standing balance with assist during activities of daily living (OT)  Flowsheets (Taken 01/03/2021 1004) LTG: Pt will maintain dynamic standing balance during ADLs with: Contact Guard/Touching assist   Problem: Sit to Stand Goal: LTG:  Patient will perform sit to stand in prep for activites of daily living with assistance level (OT) Description: LTG:  Patient will perform sit to stand in prep for activites of daily living with assistance level (OT) Flowsheets (Taken 01/03/2021 1004) LTG: PT will perform sit to stand in prep for activites of daily living with assistance level: Supervision/Verbal cueing   Problem: RH Grooming Goal: LTG Patient will perform grooming w/assist,cues/equip (OT) Description: LTG: Patient will perform grooming with assist, with/without cues using equipment (OT) Flowsheets (Taken 01/03/2021 1004) LTG: Pt will perform grooming with assistance level of: Supervision/Verbal cueing   Problem: RH Bathing Goal: LTG Patient will bathe all body parts with assist levels (OT) Description: LTG: Patient will bathe all body parts with assist levels (OT) Flowsheets (Taken 01/03/2021 1004) LTG: Pt will perform bathing with assistance level/cueing: Contact Guard/Touching assist   Problem: RH Dressing Goal: LTG Patient will perform upper body dressing (OT) Description: LTG Patient will perform upper body dressing with assist, with/without cues (OT). Flowsheets (Taken 01/03/2021 1004) LTG: Pt will perform upper body dressing with assistance level of: Supervision/Verbal cueing Goal: LTG Patient will perform lower body dressing w/assist (OT) Description: LTG: Patient will perform lower body dressing with assist, with/without cues in positioning using equipment (OT) Flowsheets (Taken 01/03/2021 1004) LTG: Pt will perform lower body dressing with assistance  level of: Contact Guard/Touching assist   Problem: RH Toileting Goal: LTG Patient will perform toileting task (3/3 steps) with assistance level (OT) Description: LTG: Patient will perform toileting task (3/3 steps) with assistance level (OT)  Flowsheets (Taken 01/03/2021 1004) LTG: Pt will perform toileting task (3/3 steps) with assistance level: Contact Guard/Touching assist   Problem: RH Functional Use of Upper Extremity Goal: LTG Patient will use RT/LT upper extremity as a (OT) Description: LTG: Patient will use right/left upper extremity as a stabilizer/gross assist/diminished/nondominant/dominant level with assist, with/without cues during functional activity (OT) Flowsheets (Taken 01/03/2021 1004) LTG: Use of upper extremity in functional activities: RUE as a stabilizer LTG: Pt will use upper extremity in functional activity with assistance level of: Minimal Assistance - Patient > 75%   Problem: RH Light Housekeeping Goal: LTG Patient will perform light housekeeping w/assist (OT) Description: LTG: Patient will perform light housekeeping with assistance, with/without cues (OT). Flowsheets (Taken 01/03/2021 1004) LTG: Pt will perform light housekeeping with assistance level of: Contact Guard/Touching assist   Problem: RH Toilet Transfers Goal: LTG Patient will perform toilet transfers w/assist (OT) Description: LTG: Patient will perform toilet transfers with assist, with/without cues using equipment (OT) Flowsheets (Taken 01/03/2021 1004) LTG: Pt will perform toilet transfers with assistance level of: Contact Guard/Touching assist   Problem: RH Tub/Shower Transfers Goal: LTG Patient will perform tub/shower transfers w/assist (OT) Description: LTG: Patient will perform tub/shower transfers with assist, with/without cues using equipment (OT) Flowsheets (Taken 01/03/2021 1004) LTG: Pt will perform tub/shower stall transfers with assistance level of: Contact Guard/Touching assist    Problem: RH Attention Goal: LTG Patient will demonstrate this level of attention during functional activites (OT) Description: LTG:  Patient will demonstrate this level of attention during functional activites  (OT) Flowsheets (Taken 01/03/2021 1004) Patient will demonstrate this level of attention  during functional activites: Selective Patient will demonstrate above attention level in the following environment: Community LTG: Patient will demonstrate this level of attention during functional activites (OT): Minimal Assistance - Patient > 75%

## 2021-01-03 NOTE — Evaluation (Signed)
Speech Language Pathology Assessment and Plan  Patient Details  Name: Natasha Vazquez MRN: [id number] Date of Birth: 1950/10/09  SLP Diagnosis: Aphasia;Dysarthria;Dysphagia;Apraxia  Rehab Potential: Good ELOS: 2-3 weeks    Today's Date: 01/03/2021 SLP Individual Time: 0800-0900 SLP Individual Time Calculation (min): 60 min   Hospital Problem: Principal Problem:   Left middle cerebral artery stroke [facility])  Past Medical History:  Past Medical History:  Diagnosis Date   Hypertension    TMJ (dislocation of temporomandibular joint)    Past Surgical History:  Past Surgical History:  Procedure Laterality Date   ABDOMINAL HYSTERECTOMY     CHOLECYSTECTOMY     IR ANGIO INTRA EXTRACRAN SEL INTERNAL CAROTID BILAT MOD SED  09/16/2016   IR ANGIO VERTEBRAL SEL VERTEBRAL BILAT MOD SED  09/16/2016   MASS EXCISION Left 02/10/2020   Procedure: excision of soft tissue mass left upper back;  Surgeon: Jesusita Oka, MD;  Location: [place];  Service: General;  Laterality: Left;    Assessment / Plan / Recommendation Natasha Vazquez is a 70 year old right-handed female with history of hypertension, GI bleed 20 years ago as well as hyperlipidemia.  Per chart review patient lives alone.  Independent household and community ambulator.  She does not drive.  Her brother lives next door.  Presented to [facility] 12/25/2020 with right side weakness, aphasia and altered mental status. Admission chemistries unremarkable except glucose 147, alcohol negative.  CT angiogram head and neck hypodensity in the posterior left frontal lobe concerning for acute/subacute infarct. MRI of the brain extensive acute infarcts throughout the left cerebrum, including left frontal, parietal and temporal lobes and left basal ganglia.  Mild petechial hemorrhage without mass occupying hemorrhagic transformation.  She is currently on a mechanical soft diet with thin liquids.  Therapy evaluations completed due to patient's right  side weakness and dysarthric speech was admitted for a comprehensive rehab program. Currently unable to communicate other than saying "no."   Clinical Impression Patient presents with severely impaired expressive aphasia and a moderately impaired receptive aphasia. In addition, she exhibits a moderate flaccid dysarthria and mild-moderate oropharyngeal dysphagia. Patient spontaneously says "no" and during automatic speech tasks, would just say "no" but would vary intonation somewhat when SLP counting and saying ABC's. She was not able to imitate even at phoneme level to She pointed to objects in field of two correctly with 80% accuracy but declined to 60% with field of three. She matched objects to printed word in field of two with 50% accurac. She was able to follow one step commands however this was impacted by her perseveration and suspeted apraxia and therefore was highly inconsistent. Patient was able to perform actions of holding cup to take sips, brushing teeth after SLP setup of materials. No overt s/s aspiration or penetration observed however patient did have right sided oral residuals from breakfast meal that presented when completing oral care. Patient will benefit from skilled SLP intervention to maximize her ability to communicate wants/needs via multimodal communcation, safely consume least restrictive oral diet and to demonstrate ability to comprehend and perform basic level functional tasks.  Skilled Therapeutic Interventions          SLE, BSE  SLP Assessment  Patient will need skilled Speech Lanaguage Pathology Services during CIR admission    Recommendations  SLP Diet Recommendations: Dysphagia 3 (Mech soft);Thin Liquid Administration via: Cup;Straw Medication Administration: Whole meds with puree Supervision: Intermittent supervision to cue for compensatory strategies;Patient able to self feed Compensations: Minimize environmental distractions;Slow rate;Small  sips/bites;Lingual sweep  for clearance of pocketing Postural Changes and/or Swallow Maneuvers: Seated upright 90 degrees Oral Care Recommendations: Oral care BID Patient destination: Home Follow up Recommendations: Home Health SLP;24 hour supervision/assistance;Outpatient SLP Equipment Recommended: None recommended by SLP    SLP Frequency 3 to 5 out of 7 days   SLP Duration  SLP Intensity  SLP Treatment/Interventions 2-3 weeks  Minumum of 1-2 x/day, 30 to 90 minutes  Dysphagia/aspiration precaution training;Speech/Language facilitation;Environmental controls;Cueing hierarchy;Oral motor exercises;Functional tasks;Multimodal communication approach;Patient/family education    Pain Pain Assessment Pain Scale: 0-10 Pain Score: 0-No pain Faces Pain Scale: No hurt  Prior Functioning Cognitive/Linguistic Baseline: Within functional limits (per previous eval, documented that 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) Type of Home: House  Lives With: Alone Available Help at Discharge: Family;Other (Comment);Available PRN/intermittently (Brother lives behind her home; semi connected,)  SLP Evaluation Cognition Overall Cognitive Status: Difficult to assess (unable to fully assess 2/2 global aphasia) Arousal/Alertness: Awake/alert Orientation Level: Oriented to person Year: Other (Comment) (unable to state 2/2 global aphasia) Month:  (unable to state 2/2 global aphasia) Day of Week: Other (Comment) (unable to state 2/2 global aphasia) Memory:  (Unable to fully assess 2/2 global aphasia) Immediate Memory Recall:  (Unable to fully assess 2/2 global aphasia) Memory Recall Sock:  (Unable to fully assess 2/2 global aphasia) Memory Recall Blue:  (Unable to fully assess 2/2 global aphasia) Memory Recall Bed:  (Unable to fully assess 2/2 global aphasia) Problem Solving: Impaired Behaviors: Perseveration;Impulsive Safety/Judgment: Impaired  Comprehension Auditory Comprehension Overall Auditory Comprehension:  Impaired Yes/No Questions: Impaired Basic Biographical Questions: 26-50% accurate Basic Immediate Environment Questions: 0-24% accurate Commands: Impaired One Step Basic Commands: 25-49% accurate Interfering Components: Motor planning EffectiveTechniques: Visual/Gestural cues Visual Recognition/Discrimination Common Objects: Able in field of 2 Reading Comprehension Reading Status: Impaired Word level: Impaired Sentence Level: Not tested Paragraph Level: Not tested Functional Environmental (signs, name badge): Not tested Interfering Components: Processing time;Visual scanning Effective Techniques: Verbal cueing;Visual cueing Expression Expression Primary Mode of Expression: Verbal Verbal Expression Overall Verbal Expression: Impaired Initiation: Impaired Automatic Speech:  ("no" (not meaning 'no')) Level of Generative/Spontaneous Verbalization: Word Repetition: Impaired Level of Impairment: Word level Naming: Impairment Responsive: 0-25% accurate (0%) Confrontation: Impaired Common Objects: Able in field of 2 Verbal Errors: Perseveration Pragmatics: No impairment Non-Verbal Means of Communication: Not applicable Written Expression Dominant Hand: Right Written Expression: Not tested Oral Motor Oral Motor/Sensory Function Overall Oral Motor/Sensory Function: Moderate impairment Facial ROM: Reduced right;Suspected CN VII (facial) dysfunction Facial Symmetry: Abnormal symmetry right;Suspected CN VII (facial) dysfunction Facial Strength: Reduced right Facial Sensation: Reduced right Lingual ROM: Within Functional Limits Lingual Symmetry: Within Functional Limits Lingual Strength: Within Functional Limits Lingual Sensation: Within Functional Limits Mandible: Within Functional Limits Motor Speech Overall Motor Speech: Impaired Respiration: Within functional limits Phonation: Normal Resonance: Within functional limits Articulation: Impaired Level of Impairment:  Word Intelligibility: Intelligibility reduced Word: Other (comment) (only word said was "no") Motor Planning: Impaired Level of Impairment: Word Motor Speech Errors: Unaware  Care Tool Care Tool Cognition Ability to hear (with hearing aid or hearing appliances if normally used Ability to hear (with hearing aid or hearing appliances if normally used): 1. Minimal difficulty - difficulty in some environments (e.g. when person speaks softly or setting is noisy)   Expression of Ideas and Wants Expression of Ideas and Wants: 1. Rarely/Never expressess or very difficult - rarely/never expresses self or speech is very difficult to understand   Understanding Verbal and Non-Verbal Content Understanding Verbal and Non-Verbal Content: 2. Sometimes  understands - understands only basic conversations or simple, direct phrases. Frequently requires cues to understand  Memory/Recall Ability Memory/Recall Ability : None of the above were recalled    Intelligibility: Intelligibility reduced Word: Other (comment) (only word said was "no")  Bedside Swallowing Assessment General Date of Onset: 12/25/20 Previous Swallow Assessment: 12/26/20 Diet Prior to this Study: Dysphagia 3 (soft);Thin liquids Temperature Spikes Noted: No Respiratory Status: Room air History of Recent Intubation: No Behavior/Cognition: Cooperative;Pleasant mood;Alert Oral Cavity - Dentition: Adequate natural dentition;Missing dentition Self-Feeding Abilities: Able to feed self Vision: Functional for self-feeding Patient Positioning: Upright in bed Baseline Vocal Quality: Normal Volitional Cough: Cognitively unable to elicit Volitional Swallow: Unable to elicit  Oral Care Assessment   Ice Chips Ice chips: Not tested Thin Liquid Thin Liquid: Impaired Presentation: Straw;Self Fed Oral Phase Impairments: Reduced labial seal Other Comments: No overt s/s aspiration or penetration with straw sips of thin liquids Nectar Thick    Honey Thick   Puree Puree: Not tested Solid Solid: Impaired Oral Phase Impairments: Reduced labial seal Oral Phase Functional Implications: Right lateral sulci pocketing;Oral residue BSE Assessment Risk for Aspiration Impact on safety and function: Mild aspiration risk  Short Term Goals: Week 1: SLP Short Term Goal 1 (Week 1): Patient will tolerate trials of regular texture solids with min-modA cues for awareness to and clearing of oral residuals. SLP Short Term Goal 2 (Week 1): Patient will point to objects and object pictures/photos when named in field of 3 with 80% accuracy and minA cues. SLP Short Term Goal 3 (Week 1): Patient will follow one-step basic level verbal directions with modA cues to redirect from perseverations. SLP Short Term Goal 4 (Week 1): Patient will imitate to perform gestures/signs to indicate basic wants/needs with maxA verbal, visual and tactile cues. SLP Short Term Goal 5 (Week 1): Patient will imitate during automatic speech tasks (ABC's, counting, singing, etc) with maxA cues (visual, verbal).  Refer to Care Plan for Long Term Goals  Recommendations for other services: None   Discharge Criteria: Patient will be discharged from SLP if patient refuses treatment 3 consecutive times without medical reason, if treatment goals not met, if there is a change in medical status, if patient makes no progress towards goals or if patient is discharged from hospital.  The above assessment, treatment plan, treatment alternatives and goals were discussed and mutually agreed upon: No family available/patient unable   Sonia Baller, MA, CCC-SLP Speech Therapy

## 2021-01-03 NOTE — Progress Notes (Signed)
PROGRESS NOTE   Subjective/Complaints:    Objective:   No results found. Recent Labs    01/03/21 0501  WBC 4.1  HGB 12.8  HCT 38.2  PLT 339   Recent Labs    01/03/21 0501  NA 137  K 4.0  CL 105  CO2 25  GLUCOSE 104*  BUN 16  CREATININE 0.87  CALCIUM 9.3    Intake/Output Summary (Last 24 hours) at 01/03/2021 2536 Last data filed at 01/02/2021 1700 Gross per 24 hour  Intake 0 ml  Output --  Net 0 ml        Physical Exam: Vital Signs Blood pressure 125/77, pulse 73, temperature 97.6 F (36.4 C), temperature source Oral, resp. rate 16, height 5\' 8"  (1.727 m), weight 70.9 kg, SpO2 98 %.  General: No acute distress Mood and affect are appropriate Heart: Regular rate and rhythm no rubs murmurs or extra sounds Lungs: Clear to auscultation, breathing unlabored, no rales or wheezes Abdomen: Positive bowel sounds, soft nontender to palpation, nondistended Extremities: No clubbing, cyanosis, or edema Skin: No evidence of breakdown, no evidence of rash Neurologic: RIght central 7, needs gestural cues for MMT motor strength is 5/5 in left deltoid, bicep, tricep, grip, hip flexor, knee extensors, ankle dorsiflexor and plantar flexor 0/5 RUE, 4/5 RLE- perseverative  Sensory exam unable to assess due to aphasia Unable to state name needs gestural cues to follow commands, answers all questions with "no"   Musculoskeletal: Full range of motion in all 4 extremities. No joint swelling    Assessment/Plan: 1. Functional deficits which require 3+ hours per day of interdisciplinary therapy in a comprehensive inpatient rehab setting. Physiatrist is providing close team supervision and 24 hour management of active medical problems listed below. Physiatrist and rehab team continue to assess barriers to discharge/monitor patient progress toward functional and medical goals  Care Tool:  Bathing              Bathing  assist       Upper Body Dressing/Undressing Upper body dressing        Upper body assist      Lower Body Dressing/Undressing Lower body dressing            Lower body assist       Toileting Toileting    Toileting assist       Transfers Chair/bed transfer  Transfers assist           Locomotion Ambulation   Ambulation assist              Walk 10 feet activity   Assist           Walk 50 feet activity   Assist           Walk 150 feet activity   Assist           Walk 10 feet on uneven surface  activity   Assist           Wheelchair     Assist               Wheelchair 50 feet with 2 turns activity    Assist  Wheelchair 150 feet activity     Assist          Blood pressure 125/77, pulse 73, temperature 97.6 F (36.4 C), temperature source Oral, resp. rate 16, height 5\' 8"  (1.727 m), weight 70.9 kg, SpO2 98 %.  Medical Problem List and Plan: 1.  Right side weakness with aphasia functional deficits secondary to left MCA infarction including Left frontal , parietal and temporal lobes             -patient may shower             -ELOS/Goals: 7-10 days S             Admit to [facility] today , per OT apraxic but responds to gestural cues 2.  Antithrombotics: -DVT/anticoagulation:  Mechanical: Antiembolism stockings, thigh (TED hose) Bilateral lower extremities             -antiplatelet therapy: Aspirin 325 mg daily and Plavix 75 mg daily x3 months then aspirin alone 3. Pain Management: Tylenol as needed 4. Mood: Provide emotional support             -antipsychotic agents: N/A 5. Neuropsych: This patient is not capable of making decisions on her own behalf. 6. Skin/Wound Care: Routine skin checks 7. Fluids/Electrolytes/Nutrition: Routine in and outs with follow-up chemistries 8.  Hypertension.  Continue Norvasc 2.5 mg daily.Her Cozaar 100 mg daily was held due to blood pressure being soft  and resume as needed.  Monitor with increased mobility Vitals:   01/02/21 1929 01/03/21 0529  BP: (!) 119/106 125/77  Pulse: 86 73  Resp:  16  Temp: 98.3 F (36.8 C) 97.6 F (36.4 C)  SpO2: 98% 98%  Elevated diastolic yesterday normal this am   9.  Hyperlipidemia.  Continue Crestor 10.  History of remote GI bleed 20 years ago.  Continue Protonix.    LOS: 1 days A FACE TO FACE EVALUATION WAS PERFORMED  Charlett Blake 01/03/2021, 9:09 AM

## 2021-01-04 DIAGNOSIS — I63512 Cerebral infarction due to unspecified occlusion or stenosis of left middle cerebral artery: Secondary | ICD-10-CM | POA: Diagnosis not present

## 2021-01-04 NOTE — Progress Notes (Signed)
PROGRESS NOTE   Subjective/Complaints: Pt aphasic with difficulty following commands, responds  ROS limited due to aphasia    Objective:   No results found. Recent Labs    01/03/21 0501  WBC 4.1  HGB 12.8  HCT 38.2  PLT 339    Recent Labs    01/03/21 0501  NA 137  K 4.0  CL 105  CO2 25  GLUCOSE 104*  BUN 16  CREATININE 0.87  CALCIUM 9.3     Intake/Output Summary (Last 24 hours) at 01/04/2021 0846 Last data filed at 01/04/2021 0805 Gross per 24 hour  Intake 360 ml  Output --  Net 360 ml         Physical Exam: Vital Signs Blood pressure (!) 113/93, pulse 74, temperature 97.7 F (36.5 C), resp. rate 16, height 5\' 8"  (1.727 m), weight 70.9 kg, SpO2 98 %.  General: No acute distress Mood and affect are appropriate Heart: Regular rate and rhythm no rubs murmurs or extra sounds Lungs: Clear to auscultation, breathing unlabored, no rales or wheezes Abdomen: Positive bowel sounds, soft nontender to palpation, nondistended Extremities: No clubbing, cyanosis, or edema Skin: No evidence of breakdown, no evidence of rash   Neurologic: RIght central 7, needs gestural cues for MMT motor strength is difficult to eval due to receptive deficits and motor apraxia  Spontaneously lifts Right arm  Sensory exam unable to assess due to aphasia Unable to state name needs gestural cues to follow commands, answers all questions with "no"   Musculoskeletal: Full range of motion in all 4 extremities. No joint swelling    Assessment/Plan: 1. Functional deficits which require 3+ hours per day of interdisciplinary therapy in a comprehensive inpatient rehab setting. Physiatrist is providing close team supervision and 24 hour management of active medical problems listed below. Physiatrist and rehab team continue to assess barriers to discharge/monitor patient progress toward functional and medical goals  Care  Tool:  Bathing    Body parts bathed by patient: Right arm, Chest, Abdomen, Front perineal area, Right upper leg, Left upper leg, Right lower leg, Left lower leg, Face   Body parts bathed by helper: Left arm, Buttocks     Bathing assist Assist Level: Minimal Assistance - Patient > 75%     Upper Body Dressing/Undressing Upper body dressing   What is the patient wearing?: Button up shirt    Upper body assist Assist Level: Moderate Assistance - Patient 50 - 74%    Lower Body Dressing/Undressing Lower body dressing      What is the patient wearing?: Pants     Lower body assist Assist for lower body dressing: Minimal Assistance - Patient > 75%     Toileting Toileting    Toileting assist Assist for toileting: Minimal Assistance - Patient > 75%     Transfers Chair/bed transfer  Transfers assist     Chair/bed transfer assist level: Minimal Assistance - Patient > 75%     Locomotion Ambulation   Ambulation assist      Assist level: Minimal Assistance - Patient > 75% Assistive device: Hand held assist Max distance: 160   Walk 10 feet activity   Assist  Assistive device: Hand held assist   Walk 50 feet activity   Assist    Assist level: Minimal Assistance - Patient > 75% Assistive device: Hand held assist    Walk 150 feet activity   Assist    Assist level: Minimal Assistance - Patient > 75% Assistive device: Hand held assist    Walk 10 feet on uneven surface  activity   Assist     Assist level: Minimal Assistance - Patient > 75%     Wheelchair     Assist   Type of Wheelchair: Manual    Wheelchair assist level: Total Assistance - Patient < 25% Max wheelchair distance: 50    Wheelchair 50 feet with 2 turns activity    Assist        Assist Level: Maximal Assistance - Patient 25 - 49%   Wheelchair 150 feet activity     Assist      Assist Level: Total Assistance - Patient < 25%   Blood pressure (!)  113/93, pulse 74, temperature 97.7 F (36.5 C), resp. rate 16, height 5\' 8"  (1.727 m), weight 70.9 kg, SpO2 98 %.  Medical Problem List and Plan: 1.  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  Antithrombotics: -DVT/anticoagulation:  Mechanical: Antiembolism stockings, thigh (TED hose) Bilateral lower extremities             -antiplatelet therapy: Aspirin 325 mg daily and Plavix 75 mg daily x3 months then aspirin alone 3. Pain Management: Tylenol as needed 4. Mood: Provide emotional support             -antipsychotic agents: N/A 5. Neuropsych: This patient is not capable of making decisions on her own behalf. 6. Skin/Wound Care: Routine skin checks 7. Fluids/Electrolytes/Nutrition: Routine in and outs with follow-up chemistries 8.  Hypertension.  Continue Norvasc 2.5 mg daily.Her Cozaar 100 mg daily was held due to blood pressure being soft and resume as needed.  Monitor with increased mobility Vitals:   01/03/21 2008 01/04/21 0549  BP: 139/82 (!) 113/93  Pulse: 75 74  Resp: 16 16  Temp: 98.4 F (36.9 C) 97.7 F (36.5 C)  SpO2: 100% 98%  Controlled   9.  Hyperlipidemia.  Continue Crestor 10.  History of remote GI bleed 20 years ago.  Continue Protonix.    LOS: 2 days A FACE TO FACE EVALUATION WAS PERFORMED  Charlett Blake 01/04/2021, 8:46 AM
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
## 2021-01-04 NOTE — Progress Notes (Signed)
Speech Language Pathology Daily Session Note  Patient Details  Name: Natasha Vazquez MRN: [id number] Date of Birth: 1950/11/14  Today's Date: 01/04/2021 SLP Individual Time: 1430-1530 SLP Individual Time Calculation (min): 60 min  Short Term Goals: Week 1: SLP Short Term Goal 1 (Week 1): Patient will tolerate trials of regular texture solids with min-modA cues for awareness to and clearing of oral residuals. SLP Short Term Goal 2 (Week 1): Patient will point to objects and object pictures/photos when named in field of 3 with 80% accuracy and minA cues. SLP Short Term Goal 3 (Week 1): Patient will follow one-step basic level verbal directions with modA cues to redirect from perseverations. SLP Short Term Goal 4 (Week 1): Patient will imitate to perform gestures/signs to indicate basic wants/needs with maxA verbal, visual and tactile cues. SLP Short Term Goal 5 (Week 1): Patient will imitate during automatic speech tasks (ABC's, counting, singing, etc) with maxA cues (visual, verbal).  Skilled Therapeutic Interventions: Skilled ST treatment focused on communication goals. SLP facilitated session by providing max A multimodal cues for performing gestures in response to yes/no questions and communicate functional needs. Pt nodded her head accurately on command during 50% of occasions with max A cues. She performed head nods/gestures <25% of occasions in response to yes/no questions. She exhibited difficulty executing thumbs up/down gesture and often pointed upward with index finger for "yes" though would sometimes do the same for "no." Pt responded verbally and/or gestures to yes/no questions with approximately 25% accuracy and following the need for extensive clarification. Although majority of verbal responses were "no", she elicited "yeah", "uh-huh", and "mhh-hmm" during several occasions. When pt attempted to communicate in an open ended manner she exhibited substantial difficulty and persevered on  "that one" and "this and that" while pointing to the wall. SLP also facilitated automatic speech tasks with max A multimodal cues including counting 1-10 to achieve 40% accuracy following several effortful attempts, 0% accuracy for days of the week, and elicited a few words for singing "happy birthday", "Jingle Bells" and "we wish you a merry christmas." Pt requested to get into her recliner to watch television by pointing to chair and TV at end of session. Patient was left in recliner with alarm activated and immediate needs within reach at end of session. Continue per current plan of care.      Pain Pain Assessment Pain Scale: 0-10 Pain Score: 0-No pain  Therapy/Group: Individual Therapy  Patty Sermons 01/04/2021, 2:46 PM

## 2021-01-04 NOTE — IPOC Note (Signed)
Overall Plan of Care [facility]) Patient Details Name: Natasha Vazquez MRN: [id number] DOB: March 14, 1950  Admitting Diagnosis: Left middle cerebral artery stroke [facility])  Hospital Problems: Principal Problem:   Left middle cerebral artery stroke [facility])     Functional Problem List: Nursing Medication Management, Safety, Endurance, Nutrition, Bowel, Pain  PT Balance, Behavior, Endurance, Motor, Nutrition, Perception, Safety, Sensory  OT Cognition, Balance, Vision, Endurance, Motor, Perception, Safety, Skin Integrity, Sensory  SLP Linguistic, Motor, Cognition, Behavior, Perception, Safety  TR         Basic ADL's: OT Eating, Grooming, Bathing, Dressing, Toileting     Advanced  ADL's: OT Light Housekeeping     Transfers: PT Bed Mobility, Bed to Chair, Car, [employer], Futures trader, Tub/Shower     Locomotion: PT Stairs, Emergency planning/management officer, Ambulation     Additional Impairments: OT Fuctional Use of Upper Extremity  SLP Swallowing, Communication comprehension, expression    TR      Anticipated Outcomes Item Anticipated Outcome  Self Feeding S  Swallowing  supervisionA   Basic self-care  S to CGA  Toileting  CGA   Bathroom Transfers CGA  Bowel/Bladder  manage bowel w mod I assist  Transfers  Mod I with LRAD  Locomotion  Supervision assist with LRAD ambulatory  Communication  modA expressive (multimodal), minA basic receptive language  Cognition  supervision safety  Pain  pain at or below level 4  Safety/Judgment  maintain safety w cues   Therapy Plan: PT Intensity: Minimum of 1-2 x/day ,45 to 90 minutes PT Frequency: 5 out of 7 days PT Duration Estimated Length of Stay: 10-14 days OT Intensity: Minimum of 1-2 x/day, 45 to 90 minutes OT Frequency: 5 out of 7 days OT Duration/Estimated Length of Stay: 1.5 to 2 weeks SLP Intensity: Minumum of 1-2 x/day, 30 to 90 minutes SLP Frequency: 3 to 5 out of 7 days SLP Duration/Estimated Length of Stay: 2-3 weeks    Due to the current state of emergency, patients may not be receiving their 3-hours of Medicare-mandated therapy.   Team Interventions: Nursing Interventions Disease Management/Prevention, Medication Management, Discharge Planning, Bowel Management, Patient/Family Education, Dysphagia/Aspiration Precaution Training  PT interventions Ambulation/gait training, Training and development officer, Cognitive remediation/compensation, Disease management/prevention, Community reintegration, DME/adaptive equipment instruction, Functional mobility training, Functional electrical stimulation, Discharge planning, Patient/family education, Pain management, Neuromuscular re-education, Psychosocial support, Splinting/orthotics, Skin care/wound management, Therapeutic Activities, Therapeutic Exercise, Visual/perceptual remediation/compensation, UE/LE Coordination activities, Stair training, UE/LE Strength taining/ROM, Wheelchair propulsion/positioning  OT Interventions Cognitive remediation/compensation, Training and development officer, Disease mangement/prevention, Neuromuscular re-education, Self Care/advanced ADL retraining, Therapeutic Exercise, Wheelchair propulsion/positioning, UE/LE Strength taining/ROM, Pain management, DME/adaptive equipment instruction, Community reintegration, Technical sales engineer stimulation, Patient/family education, Splinting/orthotics, UE/LE Coordination activities, Visual/perceptual remediation/compensation, Therapeutic Activities, Psychosocial support, Functional mobility training, Discharge planning, Skin care/wound managment  SLP Interventions Dysphagia/aspiration precaution training, Speech/Language facilitation, Environmental controls, Cueing hierarchy, Oral motor exercises, Functional tasks, Multimodal communication approach, Patient/family education  TR Interventions    SW/CM Interventions Discharge Planning, Psychosocial Support, Patient/Family Education, Disease Management/Prevention    Barriers to Discharge MD  Medical stability and communication  Nursing Decreased caregiver support, Home environment access/layout 1 level 4 ste left rail; daughter, sister and brother will assist at discharge  PT Inaccessible home environment, Decreased caregiver support, Home environment Child psychotherapist, Insurance for SNF coverage, Lack of/limited family support, Medication compliance, Nutrition means    OT Decreased caregiver support, Home environment Child psychotherapist, Insurance for SNF coverage    SLP Decreased caregiver support patient premorbidly lives alone  SW Insurance for SNF coverage  Team Discharge Planning: Destination: PT-Home ,OT- Home , SLP-Home Projected Follow-up: PT- , OT-  Home health OT, 24 hour supervision/assistance, SLP-Home Health SLP, 24 hour supervision/assistance, Outpatient SLP Projected Equipment Needs: PT-To be determined, OT- To be determined, SLP-None recommended by SLP Equipment Details: PT- , OT-  Patient/family involved in discharge planning: PT- Patient,  OT-Patient unable/family or caregiver not available, SLP-Patient unable/family or caregive not available  MD ELOS: 10-14d Medical Rehab Prognosis:  Good Assessment: 70 year old right-handed female with history of hypertension, GI bleed 20 years ago as well as hyperlipidemia.  Per chart review patient lives alone.  Independent household and community ambulator.  She does not drive.  1 level home 4 steps to entry.  Her brother lives next door.  Presented to [facility]/25/2020 with right side weakness, aphasia and altered mental status.  Blood pressure was elevated 178/73.  Admission chemistries unremarkable except glucose 147, alcohol negative.  CT angiogram head and neck hypodensity in the posterior left frontal lobe concerning for acute/subacute infarct.  No intracranial large vessel occlusion.  Multifocal narrowing of the right A1 and bilateral distal MCA branches.  No hemodynamically significant stenosis in  the neck.  MRI of the brain extensive acute infarcts throughout the left cerebrum, including left frontal, parietal and temporal lobes and left basal ganglia.  Mild petechial hemorrhage without mass occupying hemorrhagic transformation.  Patient did not receive tPA.  Echocardiogram with ejection fraction of 65 to 70% no regional wall motion abnormalities grade 1 diastolic dysfunction.  Neurology follow-up currently maintained on aspirin 325 mg daily and Plavix 75 mg daily for CVA prophylaxis for 3 months then aspirin alone.  She is currently on a mechanical soft diet with thin liquids.  Therapy evaluations completed due to patient's right side weakness and dysarthric speech was admitted for a comprehensive rehab program. Currently unable to communicate other than saying "no."    See Team Conference Notes for weekly updates to the plan of care

## 2021-01-04 NOTE — Progress Notes (Signed)
Inpatient Rehabilitation Care Coordinator Assessment and Plan Patient Details  Name: Natasha Vazquez MRN: [id number] Date of Birth: 07/07/1950  Today's Date: 01/04/2021  Hospital Problems: Principal Problem:   Left middle cerebral artery stroke (HCC)  Past Medical History:  Past Medical History:  Diagnosis Date   Hypertension    TMJ (dislocation of temporomandibular joint)    Past Surgical History:  Past Surgical History:  Procedure Laterality Date   ABDOMINAL HYSTERECTOMY     CHOLECYSTECTOMY     IR ANGIO INTRA EXTRACRAN SEL INTERNAL CAROTID BILAT MOD SED  09/16/2016   IR ANGIO VERTEBRAL SEL VERTEBRAL BILAT MOD SED  09/16/2016   MASS EXCISION Left 02/10/2020   Procedure: excision of soft tissue mass left upper back;  Surgeon: Lovick, Ayesha N, MD;  Location: [facility];  Service: General;  Laterality: Left;   Social History:  reports that she has never smoked. She has never used smokeless tobacco. She reports that she does not drink alcohol and does not use drugs.  Family / Support Systems Marital Status: Single Patient Roles: Parent Children: Joyce (Daughter) Anticipated Caregiver: Daughter, sister and other family Ability/Limitations of Caregiver: Daughter arranged 24/7. Pt siater will assist her inhome at d/c. Caregiver Availability: 24/7 Family Dynamics: Support from daughter, sister and other family members  Social History Preferred language: English Religion: [employer] Literacy - How often do you need to have someone help you when you read instructions, pamphlets, or other written material from your doctor or pharmacy?: Rarely Writes: Yes Employment Status: Employed Name of Employer: [employer] Return to Work Plans: TBS Legal History/Current Legal Issues: n/a Guardian/Conservator: Joyce   Abuse/Neglect Abuse/Neglect Assessment Can Be Completed: Yes Physical Abuse: Denies Verbal Abuse: Denies Sexual Abuse: Denies Exploitation  of patient/patient's resources: Denies Self-Neglect: Denies  Patient response to: Social Isolation - How often do you feel lonely or isolated from those around you?: Never  Emotional Status Pt's affect, behavior and adjustment status: n/a Recent Psychosocial Issues: n/a Psychiatric History: n/a Substance Abuse History: n/a  Patient / Family Perceptions, Expectations & Goals Pt/Family understanding of illness & functional limitations: yes Premorbid pt/family roles/activities: Patient independent and out daily Anticipated changes in roles/activities/participation: daughter, sister and family able to assisit with roles and tasks Pt/family expectations/goals: Supervision to min A  Community Resources Community Agencies: None Premorbid Home Care/DME Agencies: None Transportation available at discharge: daughter able to transport Is the patient able to respond to transportation needs?: Yes In the past 12 months, has lack of transportation kept you from medical appointments or from getting medications?: No In the past 12 months, has lack of transportation kept you from meetings, work, or from getting things needed for daily living?: No  Discharge Planning Living Arrangements: Alone Support Systems: Children Type of Residence: Private residence Insurance Resources: Medicaid (specify county) Financial Resources: Employment Financial Screen Referred: No Living Expenses: Own Money Management: Patient Does the patient have any problems obtaining your medications?: No Home Management: Independent Patient/Family Preliminary Plans: daughter and family able to assist if needed Care Coordinator Barriers to Discharge: Insurance for SNF coverage Care Coordinator Anticipated Follow Up Needs: HH/OP Expected length of stay: 7-10 Days  Clinical Impression Sw met with patient, introduced self, explained role. Contact information left for patient, sw will continue to follow up  Christina J  [place] 01/04/2021, 12:31 PM    
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
## 2021-01-05 NOTE — Progress Notes (Signed)
Speech Language Pathology Daily Session Note  Patient Details  Name: Natasha Vazquez MRN: [id number] Date of Birth: [birth year]  Today's Date: 01/05/2021 SLP Individual Time: 0900-0945 SLP Individual Time Calculation (min): 45 min  Short Term Goals: Week 1: SLP Short Term Goal 1 (Week 1): Patient will tolerate trials of regular texture solids with min-modA cues for awareness to and clearing of oral residuals. SLP Short Term Goal 2 (Week 1): Patient will point to objects and object pictures/photos when named in field of 3 with 80% accuracy and minA cues. SLP Short Term Goal 3 (Week 1): Patient will follow one-step basic level verbal directions with modA cues to redirect from perseverations. SLP Short Term Goal 4 (Week 1): Patient will imitate to perform gestures/signs to indicate basic wants/needs with maxA verbal, visual and tactile cues. SLP Short Term Goal 5 (Week 1): Patient will imitate during automatic speech tasks (ABC's, counting, singing, etc) with maxA cues (visual, verbal).  Skilled Therapeutic Interventions: Pt seen for skilled ST with focus on communication and swallow goals, pt up in recliner and motivated for therapeutic tasks. SLP providing yes/no communication board to trial. SLP facilitated session by providing max A multimodal cues for performing gestures and pointing to communication board in response to yes/no questions to communicate functional needs. Pt with inconsistent pointing to communication board, saying "no" and pointing to "yes", etc. However pt yes/no responses accurate on 25% of opportunities, emphatic responses when correct and confident with responses. Pt mostly verbalizing spontaneous "no" and "don't" throughout session. Pt with significant improvement in imitation during automatic speech tasks, most success with approximating words during "Twinkle Twinkle" and "Jingle Bells". Throughout these songs, "Happy Birthday" and automatic counting activity, pt producing  sound/approximate on 100% of syllables. Pt began to perseverate on "Twinkle" for remainder of session. SLP facilitating trial of regular snacks by providing Supervision A cues for lingual sweep and liquid wash to clear minimal stasis in R buccal area. Pt with no s/s aspiration throughout, will continue to assess tolerance for regular solids for potential upgrade. Pt left in recliner with alarm belt present and all needs within reach. Cont ST POC.   Pain Pain Assessment Pain Scale: 0-10 Pain Score: 0-No pain  Therapy/Group: Individual Therapy  Dewaine Conger 01/05/2021, 9:21 AM

## 2021-01-06 DIAGNOSIS — K5901 Slow transit constipation: Secondary | ICD-10-CM

## 2021-01-06 DIAGNOSIS — I10 Essential (primary) hypertension: Secondary | ICD-10-CM

## 2021-01-06 DIAGNOSIS — E785 Hyperlipidemia, unspecified: Secondary | ICD-10-CM

## 2021-01-06 MED ORDER — SORBITOL 70 % SOLN
30.0000 mL | Freq: Every day | Status: DC | PRN
  Administered 2021-01-06 – 2021-01-13 (×2): 30 mL via ORAL
  Filled 2021-01-06 (×2): qty 30
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
## 2021-01-06 NOTE — Progress Notes (Signed)
Pt with no BM past 72hr. Order for PRN Sorbitol obtained and given to pt.

## 2021-01-06 NOTE — Progress Notes (Signed)
PROGRESS NOTE   Subjective/Complaints: Patient seen laying in bed this morning.  No reported issues overnight.  Per nursing, patient constipated.  ROS limited due to aphasia   Objective:   No results found. No results for input(s): WBC, HGB, HCT, PLT in the last 72 hours.  No results for input(s): NA, K, CL, CO2, GLUCOSE, BUN, CREATININE, CALCIUM in the last 72 hours.   Intake/Output Summary (Last 24 hours) at 01/06/2021 1552 Last data filed at 01/06/2021 1304 Gross per 24 hour  Intake 600 ml  Output --  Net 600 ml         Physical Exam: Vital Signs Blood pressure 140/85, pulse 87, temperature 97.6 F (36.4 C), temperature source Oral, resp. rate 16, height 5\' 8"  (1.727 m), weight 70.9 kg, SpO2 99 %. Constitutional: No distress . Vital signs reviewed. HENT: Normocephalic.  Atraumatic. Eyes: EOMI. No discharge. Cardiovascular: No JVD.  RRR. Respiratory: Normal effort.  No stridor.  Bilateral clear to auscultation. GI: Non-distended.  BS +. Skin: Warm and dry.  Intact. Psych: Normal mood.  Normal behavior. Musc: No edema in extremities.  No tenderness in extremities. Neurologic: Alert Right facial weakness Expressive aphasia Motor: Limited due to aphasia, RUE shoulder abduction 3/5, handgrip 0/5  Assessment/Plan: 1. Functional deficits which require 3+ hours per day of interdisciplinary therapy in a comprehensive inpatient rehab setting. Physiatrist is providing close team supervision and 24 hour management of active medical problems listed below. Physiatrist and rehab team continue to assess barriers to discharge/monitor patient progress toward functional and medical goals  Care Tool:  Bathing    Body parts bathed by patient: Right arm, Chest, Abdomen, Front perineal area, Right upper leg, Left upper leg, Right lower leg, Left lower leg, Face   Body parts bathed by helper: Left arm, Buttocks     Bathing  assist Assist Level: Minimal Assistance - Patient > 75%     Upper Body Dressing/Undressing Upper body dressing   What is the patient wearing?: Pull over shirt    Upper body assist Assist Level: Moderate Assistance - Patient 50 - 74%    Lower Body Dressing/Undressing Lower body dressing      What is the patient wearing?: Pants     Lower body assist Assist for lower body dressing: Minimal Assistance - Patient > 75%     Toileting Toileting    Toileting assist Assist for toileting: Contact Guard/Touching assist     Transfers Chair/bed transfer  Transfers assist     Chair/bed transfer assist level: Contact Guard/Touching assist     Locomotion Ambulation   Ambulation assist      Assist level: Contact Guard/Touching assist Assistive device: No Device Max distance: >380ft   Walk 10 feet activity   Assist     Assist level: Contact Guard/Touching assist Assistive device: No Device   Walk 50 feet activity   Assist    Assist level: Contact Guard/Touching assist Assistive device: No Device    Walk 150 feet activity   Assist    Assist level: Contact Guard/Touching assist Assistive device: No Device    Walk 10 feet on uneven surface  activity   Assist  Assist level: Minimal Assistance - Patient > 75%     Wheelchair     Assist   Type of Wheelchair: Manual    Wheelchair assist level: Total Assistance - Patient < 25% Max wheelchair distance: 50    Wheelchair 50 feet with 2 turns activity    Assist        Assist Level: Maximal Assistance - Patient 25 - 49%   Wheelchair 150 feet activity     Assist      Assist Level: Total Assistance - Patient < 25%   Blood pressure 140/85, pulse 87, temperature 97.6 F (36.4 C), temperature source Oral, resp. rate 16, height 5\' 8"  (1.727 m), weight 70.9 kg, SpO2 99 %.  Medical Problem List and Plan: 1.  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  Antithrombotics: -DVT/anticoagulation:  Mechanical: Antiembolism stockings, thigh (TED hose) Bilateral lower extremities             -antiplatelet therapy: Aspirin 325 mg daily and Plavix 75 mg daily x3 months then aspirin alone 3. Pain Management: Tylenol as needed 4. Mood: Provide emotional support             -antipsychotic agents: N/A 5. Neuropsych: This patient is not capable of making decisions on her own behalf. 6. Skin/Wound Care: Routine skin checks 7. Fluids/Electrolytes/Nutrition: Routine in and outs 8.  Hypertension.  Continue Norvasc 2.5 mg daily.Her Cozaar 100 mg daily was held due to blood pressure being soft and resume as needed.  Monitor with increased mobility Vitals:   01/06/21 0342 01/06/21 1320  BP: 125/70 140/85  Pulse: 75 87  Resp: 18 16  Temp: 97.6 F (36.4 C)   SpO2: 100% 99%   Controlled on 12/4 9.  Hyperlipidemia.  Continue Crestor 10.  History of remote GI bleed 20 years ago.  Continue Protonix. 11.  Slow transit constipation  Bowel meds increased on 12/4    LOS: 4 days A FACE TO FACE EVALUATION WAS PERFORMED  Anna Beaird Lorie Phenix 01/06/2021, 3:52 PM
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
## 2021-01-07 NOTE — Progress Notes (Signed)
Orthopedic Tech Progress Note Patient Details:  Natasha Vazquez 03-13-1950 [id number]  Ortho Devices Type of Ortho Device: Prafo boot/shoe Ortho Device/Splint Interventions: Renard Matter 01/07/2021, 5:42 AM
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
## 2021-01-07 NOTE — Progress Notes (Signed)
PROGRESS NOTE   Subjective/Complaints:   Pt seen in gym , working with OT , remains severely aphasic   ROS limited due to aphasia   Objective:   No results found. No results for input(s): WBC, HGB, HCT, PLT in the last 72 hours.  No results for input(s): NA, K, CL, CO2, GLUCOSE, BUN, CREATININE, CALCIUM in the last 72 hours.   Intake/Output Summary (Last 24 hours) at 01/07/2021 0945 Last data filed at 01/07/2021 0700 Gross per 24 hour  Intake 600 ml  Output --  Net 600 ml         Physical Exam: Vital Signs Blood pressure 134/75, pulse 82, temperature 97.6 F (36.4 C), temperature source Oral, resp. rate 18, height 5\' 8"  (1.727 m), weight 70.9 kg, SpO2 100 %.  General: No acute distress Mood and affect are appropriate Heart: Regular rate and rhythm no rubs murmurs or extra sounds Lungs: Clear to auscultation, breathing unlabored, no rales or wheezes Abdomen: Positive bowel sounds, soft nontender to palpation, nondistended Extremities: No clubbing, cyanosis, or edema Skin: No evidence of breakdown, no evidence of rash Right facial weakness Mixed speech apraxia/Expressive aphasia Motor: Limited due to aphasia, RUE shoulder abduction 3/5, handgrip 0/5, 4/5 RLE, 5/5 on left UE and LE  Assessment/Plan: 1. Functional deficits which require 3+ hours per day of interdisciplinary therapy in a comprehensive inpatient rehab setting. Physiatrist is providing close team supervision and 24 hour management of active medical problems listed below. Physiatrist and rehab team continue to assess barriers to discharge/monitor patient progress toward functional and medical goals  Care Tool:  Bathing    Body parts bathed by patient: Right arm, Chest, Abdomen, Front perineal area, Right upper leg, Left upper leg, Right lower leg, Left lower leg, Face   Body parts bathed by helper: Left arm, Buttocks     Bathing assist Assist  Level: Minimal Assistance - Patient > 75%     Upper Body Dressing/Undressing Upper body dressing   What is the patient wearing?: Pull over shirt    Upper body assist Assist Level: Moderate Assistance - Patient 50 - 74%    Lower Body Dressing/Undressing Lower body dressing      What is the patient wearing?: Pants     Lower body assist Assist for lower body dressing: Minimal Assistance - Patient > 75%     Toileting Toileting    Toileting assist Assist for toileting: Contact Guard/Touching assist     Transfers Chair/bed transfer  Transfers assist     Chair/bed transfer assist level: Contact Guard/Touching assist     Locomotion Ambulation   Ambulation assist      Assist level: Contact Guard/Touching assist Assistive device: No Device Max distance: >388ft   Walk 10 feet activity   Assist     Assist level: Contact Guard/Touching assist Assistive device: No Device   Walk 50 feet activity   Assist    Assist level: Contact Guard/Touching assist Assistive device: No Device    Walk 150 feet activity   Assist    Assist level: Contact Guard/Touching assist Assistive device: No Device    Walk 10 feet on uneven surface  activity   Assist  Assist level: Minimal Assistance - Patient > 75%     Wheelchair     Assist   Type of Wheelchair: Manual    Wheelchair assist level: Total Assistance - Patient < 25% Max wheelchair distance: 50    Wheelchair 50 feet with 2 turns activity    Assist        Assist Level: Maximal Assistance - Patient 25 - 49%   Wheelchair 150 feet activity     Assist      Assist Level: Total Assistance - Patient < 25%   Blood pressure 134/75, pulse 82, temperature 97.6 F (36.4 C), temperature source Oral, resp. rate 18, height 5\' 8"  (1.727 m), weight 70.9 kg, SpO2 100 %.  Medical Problem List and Plan: 1.  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  Antithrombotics: -DVT/anticoagulation:  Mechanical: Antiembolism stockings, thigh (TED hose) Bilateral lower extremities             -antiplatelet therapy: Aspirin 325 mg daily and Plavix 75 mg daily x3 months then aspirin alone 3. Pain Management: Tylenol as needed 4. Mood: Provide emotional support             -antipsychotic agents: N/A 5. Neuropsych: This patient is not capable of making decisions on her own behalf. 6. Skin/Wound Care: Routine skin checks 7. Fluids/Electrolytes/Nutrition: Routine in and outs 8.  Hypertension.  Continue Norvasc 2.5 mg daily.Her Cozaar 100 mg daily was held due to blood pressure being soft and resume as needed.  Monitor with increased mobility Vitals:   01/06/21 1907 01/07/21 0336  BP: (!) 123/108 134/75  Pulse: 86 82  Resp: 18 18  Temp: 98.1 F (36.7 C) 97.6 F (36.4 C)  SpO2: 100% 100%   Controlled on 12/5 9.  Hyperlipidemia.  Continue Crestor 10.  History of remote GI bleed 20 years ago.  Continue Protonix. 11.  Slow transit constipation  Bowel meds increased on 12/4- cont BM this am     LOS: 5 days A FACE TO FACE EVALUATION WAS PERFORMED  Natasha Vazquez 01/07/2021, 9:45 AM
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
## 2021-01-07 NOTE — Progress Notes (Signed)
Speech Language Pathology Daily Session Note  Patient Details  Name: Natasha Vazquez MRN: [id number] Date of Birth: 1950-09-24  Today's Date: 01/07/2021 SLP Individual Time: 6314-9702 SLP Individual Time Calculation (min): 45 min  Short Term Goals: Week 1: SLP Short Term Goal 1 (Week 1): Patient will tolerate trials of regular texture solids with min-modA cues for awareness to and clearing of oral residuals. SLP Short Term Goal 2 (Week 1): Patient will point to objects and object pictures/photos when named in field of 3 with 80% accuracy and minA cues. SLP Short Term Goal 3 (Week 1): Patient will follow one-step basic level verbal directions with modA cues to redirect from perseverations. SLP Short Term Goal 4 (Week 1): Patient will imitate to perform gestures/signs to indicate basic wants/needs with maxA verbal, visual and tactile cues. SLP Short Term Goal 5 (Week 1): Patient will imitate during automatic speech tasks (ABC's, counting, singing, etc) with maxA cues (visual, verbal).  Skilled Therapeutic Interventions: Skilled ST treatment focused on communication goals. SLP facilitated session by assessing yes/no accuracy via verbal response and trial of communication board. Per verbal response, pt approximately 25% accurate with biographical yes/no questions, and <25% accurate with environmental yes/no questions. Pt continues to confuse responses by expressing "no" when she intends yes" with evident attempts to self correct. Pt was unable to identify written yes vs. no at baseline which impacted ability to utilize communication board. Pt consistently pointed to "yes" for all questions. Facilitated automatic speech tasks with max A multimodal cues. Pt was unsuccessful with counting 1-10 today and perseverated on the word "talking." She approximated singing tasks with 10-20% accuracy while perseverating again on the words "talking", "no", and/or neologisms. Also facilitated object identification in  field of 3 with mod A verbal and written cues to achieve 50% accuracy. Suspect written cues were not effective due to suspected reading deficits. Patient was left in chair with alarm activated and immediate needs within reach at end of session. Continue per current plan of care.      Pain Pain Assessment Pain Scale: 0-10 Pain Score: 0-No pain  Therapy/Group: Individual Therapy  Patty Sermons 01/07/2021, 12:25 PM

## 2021-01-08 NOTE — Progress Notes (Signed)
PROGRESS NOTE   Subjective/Complaints:   Able to correctly use Yes no on communication board but defaults to "no " verbally to almost all questions   ROS limited due to aphasia   Objective:   No results found. No results for input(s): WBC, HGB, HCT, PLT in the last 72 hours.  No results for input(s): NA, K, CL, CO2, GLUCOSE, BUN, CREATININE, CALCIUM in the last 72 hours.   Intake/Output Summary (Last 24 hours) at 01/08/2021 0951 Last data filed at 01/08/2021 0858 Gross per 24 hour  Intake 240 ml  Output --  Net 240 ml         Physical Exam: Vital Signs Blood pressure 125/77, pulse 79, temperature 97.8 F (36.6 C), resp. rate 16, height 5\' 8"  (1.727 m), weight 70.9 kg, SpO2 99 %.  General: No acute distress Mood and affect are appropriate Heart: Regular rate and rhythm no rubs murmurs or extra sounds Lungs: Clear to auscultation, breathing unlabored, no rales or wheezes Abdomen: Positive bowel sounds, soft nontender to palpation, nondistended Extremities: No clubbing, cyanosis, or edema Skin: No evidence of breakdown, no evidence of rash Right facial weakness Mixed speech apraxia/Expressive aphasia Motor: Limited due to aphasia, RUE shoulder abduction 3/5, handgrip 0/5, 4/5 RLE, 5/5 on left UE and LE  Assessment/Plan: 1. Functional deficits which require 3+ hours per day of interdisciplinary therapy in a comprehensive inpatient rehab setting. Physiatrist is providing close team supervision and 24 hour management of active medical problems listed below. Physiatrist and rehab team continue to assess barriers to discharge/monitor patient progress toward functional and medical goals  Care Tool:  Bathing    Body parts bathed by patient: Right arm, Chest, Abdomen, Front perineal area, Right upper leg, Left upper leg, Right lower leg, Left lower leg, Face   Body parts bathed by helper: Left arm, Buttocks      Bathing assist Assist Level: Minimal Assistance - Patient > 75%     Upper Body Dressing/Undressing Upper body dressing   What is the patient wearing?: Pull over shirt    Upper body assist Assist Level: Moderate Assistance - Patient 50 - 74%    Lower Body Dressing/Undressing Lower body dressing      What is the patient wearing?: Pants     Lower body assist Assist for lower body dressing: Minimal Assistance - Patient > 75%     Toileting Toileting    Toileting assist Assist for toileting: Contact Guard/Touching assist     Transfers Chair/bed transfer  Transfers assist     Chair/bed transfer assist level: Contact Guard/Touching assist     Locomotion Ambulation   Ambulation assist      Assist level: Contact Guard/Touching assist Assistive device: No Device Max distance: >352ft   Walk 10 feet activity   Assist     Assist level: Contact Guard/Touching assist Assistive device: No Device   Walk 50 feet activity   Assist    Assist level: Contact Guard/Touching assist Assistive device: No Device    Walk 150 feet activity   Assist    Assist level: Contact Guard/Touching assist Assistive device: No Device    Walk 10 feet on uneven surface  activity  Assist     Assist level: Minimal Assistance - Patient > 75%     Wheelchair     Assist Is the patient using a wheelchair?: Yes Type of Wheelchair: Manual    Wheelchair assist level: Total Assistance - Patient < 25% Max wheelchair distance: 50    Wheelchair 50 feet with 2 turns activity    Assist        Assist Level: Maximal Assistance - Patient 25 - 49%   Wheelchair 150 feet activity     Assist      Assist Level: Total Assistance - Patient < 25%   Blood pressure 125/77, pulse 79, temperature 97.8 F (36.6 C), resp. rate 16, height 5\' 8"  (1.727 m), weight 70.9 kg, SpO2 99 %.  Medical Problem List and Plan: 1.  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  Antithrombotics: -DVT/anticoagulation:  Mechanical: Antiembolism stockings, thigh (TED hose) Bilateral lower extremities             -antiplatelet therapy: Aspirin 325 mg daily and Plavix 75 mg daily x3 months then aspirin alone 3. Pain Management: Tylenol as needed 4. Mood: Provide emotional support             -antipsychotic agents: N/A 5. Neuropsych: This patient is not capable of making decisions on her own behalf. 6. Skin/Wound Care: Routine skin checks 7. Fluids/Electrolytes/Nutrition: Routine in and outs 8.  Hypertension.  Continue Norvasc 2.5 mg daily.Her Cozaar 100 mg daily was held due to blood pressure being soft and resume as needed.  Monitor with increased mobility Vitals:   01/07/21 1915 01/08/21 0523  BP: 129/68 125/77  Pulse: 81 79  Resp: 18 16  Temp: 97.9 F (36.6 C) 97.8 F (36.6 C)  SpO2: 97% 99%   Controlled on 12/6 9.  Hyperlipidemia.  Continue Crestor 10.  History of remote GI bleed 20 years ago.  Continue Protonix. 11.  Slow transit constipation  Bowel meds increased on 12/4- cont BM this am     LOS: 6 days A FACE TO FACE EVALUATION WAS PERFORMED  Charlett Blake 01/08/2021, 9:51 AM

## 2021-01-08 NOTE — Progress Notes (Signed)
Speech Language Pathology Daily Session Note  Patient Details  Name: Natasha Vazquez MRN: [id number] Date of Birth: 1950-03-09  Today's Date: 01/08/2021 SLP Individual Time: 0800-0900 SLP Individual Time Calculation (min): 60 min  Short Term Goals: Week 1: SLP Short Term Goal 1 (Week 1): Patient will tolerate trials of regular texture solids with min-modA cues for awareness to and clearing of oral residuals. SLP Short Term Goal 2 (Week 1): Patient will point to objects and object pictures/photos when named in field of 3 with 80% accuracy and minA cues. SLP Short Term Goal 3 (Week 1): Patient will follow one-step basic level verbal directions with modA cues to redirect from perseverations. SLP Short Term Goal 4 (Week 1): Patient will imitate to perform gestures/signs to indicate basic wants/needs with maxA verbal, visual and tactile cues. SLP Short Term Goal 5 (Week 1): Patient will imitate during automatic speech tasks (ABC's, counting, singing, etc) with maxA cues (visual, verbal).  Skilled Therapeutic Interventions: Skilled ST treatment focused on communication goals. SLP facilitated session by providing max A verbal/visual/tactile cueing for imitating oral motor movements in front of mirror. Pt executed with approximately 25% accuracy and was most successful with puckering lips. Facilitated automatic speech tasks with max A verbal/visual/tactile cues to achieve ~10% accuracy. She achieved 20% accuracy with counting 1-10. Unable to verbalize days of week or cloze phrase however displayed approximations with oral motor movements. Pt perseverated on the word "talking" and "no" throughout session however exhibited improved intonation with singing (ABCs, Happy Rudene Anda, Leone Payor). Pt followed one-step directions with max A verbal/visual/tactile cues to achieve 10% accuracy and required total A to execute other commands. Pt successfully communicated her needs via pointing with min A cues for  effectiveness. Patient was left in recliner with alarm activated and immediate needs within reach at end of session. Continue per current plan of care.      Pain Pain Assessment Pain Scale: 0-10 Pain Score: 0-No pain  Therapy/Group: Individual Therapy  Patty Sermons 01/08/2021, 8:12 AM

## 2021-01-08 NOTE — Plan of Care (Signed)
  Problem: RH Bathing Goal: LTG Patient will bathe all body parts with assist levels (OT) Description: LTG: Patient will bathe all body parts with assist levels (OT) Flowsheets (Taken 01/08/2021 1255) LTG: Pt will perform bathing with assistance level/cueing: (Downgraded due to poor carryover of education.) Minimal Assistance - Patient > 75% Note: Downgraded due to poor carryover of education.   Problem: RH Dressing Goal: LTG Patient will perform upper body dressing (OT) Description: LTG Patient will perform upper body dressing with assist, with/without cues (OT). Flowsheets (Taken 01/08/2021 1255) LTG: Pt will perform upper body dressing with assistance level of: (Downgraded due to poor carryover of education.) Minimal Assistance - Patient > 75% Note: Downgraded due to poor carryover of education.
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
## 2021-01-09 NOTE — Progress Notes (Signed)
PROGRESS NOTE   Subjective/Complaints:   Remains aphasic  ROS limited due to aphasia   Objective:   No results found. No results for input(s): WBC, HGB, HCT, PLT in the last 72 hours.  No results for input(s): NA, K, CL, CO2, GLUCOSE, BUN, CREATININE, CALCIUM in the last 72 hours.   Intake/Output Summary (Last 24 hours) at 01/09/2021 0854 Last data filed at 01/08/2021 0858 Gross per 24 hour  Intake 120 ml  Output --  Net 120 ml         Physical Exam: Vital Signs Blood pressure 105/63, pulse 79, temperature 97.8 F (36.6 C), resp. rate 18, height _0  (1.727 m), weight 70.9 kg, SpO2 100 %.   General: No acute distress Mood and affect are appropriate Heart: Regular rate and rhythm no rubs murmurs or extra sounds Lungs: Clear to auscultation, breathing unlabored, no rales or wheezes Abdomen: Positive bowel sounds, soft nontender to palpation, nondistended Extremities: No clubbing, cyanosis, or edema Skin: No evidence of breakdown, no evidence of rash   Right facial weakness Mixed speech apraxia/Expressive aphasia Motor: Limited due to aphasia, RUE shoulder abduction 3/5, handgrip 0/5, 4/5 RLE, 5/5 on left UE and LE  Assessment/Plan: 1. Functional deficits which require 3+ hours per day of interdisciplinary therapy in a comprehensive inpatient rehab setting. Physiatrist is providing close team supervision and 24 hour management of active medical problems listed below. Physiatrist and rehab team continue to assess barriers to discharge/monitor patient progress toward functional and medical goals  Care Tool:  Bathing    Body parts bathed by patient: Right arm, Chest, Abdomen, Front perineal area, Right upper leg, Left upper leg, Right lower leg, Left lower leg, Face   Body parts bathed by helper: Left arm, Buttocks     Bathing assist Assist Level: Minimal Assistance - Patient > 75%     Upper Body  Dressing/Undressing Upper body dressing   What is the patient wearing?: Pull over shirt    Upper body assist Assist Level: Minimal Assistance - Patient > 75%    Lower Body Dressing/Undressing Lower body dressing      What is the patient wearing?: Pants     Lower body assist Assist for lower body dressing: Contact Guard/Touching assist     Toileting Toileting    Toileting assist Assist for toileting: Contact Guard/Touching assist     Transfers Chair/bed transfer  Transfers assist     Chair/bed transfer assist level: Contact Guard/Touching assist     Locomotion Ambulation   Ambulation assist      Assist level: Contact Guard/Touching assist Assistive device: No Device Max distance: >340f   Walk 10 feet activity   Assist     Assist level: Contact Guard/Touching assist Assistive device: No Device   Walk 50 feet activity   Assist    Assist level: Contact Guard/Touching assist Assistive device: No Device    Walk 150 feet activity   Assist    Assist level: Contact Guard/Touching assist Assistive device: No Device    Walk 10 feet on uneven surface  activity   Assist     Assist level: Minimal Assistance - Patient > 75%  Wheelchair     Assist Is the patient using a wheelchair?: Yes Type of Wheelchair: Manual    Wheelchair assist level: Total Assistance - Patient < 25% Max wheelchair distance: 50    Wheelchair 50 feet with 2 turns activity    Assist        Assist Level: Maximal Assistance - Patient 25 - 49%   Wheelchair 150 feet activity     Assist      Assist Level: Total Assistance - Patient < 25%   Blood pressure 105/63, pulse 79, temperature 97.8 F (36.6 C), resp. rate 18, height _0  (1.727 m), weight 70.9 kg, SpO2 100 %.  Medical Problem List and Plan: 1.  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 Formulized individual treatment plan based on medical history, underlying problem and comorbidities.   WHO ordered 2.  Antithrombotics: -DVT/anticoagulation:  Mechanical: Antiembolism stockings, thigh (TED hose) Bilateral lower extremities             -antiplatelet therapy: Aspirin 325 mg daily and Plavix 75 mg daily x3 months then aspirin alone 3. Pain Management: Tylenol as needed 4. Mood: Provide emotional support             -antipsychotic agents: N/A 5. Neuropsych: This patient is not capable of making decisions on her own behalf. 6. Skin/Wound Care: Routine skin checks 7. Fluids/Electrolytes/Nutrition: Routine in and outs 8.  Hypertension.  Continue Norvasc 2.5 mg daily.Her Cozaar 100 mg daily was held due to blood pressure being soft and resume as needed.  Monitor with increased mobility Vitals:   01/08/21 1934 01/09/21 0509  BP: (!) 119/59 105/63  Pulse: 80 79  Resp: 18 18  Temp: 97.8 F (36.6 C) 97.8 F (36.6 C)  SpO2: 100% 100%   Controlled on 12/7 9.  Hyperlipidemia.  Continue Crestor 10.  History of remote GI bleed 20 years ago.  Continue Protonix. 11.  Slow transit constipation  Bowel meds increased on 12/4- cont BM this am     LOS: 7 days A FACE TO FACE EVALUATION WAS PERFORMED  Charlett Blake 01/09/2021, 8:54 AM

## 2021-01-09 NOTE — Progress Notes (Signed)
Patient ID: Natasha Vazquez, female   DOB: [birth year], 70 y.o.   MRN: [id number]  Team Conference Report to Patient/Family  Team Conference discussion was reviewed with the patient and caregiver, including goals, any changes in plan of care and target discharge date.  Patient and caregiver express understanding and are in agreement.  The patient has a target discharge date of 01/15/21.  Sw met with patient and spoke with patient daughter. Provided conference updates. Daughter plans to attend family education on Friday 12/9 and Saturday 12/10 8-12  [name] 01/09/2021, 12:32 PM

## 2021-01-09 NOTE — Patient Care Conference (Signed)
Inpatient RehabilitationTeam Conference and Plan of Care Update Date: 01/09/2021   Time: 10:00 AM    Patient Name: Natasha Vazquez      Medical Record Number: [id number]  Date of Birth: 20-Sep-1950 Sex: Female         Room/Bed: 4W05C/4W05C-01 Payor Info: Payor: [employer] MEDICARE / Plan: [facility] MEDICARE / Product Type: *No Product type* /    Admit Date/Time:  01/02/2021  1:10 PM  Primary Diagnosis:  Left middle cerebral artery stroke [facility])  Hospital Problems: Principal Problem:   Left middle cerebral artery stroke [facility]) Active Problems:   Slow transit constipation   Dyslipidemia    Expected Discharge Date: Expected Discharge Date: 01/15/21  Team Members Present: Physician leading conference: Dr. Alysia Penna Social Worker Present: Erlene Quan, BSW Nurse Present: Dorien Chihuahua, RN PT Present: Barrie Folk, PT OT Present: Providence Lanius, OT SLP Present: Sherren Kerns, SLP     Current Status/Progress Goal Weekly Team Focus  Bowel/Bladder   Continent of b/b. LBM 12/6  Remain continent  Toilet PRN   Swallow/Nutrition/ Hydration   min A  sup-to-min A  regular PO trials   ADL's   min A UB bathing/dressing at sink/LB dressing, CGA LB bathing at sink, CGA ambulatory bathroom transfers and toileting; improved apraxia/receptive language but poor carryover of hemi techniques  S overall, min A UB dressing/bathing  pt/family education, safety awareness, RUE NMR, dynamic standing balance, R inattention, activity tolerance   Mobility   Bed mobility = supervision; Transfers = supervision/ CGA for balance and safety awareness; ambulation = CGA with intermittent MinA for missteps and balance - no AD  Bed mobility and transfers = Mod I; Car transfer with SETUP, Ambulation and stairs with supervision  Standing balance training, dual tasking with aphasia, safety awareness training,visual field deficits,  family education   Communication   max A  mod A expression, min A  comprehension  functional communication, following 1-step directions, yes/no responses, automatic speech tasks, imitation   Safety/Cognition/ Behavioral Observations            Pain   No c/o pain  Pain <3/10  Assess Qshift and prn   Skin   Skin intact  Maintain skin integrity  Assess Qshift and PRN     Discharge Planning:  Discharging home with daughter and family to provide 23/7 supervision   Team Discussion: Patient with right hemiparesis and aphasia post Left MCA CVA with poor awareness, aphasia, right inattention and visual field deficits. Progress with communication is limited by perseveration and yes/no responses are not accurate.   Patient on target to meet rehab goals: Yes, currently needs CGA for transfers and toileting without and assistive device. Needs min assist for lower body dressing seated or standing. Needs min assist for upper body bathing and dressing. Able to incorporate right upper extremity into her care. Goals for discharge set for CGA - supervision level with mod I for gait.   *See Care Plan and progress notes for long and short-term goals.   Revisions to Treatment Plan:  Declined communication board Downgraded goals for SLP and OT Regular diet trials   Teaching Needs: Safety, transfers, toileting, medications, secondary risk management, etc.   Current Barriers to Discharge: Decreased caregiver support  Possible Resolutions to Barriers: Family education     Medical Summary Current Status: Aphasia, RUE weakness, HTN controlled although soft  Barriers to Discharge: Medical stability   Possible Resolutions to Barriers/Weekly Focus: may need to reduce BP meds , monitor for symptomatic  hypotension   Continued Need for Acute Rehabilitation Level of Care: The patient requires daily medical management by a physician with specialized training in physical medicine and rehabilitation for the following reasons: Direction of a multidisciplinary physical  rehabilitation program to maximize functional independence : Yes Medical management of patient stability for increased activity during participation in an intensive rehabilitation regime.: Yes Analysis of laboratory values and/or radiology reports with any subsequent need for medication adjustment and/or medical intervention. : Yes   I attest that I was present, lead the team conference, and concur with the assessment and plan of the team.   Dorien Chihuahua B 01/09/2021, 3:49 PM
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
## 2021-01-09 NOTE — Progress Notes (Signed)
Patient ID: Natasha Vazquez, female   DOB: March 31, 1950, 70 y.o.   MRN: [id number]  SW made attempt to call daughter to provide team conference updates. Unable to leave VM, will make an additional attempt.  [place], [place]

## 2021-01-09 NOTE — Progress Notes (Addendum)
Speech Language Pathology Daily Session Note  Patient Details  Name: Natasha Vazquez MRN: [id number] Date of Birth: 12-Nov-1950  Today's Date: 01/09/2021 SLP Individual Time: 1100-1200 SLP Individual Time Calculation (min): 60 min  Short Term Goals: Week 1: SLP Short Term Goal 1 (Week 1): Patient will tolerate trials of regular texture solids with min-modA cues for awareness to and clearing of oral residuals. SLP Short Term Goal 2 (Week 1): Patient will point to objects and object pictures/photos when named in field of 3 with 80% accuracy and minA cues. SLP Short Term Goal 3 (Week 1): Patient will follow one-step basic level verbal directions with modA cues to redirect from perseverations. SLP Short Term Goal 4 (Week 1): Patient will imitate to perform gestures/signs to indicate basic wants/needs with maxA verbal, visual and tactile cues. SLP Short Term Goal 5 (Week 1): Patient will imitate during automatic speech tasks (ABC's, counting, singing, etc) with maxA cues (visual, verbal).  Skilled Therapeutic Interventions: Skilled ST treatment focused on swallowing and communication goals. SLP facilitated regular texture PO trials in which patient tolerated with mildly prolonged yet effective mastication, effective oral clearance, and trace anterior labial spillage on right side with min A verbal cues for awareness of residuals. Pt without s/sx of aspiration. Pt appears clinically appropriate for diet advancement to regular solids however patient shook her head no when asked and indicated preference for continuation of dys 3 diet at this time and gestured chopping action. When asked, she nodded her head yes to indicate she prefers chopped foods. SLP agreeable but provided encouragement and education regarding current swallowing function and goal of working towards diet advancement. Will continue to discuss preferences with patient regarding diet advancement. SLP also facilitated automatic speech tasks with  max A verbal/visual cues to achieve 10% accuracy with counting 1-10, 20% accuracy with singing ([employer] [place], We wish you a merry christmas). Patient identified objects in field of 2 given semantic feature with 50% accuracy without cues, and 66% accuracy with mod A verbal and visual cues. Patient performed color matching with 100% accuracy at independent level and additional processing time. She followed one-step directions using objects with mod-to-max A to achieve 25% accuracy. Pt followed simple one-step commands during unstructured speech tasks with min A verbal or visual cues. Patient remains highly motivated to participate. Patient was left in recliner with alarm activated and immediate needs within reach at end of session. Continue per current plan of care.      Pain  No pain  Therapy/Group: Individual Therapy  Patty Sermons 01/09/2021, 3:49 PM
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
## 2021-01-10 NOTE — Progress Notes (Signed)
Monitoring on rounding per department standards and protocol in on acute distress or discomfort. Uneventful night, call bell in place and bed alarm monitoring for safety . Continue regime

## 2021-01-10 NOTE — Progress Notes (Signed)
Speech Language Pathology Weekly Progress and Session Note  Patient Details  Name: Natasha Vazquez MRN: [id number] Date of Birth: 1951-01-26  Beginning of progress report period: January 03, 2021 End of progress report period: January 10, 2021  Today's Date: 01/10/2021 SLP Individual Time: 1440-1540 SLP Individual Time Calculation (min): 60 min  Short Term Goals: Week 1: SLP Short Term Goal 1 (Week 1): Patient will tolerate trials of regular texture solids with min-modA cues for awareness to and clearing of oral residuals. SLP Short Term Goal 1 - Progress (Week 1): Met SLP Short Term Goal 2 (Week 1): Patient will point to objects and object pictures/photos when named in field of 3 with 80% accuracy and minA cues. SLP Short Term Goal 2 - Progress (Week 1): Not met SLP Short Term Goal 3 (Week 1): Patient will follow one-step basic level verbal directions with modA cues to redirect from perseverations. SLP Short Term Goal 3 - Progress (Week 1): Met SLP Short Term Goal 4 (Week 1): Patient will imitate to perform gestures/signs to indicate basic wants/needs with maxA verbal, visual and tactile cues. SLP Short Term Goal 4 - Progress (Week 1): Met SLP Short Term Goal 5 (Week 1): Patient will imitate during automatic speech tasks (ABC's, counting, singing, etc) with maxA cues (visual, verbal). SLP Short Term Goal 5 - Progress (Week 1): Met  New Short Term Goals: Week 2: SLP Short Term Goal 1 (Week 2): STG=LTG due to ELOS  Weekly Progress Updates: Patient has made slow gains and has met 4 of 5 STGs this reporting period. Currently, patient demonstrates improved receptive language skills and requires overall mod A verbal and visual cues to for following one-step directions, answering yes/no questions (can be min A at times), and imitating/performing gestures. Patient has made slower progress with expressive language skills and currently requires max A for multimodal communication. She remains  substantially limited by perseveration which significantly impacts her ability to communicate functional needs. Her ability to utilize gestures to communicate needs and ability to verbalize "yes/yeah/uh-huh" and "no" has improved, although responses can be inconsistent. Patient remains hopeful and extremely motivated to participate in treatment. She is currently consuming and tolerating a dysphagia 3 diet and thin liquid diet with mild anterior labial spillage, mildly prolonged yet effective mastication, minimal-to-no pocketing, and without overt s/sx of aspiration. Regular PO trial have been completed suggestive of clinical appropriateness to advance diet, however pt indicated wishes to remain on Dys 3 diet at this time. SLP has provided encouragement and communicated goals for working toward diet advancement. Patient and family education is ongoing. Patient would benefit from continued skilled SLP intervention to maximize language skills and overall functional communication prior to discharge.    Intensity: Minumum of 1-2 x/day, 30 to 90 minutes Frequency: 3 to 5 out of 7 days Duration/Length of Stay: 12/13 Treatment/Interventions: Dysphagia/aspiration precaution training;Speech/Language facilitation;Environmental controls;Cueing hierarchy;Oral motor exercises;Functional tasks;Multimodal communication approach;Patient/family education   Daily Session Skilled Therapeutic Interventions: Skilled ST treatment focused on communication goals. SLP facilitated session by providing max A verbal/visual cues for automatic speech tasks to achieve 40% accuracy with counting 1-10, and 10-20% accuracy with singing. Pt imitated gestures/signs to indicate basic wants/needs with max fading to mod A verbal and visual cues and some instances only supervision assist. Improved motor planning noted to execute movements today. Patient followed basic one-step directions with max A fading to mod A verbal and visual cues to achieve  50% accuracy. Pt verbally repeated therapist "look up" during one occasion.  Patient continues to perseverate substantially on the words "talking", "one", and "no." Facilitated yes/no responses via multimodal communication (gestures, verbal response, head nod) with max A to achieve 50% accuracy. She also required mod-to-max A for use of yes/no board and was more likely to point to "no" today which is improvement considering she would only point to yes during previous sessions. Pt continues to elicit inconsistencies with yes/no answers (i.e., patient will verbalize "no" while shaking her head yes and vice versa.) Patient was left in recliner with alarm activated and immediate needs within reach at end of session. Continue per current plan of care.      General    Pain  No pain  Therapy/Group: Individual Therapy  Patty Sermons 01/10/2021, 5:08 PM

## 2021-01-10 NOTE — Progress Notes (Signed)
PROGRESS NOTE   Subjective/Complaints:   Remains aphasic, discussed d/c date  ROS limited due to aphasia   Objective:   No results found. No results for input(s): WBC, HGB, HCT, PLT in the last 72 hours.  No results for input(s): NA, K, CL, CO2, GLUCOSE, BUN, CREATININE, CALCIUM in the last 72 hours.   Intake/Output Summary (Last 24 hours) at 01/10/2021 0904 Last data filed at 01/10/2021 0854 Gross per 24 hour  Intake 680 ml  Output 0 ml  Net 680 ml         Physical Exam: Vital Signs Blood pressure 120/82, pulse (!) 107, temperature 98.5 F (36.9 C), resp. rate 15, height 5\' 8"  (1.727 m), weight 70.9 kg, SpO2 99 %.   General: No acute distress Mood and affect are appropriate Heart: Regular rate and rhythm no rubs murmurs or extra sounds Lungs: Clear to auscultation, breathing unlabored, no rales or wheezes Abdomen: Positive bowel sounds, soft nontender to palpation, nondistended Extremities: No clubbing, cyanosis, or edema Skin: No evidence of breakdown, no evidence of rash   Right facial weakness Mixed speech apraxia/Expressive aphasia Motor: Limited due to aphasia, RUE shoulder abduction 3/5, handgrip 0/5, 4/5 RLE, 5/5 on left UE and LE  Assessment/Plan: 1. Functional deficits which require 3+ hours per day of interdisciplinary therapy in a comprehensive inpatient rehab setting. Physiatrist is providing close team supervision and 24 hour management of active medical problems listed below. Physiatrist and rehab team continue to assess barriers to discharge/monitor patient progress toward functional and medical goals  Care Tool:  Bathing    Body parts bathed by patient: Right arm, Chest, Abdomen, Front perineal area, Right upper leg, Left upper leg, Right lower leg, Left lower leg, Face   Body parts bathed by helper: Left arm, Buttocks     Bathing assist Assist Level: Minimal Assistance - Patient >  75%     Upper Body Dressing/Undressing Upper body dressing   What is the patient wearing?: Pull over shirt    Upper body assist Assist Level: Minimal Assistance - Patient > 75%    Lower Body Dressing/Undressing Lower body dressing      What is the patient wearing?: Pants     Lower body assist Assist for lower body dressing: Contact Guard/Touching assist     Toileting Toileting    Toileting assist Assist for toileting: Contact Guard/Touching assist     Transfers Chair/bed transfer  Transfers assist     Chair/bed transfer assist level: Contact Guard/Touching assist     Locomotion Ambulation   Ambulation assist      Assist level: Contact Guard/Touching assist Assistive device: No Device Max distance: >371ft   Walk 10 feet activity   Assist     Assist level: Contact Guard/Touching assist Assistive device: No Device   Walk 50 feet activity   Assist    Assist level: Contact Guard/Touching assist Assistive device: No Device    Walk 150 feet activity   Assist    Assist level: Contact Guard/Touching assist Assistive device: No Device    Walk 10 feet on uneven surface  activity   Assist     Assist level: Minimal Assistance - Patient >  75%     Wheelchair     Assist Is the patient using a wheelchair?: Yes Type of Wheelchair: Manual    Wheelchair assist level: Total Assistance - Patient < 25% Max wheelchair distance: 50    Wheelchair 50 feet with 2 turns activity    Assist        Assist Level: Maximal Assistance - Patient 25 - 49%   Wheelchair 150 feet activity     Assist      Assist Level: Total Assistance - Patient < 25%   Blood pressure 120/82, pulse (!) 107, temperature 98.5 F (36.9 C), resp. rate 15, height 5\' 8"  (1.727 m), weight 70.9 kg, SpO2 99 %.  Medical Problem List and Plan: 1.  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  Antithrombotics: -DVT/anticoagulation:  Mechanical: Antiembolism stockings, thigh (TED hose) Bilateral lower extremities             -antiplatelet therapy: Aspirin 325 mg daily and Plavix 75 mg daily x3 months then aspirin alone 3. Pain Management: Tylenol as needed 4. Mood: Provide emotional support             -antipsychotic agents: N/A 5. Neuropsych: This patient is not capable of making decisions on her own behalf. 6. Skin/Wound Care: Routine skin checks 7. Fluids/Electrolytes/Nutrition: Routine in and outs 8.  Hypertension.  Continue Norvasc 2.5 mg daily.Her Cozaar 100 mg daily was held due to blood pressure being soft and resume as needed.  Monitor with increased mobility Vitals:   01/09/21 1244 01/09/21 2000  BP: 122/87 120/82  Pulse: 76 (!) 107  Resp: 17 15  Temp: 97.8 F (36.6 C) 98.5 F (36.9 C)  SpO2: 99% 99%   Controlled on 12/8, mild tachy yest PM but all other values <100bpm-monitor  9.  Hyperlipidemia.  Continue Crestor 10.  History of remote GI bleed 20 years ago.  Continue Protonix. 11.  Slow transit constipation  Bowel meds increased on 12/4- cont BM this am     LOS: 8 days A FACE TO FACE EVALUATION WAS PERFORMED  Charlett Blake 01/10/2021, 9:04 AM

## 2021-01-11 NOTE — Progress Notes (Signed)
Patient noted to be up in chair eating  breakfast upon rounding, denies pain or discomfort, no distress observed,Chair alarm sounded off, upon staff arriving to room , patient was found in shower indicating she was okay to staff she needed to get up.Assigned oncoming nurse made aware, no c/o or acute distress noted

## 2021-01-11 NOTE — Progress Notes (Signed)
PROGRESS NOTE   Subjective/Complaints:   Remains aphasic/apraxic, discussed d/c date with Husband and family who is visiting   ROS limited due to aphasia   Objective:   No results found. No results for input(s): WBC, HGB, HCT, PLT in the last 72 hours.  No results for input(s): NA, K, CL, CO2, GLUCOSE, BUN, CREATININE, CALCIUM in the last 72 hours.   Intake/Output Summary (Last 24 hours) at 01/11/2021 0952 Last data filed at 01/11/2021 0950 Gross per 24 hour  Intake 580 ml  Output 0 ml  Net 580 ml         Physical Exam: Vital Signs Blood pressure 124/88, pulse 93, temperature 98.3 F (36.8 C), resp. rate 15, height 5\' 8"  (1.727 m), weight 70.9 kg, SpO2 100 %.   General: No acute distress Mood and affect are appropriate Heart: Regular rate and rhythm no rubs murmurs or extra sounds Lungs: Clear to auscultation, breathing unlabored, no rales or wheezes Abdomen: Positive bowel sounds, soft nontender to palpation, nondistended Extremities: No clubbing, cyanosis, or edema Skin: No evidence of breakdown, no evidence of rash  Right facial weakness Mixed speech apraxia/Global aphasia Motor: Limited due to aphasia, RUE shoulder abduction, bideps and triceps 3/5, handgrip and wrist flexion/ex  0/5, 4/5 RLE, 5/5 on left UE and LE  Assessment/Plan: 1. Functional deficits which require 3+ hours per day of interdisciplinary therapy in a comprehensive inpatient rehab setting. Physiatrist is providing close team supervision and 24 hour management of active medical problems listed below. Physiatrist and rehab team continue to assess barriers to discharge/monitor patient progress toward functional and medical goals  Care Tool:  Bathing    Body parts bathed by patient: Right arm, Chest, Abdomen, Front perineal area, Right upper leg, Left upper leg, Right lower leg, Left lower leg, Face   Body parts bathed by helper: Left  arm, Buttocks     Bathing assist Assist Level: Minimal Assistance - Patient > 75%     Upper Body Dressing/Undressing Upper body dressing   What is the patient wearing?: Pull over shirt    Upper body assist Assist Level: Minimal Assistance - Patient > 75%    Lower Body Dressing/Undressing Lower body dressing      What is the patient wearing?: Pants     Lower body assist Assist for lower body dressing: Contact Guard/Touching assist     Toileting Toileting    Toileting assist Assist for toileting: Contact Guard/Touching assist     Transfers Chair/bed transfer  Transfers assist     Chair/bed transfer assist level: Contact Guard/Touching assist     Locomotion Ambulation   Ambulation assist      Assist level: Contact Guard/Touching assist Assistive device: No Device Max distance: >357ft   Walk 10 feet activity   Assist     Assist level: Contact Guard/Touching assist Assistive device: No Device   Walk 50 feet activity   Assist    Assist level: Contact Guard/Touching assist Assistive device: No Device    Walk 150 feet activity   Assist    Assist level: Contact Guard/Touching assist Assistive device: No Device    Walk 10 feet on uneven surface  activity  Assist     Assist level: Minimal Assistance - Patient > 75%     Wheelchair     Assist Is the patient using a wheelchair?: Yes Type of Wheelchair: Manual    Wheelchair assist level: Total Assistance - Patient < 25% Max wheelchair distance: 50    Wheelchair 50 feet with 2 turns activity    Assist        Assist Level: Maximal Assistance - Patient 25 - 49%   Wheelchair 150 feet activity     Assist      Assist Level: Total Assistance - Patient < 25%   Blood pressure 124/88, pulse 93, temperature 98.3 F (36.8 C), resp. rate 15, height 5\' 8"  (1.727 m), weight 70.9 kg, SpO2 100 %.  Medical Problem List and Plan: 1.  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  Antithrombotics: -DVT/anticoagulation:  Mechanical: Antiembolism stockings, thigh (TED hose) Bilateral lower extremities             -antiplatelet therapy: Aspirin 325 mg daily and Plavix 75 mg daily x3 months then aspirin alone 3. Pain Management: Tylenol as needed 4. Mood: Provide emotional support             -antipsychotic agents: N/A 5. Neuropsych: This patient is not capable of making decisions on her own behalf. 6. Skin/Wound Care: Routine skin checks 7. Fluids/Electrolytes/Nutrition: Routine in and outs 8.  Hypertension.  Continue Norvasc 2.5 mg daily.Her Cozaar 100 mg daily was held due to blood pressure being soft and resume as needed.  Monitor with increased mobility Vitals:   01/10/21 1249 01/11/21 0454  BP: 114/74 124/88  Pulse: 86 93  Resp: 17 15  Temp: 98.7 F (37.1 C) 98.3 F (36.8 C)  SpO2: 100% 100%   Controlled on 12/9 9.  Hyperlipidemia.  Continue Crestor 10.  History of remote GI bleed 20 years ago.  Continue Protonix. 11.  Slow transit constipation  Bowel meds increased on 12/4- cont BM this am    LOS: 9 days A FACE TO FACE EVALUATION WAS PERFORMED  Charlett Blake 01/11/2021, 9:52 AM

## 2021-01-11 NOTE — Plan of Care (Signed)
  Problem: RH Dressing Goal: LTG Patient will perform lower body dressing w/assist (OT) Description: LTG: Patient will perform lower body dressing with assist, with/without cues in positioning using equipment (OT) Flowsheets (Taken 01/11/2021 1011) LTG: Pt will perform lower body dressing with assistance level of: (Upgraded due to progress.) Supervision/Verbal cueing Note: Upgraded due to progress.   Problem: RH Toileting Goal: LTG Patient will perform toileting task (3/3 steps) with assistance level (OT) Description: LTG: Patient will perform toileting task (3/3 steps) with assistance level (OT)  Flowsheets (Taken 01/11/2021 1011) LTG: Pt will perform toileting task (3/3 steps) with assistance level: (Upgraded due to progress.) Supervision/Verbal cueing Note: Upgraded due to progress.   Problem: RH Toilet Transfers Goal: LTG Patient will perform toilet transfers w/assist (OT) Description: LTG: Patient will perform toilet transfers with assist, with/without cues using equipment (OT) Flowsheets (Taken 01/11/2021 1011) LTG: Pt will perform toilet transfers with assistance level of: (Upgraded due to progress.) Supervision/Verbal cueing Note: Upgraded due to progress.
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
## 2021-01-11 NOTE — Progress Notes (Addendum)
Speech Language Pathology Daily Session Note  Patient Details  Name: Natasha Vazquez MRN: [id number] Date of Birth: Jan 14, 1951  Today's Date: 01/11/2021 SLP Individual Time: 1000-1100 SLP Individual Time Calculation (min): 60 min  Short Term Goals: Week 2: SLP Short Term Goal 1 (Week 2): STG=LTG due to ELOS  Skilled Therapeutic Interventions: Skilled ST treatment focused on family education and language goals. Patient's daughter, granddaughter, and brother were present today and actively engaged with therapist and patient. SLP extensively educated on aphasia (expressive, receptive, reading, writing deficits), communication strategies, non verbal communication, communication aids, facilitating yes/no questions, supporting automatic speech, yes/no responses, and using visual supports. SLP also discussed recommendations for follow up speech therapy in home health setting and possibly transitioning to outpatient setting depending on how patient progresses with home health. Family verbalized understanding through teach back and demonstration throughout session. SLP also facilitated expressive/receptive language tasks this date. She responded to yes/no questions regarding needs and wishes with 75% effectiveness. She verbally repeated her brother's name "Nicole Kindred." Pt demonstrated significant progress with counting 1-10 to achieve 80% accuracy. She also engaged in singing opportunities where she verbalized ~15% of words accurately. Pt continues to perseverate on the word "talking" which limits functional verbal expression. Family confirmed all questions were addressed. Patient was left in bed with alarm activated and immediate needs within reach at end of session. Continue per current plan of care.      Pain Pain Assessment Pain Scale: 0-10 Pain Score: 0-No pain  Therapy/Group: Individual Therapy  Patty Sermons 01/11/2021, 12:43 PM
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
## 2021-01-12 MED ORDER — SORBITOL 70 % SOLN
30.0000 mL | Freq: Once | Status: AC
  Administered 2021-01-12: 30 mL via ORAL
  Filled 2021-01-12: qty 30

## 2021-01-12 NOTE — Progress Notes (Signed)
Slept good. LBM 12/06, taking scheduled Senna S 1 tab bid. Patient shook head no when offered PRN sorbitol.Denies pain. No attempts OOB thus far. Natasha Vazquez A

## 2021-01-12 NOTE — Progress Notes (Signed)
PROGRESS NOTE   Subjective/Complaints:   Pt signed that she needs the bathroom Denies pain- nodding head no.   ROS limited due to aphasia   Objective:   No results found. No results for input(s): WBC, HGB, HCT, PLT in the last 72 hours.  No results for input(s): NA, K, CL, CO2, GLUCOSE, BUN, CREATININE, CALCIUM in the last 72 hours.   Intake/Output Summary (Last 24 hours) at 01/12/2021 0956 Last data filed at 01/12/2021 0714 Gross per 24 hour  Intake 600 ml  Output --  Net 600 ml        Physical Exam: Vital Signs Blood pressure 107/84, pulse 77, temperature 98 F (36.7 C), resp. rate 17, height 5\' 8"  (1.727 m), weight 70.9 kg, SpO2 100 %.    General: awake, alert, appropriate,sitting up in bed;  NAD HENT: conjugate gaze; oropharynx moist; R facial droop CV: regular rate; no JVD Pulmonary: CTA B/L; no W/R/R- good air movement GI: soft, NT, ND, (+)BS Psychiatric: appropriate Neurological: apraxic, aphasic- but nodding head yes/no  Right facial weakness Mixed speech apraxia/Global aphasia Motor: Limited due to aphasia, RUE shoulder abduction, bideps and triceps 3/5, handgrip and wrist flexion/ex  0/5, 4/5 RLE, 5/5 on left UE and LE  Assessment/Plan: 1. Functional deficits which require 3+ hours per day of interdisciplinary therapy in a comprehensive inpatient rehab setting. Physiatrist is providing close team supervision and 24 hour management of active medical problems listed below. Physiatrist and rehab team continue to assess barriers to discharge/monitor patient progress toward functional and medical goals  Care Tool:  Bathing    Body parts bathed by patient: Right arm, Chest, Abdomen, Front perineal area, Right upper leg, Left upper leg, Right lower leg, Left lower leg, Face, Buttocks   Body parts bathed by helper: Left arm     Bathing assist Assist Level: Minimal Assistance - Patient > 75%      Upper Body Dressing/Undressing Upper body dressing   What is the patient wearing?: Pull over shirt    Upper body assist Assist Level: Minimal Assistance - Patient > 75%    Lower Body Dressing/Undressing Lower body dressing      What is the patient wearing?: Pants     Lower body assist Assist for lower body dressing: Contact Guard/Touching assist     Toileting Toileting    Toileting assist Assist for toileting: Contact Guard/Touching assist     Transfers Chair/bed transfer  Transfers assist     Chair/bed transfer assist level: Contact Guard/Touching assist     Locomotion Ambulation   Ambulation assist      Assist level: Contact Guard/Touching assist Assistive device: No Device Max distance: >361ft   Walk 10 feet activity   Assist     Assist level: Contact Guard/Touching assist Assistive device: No Device   Walk 50 feet activity   Assist    Assist level: Contact Guard/Touching assist Assistive device: No Device    Walk 150 feet activity   Assist    Assist level: Contact Guard/Touching assist Assistive device: No Device    Walk 10 feet on uneven surface  activity   Assist     Assist level: Minimal Assistance -  Patient > 75%     Wheelchair     Assist Is the patient using a wheelchair?: Yes Type of Wheelchair: Manual    Wheelchair assist level: Total Assistance - Patient < 25% Max wheelchair distance: 50    Wheelchair 50 feet with 2 turns activity    Assist        Assist Level: Maximal Assistance - Patient 25 - 49%   Wheelchair 150 feet activity     Assist      Assist Level: Total Assistance - Patient < 25%   Blood pressure 107/84, pulse 77, temperature 98 F (36.7 C), resp. rate 17, height 5\' 8"  (1.727 m), weight 70.9 kg, SpO2 100 %.  Medical Problem List and Plan: 1.  Right side weakness with mixed  aphasia as well as speech apraxia functional deficits secondary to left MCA infarction including Left  frontal , parietal and temporal lobes Continue CIR PT, OT ELOS 12/13- family training today  12/10- Continue CIR- PT, OT and SLP  WHO ordered 2.  Antithrombotics: -DVT/anticoagulation:  Mechanical: Antiembolism stockings, thigh (TED hose) Bilateral lower extremities             -antiplatelet therapy: Aspirin 325 mg daily and Plavix 75 mg daily x3 months then aspirin alone 3. Pain Management: Tylenol as needed 4. Mood: Provide emotional support             -antipsychotic agents: N/A 5. Neuropsych: This patient is not capable of making decisions on her own behalf. 6. Skin/Wound Care: Routine skin checks 7. Fluids/Electrolytes/Nutrition: Routine in and outs 8.  Hypertension.  Continue Norvasc 2.5 mg daily.Her Cozaar 100 mg daily was held due to blood pressure being soft and resume as needed.  Monitor with increased mobility Vitals:   01/11/21 1915 01/12/21 0506  BP: 107/71 107/84  Pulse: 85 77  Resp: 17 17  Temp: 97.8 F (36.6 C) 98 F (36.7 C)  SpO2: 100% 100%   12/10- BP a little soft, but asymptomatic- con't regimen 9.  Hyperlipidemia.  Continue Crestor 10.  History of remote GI bleed 20 years ago.  Continue Protonix. 11.  Slow transit constipation  Bowel meds increased on 12/4- cont BM this am   12/10- Lbm 12/6- will give Sorbitol this Afternoon since has been 4 days. Strongly encourage pt to take since has been 4 days.    LOS: 10 days A FACE TO FACE EVALUATION WAS PERFORMED  Galilee Pierron 01/12/2021, 9:56 AM
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
## 2021-01-12 NOTE — Progress Notes (Signed)
Speech Language Pathology Daily Session Note  Patient Details  Name: Natasha Vazquez MRN: [id number] Date of Birth: 14-Aug-1950  Today's Date: 01/12/2021 SLP Individual Time: 1005-1100 SLP Individual Time Calculation (min): 55 min  Short Term Goals: Week 2: SLP Short Term Goal 1 (Week 2): STG=LTG due to ELOS  Skilled Therapeutic Interventions:  Pt was seen for skilled ST targeting goals for communication.  Pt's brother and daughter were present for the duration of today's therapy session.  They had no remaining questions from yesterday's family training session although they had new questions arise as they observed SLP and pt complete various targeted language tasks.  Questions were related to activities that they could provide pt with at home to continue addressing her language goals and etiology of language deficits.  These were addressed to pt's family's satisfaction at this time.  Pt needed max to total assist to correct perseverative verbal errors when singing familiar songs and counting from 1-10.  She was able to match a word to an object from a field of three with ~75% accuracy with mod assist to recognize and correct errors.  SLP reinforced the importance of errorless learning when working on structured language tasks in the home environment to reduce perpetuation of perseverative errors.  Pt was left in recliner with family at bedside.  Continue per current plan of care.    Pain Pain Assessment Pain Scale: 0-10 Pain Score: 0-No pain  Therapy/Group: Individual Therapy  Natasha Vazquez, Natasha Vazquez 01/12/2021, 12:50 PM

## 2021-01-13 NOTE — Progress Notes (Signed)
Speech Language Pathology Daily Session Note  Patient Details  Name: Natasha Vazquez MRN: [id number] Date of Birth: 07/16/1950  Today's Date: 01/13/2021 SLP Individual Time: 0700-0800 SLP Individual Time Calculation (min): 60 min  Short Term Goals: Week 2: SLP Short Term Goal 1 (Week 2): STG=LTG due to ELOS  Skilled Therapeutic Interventions:   Pt was found on toilet, attempting to use the bathroom independently. Pt began crying. Pt allowed SLP to assist with toileting, dressing, and getting back to bed. Pt participated in [place] therapy with focus on expressive and receptive language. Pt was able to answer basic yes/no questions (is this an apple? Yes, or no?). She answered questions with 80% accuracy. When asked to ID the same items, pt was unable to complete in a F03. Pt imitated 1-step gestures with mod-to-max verbal cueing (with occasional hand-over-hand). She repeated the following words, while imitating gestures re: nose and touch. Attempting phrase completion for automatic speech. Pt was able to complete 2/10 phrases with single words given maxA (drive a _____, and brush your ___). Pt imitated the following phonemes re: /a/, /u/, and /m/. SLP provided articulatory placement cues and several repetitions. Pt was able to produce /a/ x3, /u/ x5, and /m/ x3. Pt was left in room, all immediate needs in reach, and bed alarm activated.   Pain Pain Assessment Pain Scale: Faces Pain Score: 0-No pain Faces Pain Scale: No hurt  Therapy/Group: Individual Therapy  Verdene Lennert, SLP 01/13/2021, 10:46 AM

## 2021-01-13 NOTE — Plan of Care (Signed)
  Problem: RH BOWEL ELIMINATION Goal: RH STG MANAGE BOWEL WITH ASSISTANCE Description: STG Manage Bowel with  mod I Assistance. Flowsheets (Taken 01/13/2021 0034) STG: Pt will manage bowels with assistance: 6-Modified independent Goal: RH STG MANAGE BOWEL W/MEDICATION W/ASSISTANCE Description: STG Manage Bowel with Medication with mod I Assistance. Flowsheets (Taken 01/13/2021 0034) STG: Pt will manage bowels with medication with assistance: 5-Supervision/set up   Problem: RH SAFETY Goal: RH STG ADHERE TO SAFETY PRECAUTIONS W/ASSISTANCE/DEVICE Description: STG Adhere to Safety Precautions With  cues Assistance/Device. Flowsheets (Taken 01/13/2021 0034) STG:Pt will adhere to safety precautions with assistance/device: 4-Minimal assistance
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
## 2021-01-13 NOTE — Discharge Summary (Signed)
Physician Discharge Summary  Patient ID: Natasha Vazquez MRN: [id number] DOB/AGE: [birth year] 70 y.o.  Admit date: 01/02/2021 Discharge date: 01/15/2021  Discharge Diagnoses:  Principal Problem:   Left middle cerebral artery stroke [facility]) Active Problems:   Slow transit constipation   Dyslipidemia DVT prophylaxis Constipation Remote GI bleed 20 years ago   Discharged Condition: Stable  Significant Diagnostic Studies: CT ANGIO HEAD NECK W WO CM  Result Date: 12/25/2020 CLINICAL DATA:  Altered mental status EXAM: CT ANGIOGRAPHY HEAD AND NECK TECHNIQUE: Multidetector CT imaging of the head and neck was performed using the standard protocol during bolus administration of intravenous contrast. Multiplanar CT image reconstructions and MIPs were obtained to evaluate the vascular anatomy. Carotid stenosis measurements (when applicable) are obtained utilizing NASCET criteria, using the distal internal carotid diameter as the denominator. CONTRAST:  139mL OMNIPAQUE IOHEXOL 350 MG/ML SOLN COMPARISON:  No prior CTA, correlation is made with 08/12/2016 MRI and MRA head FINDINGS: CT HEAD FINDINGS Brain: Hypodensity in the left posterior frontal lobe cortex and white matter (series 5, image 20). No acute hemorrhage, mass, mass effect, or midline shift. Parenchymal calcifications in the left temporal and posterior frontal lobe, which may be vascular calcifications. Vascular: No hyperdense vessel. Calcifications along the expected course of the left MCA. Skull: Normal. Negative for fracture or focal lesion. Sinuses: Small osteoma in the left ethmoid air cells. Otherwise negative. Orbits: Status post bilateral lens replacements. Review of the MIP images confirms the above findings CTA NECK FINDINGS Aortic arch: Standard branching. Imaged portion shows no evidence of aneurysm or dissection. No significant stenosis of the major arch vessel origins. Aortic atherosclerosis. Right carotid system: No evidence of  dissection, stenosis (50% or greater) or occlusion. Left carotid system: No evidence of dissection, stenosis (50% or greater) or occlusion. Vertebral arteries: Codominant. No evidence of dissection, stenosis (50% or greater) or occlusion. Skeleton: No acute osseous abnormality. Degenerative changes in the cervical spine and right greater than left temporomandibular joints. Other neck: Subcentimeter right thyroid lobe nodule. Otherwise negative Upper chest: Right greater than left apical pleuroparenchymal scarring. No focal pulmonary opacity or pleural effusion. Review of the MIP images confirms the above findings CTA HEAD FINDINGS Anterior circulation: Both internal carotid arteries are patent to the termini, without stenosis or other abnormality. A1 segments patent, with multifocal narrowing of the right A1. Normal anterior communicating artery. Anterior cerebral arteries are patent to their distal aspects. No M1 stenosis or occlusion, although calcifications are noted in the left M1 segment. Normal MCA bifurcations. Distal MCA branches perfused with moderate irregularity bilaterally and calcifications in the left MCA branches. Posterior circulation: Vertebral arteries patent to the vertebrobasilar junction without stenosis. Posterior inferior cerebral arteries patent bilaterally. Basilar patent to its distal aspect. Superior cerebellar arteries patent bilaterally. PCAs perfused to their distal aspects without focal stenosis. Venous sinuses: As permitted by contrast timing, patent. Anatomic variants: None significant. Review of the MIP images confirms the above findings IMPRESSION: 1. Hypodensity in the posterior left frontal lobe, which is concerning for an acute or subacute infarct. 2. No intracranial large vessel occlusion. Multifocal calcifications in the left MCA, likely with multifocal stenosis. 3. Multifocal narrowing of the right A1 and bilateral distal MCA branches. 4. No hemodynamically significant  stenosis in the neck. 5. Subcentimeter incidental thyroid nodule. No follow-up imaging is recommended. Reference: J Am Coll Radiol. 2015 Feb;12(2): 143-50 6. Aortic Atherosclerosis (ICD10-I70.0). These results were called by telephone at the time of interpretation on 12/25/2020 at 11:29 pm to provider JON  KNAPP , who verbally acknowledged these results. Electronically Signed   By: Merilyn Baba M.D.   On: 12/25/2020 23:29   MR BRAIN WO CONTRAST  Result Date: 12/26/2020 CLINICAL DATA:  Neuro deficit, acute, stroke suspected EXAM: MRI HEAD WITHOUT CONTRAST TECHNIQUE: Multiplanar, multiecho pulse sequences of the brain and surrounding structures were obtained without intravenous contrast. COMPARISON:  December 25, 2020 CT/CTA.  MRI 08/12/2016. FINDINGS: Brain: Extensive acute infarcts throughout the left cerebrum, including left frontal, parietal, and temporal lobes and left basal ganglia. Associated edema without significant mass effect. No midline shift. No hydrocephalus, mass lesion, or extra-axial fluid collection. Additional moderate scattered T2/FLAIR hyperintensities in the white matter, nonspecific but compatible with chronic microvascular ischemic disease. Small amount of curvilinear susceptibility artifact in the left frontal parietal region in the region of acute infarct, likely petechial hemorrhage. No mass occupying hemorrhage. Vascular: Major arterial flow voids are maintained at the skull base. Further evaluated on CTA from December 25, 2020. Skull and upper cervical spine: Normal marrow signal. Sinuses/Orbits: Clear sinuses.  Unremarkable orbits. Other: No sizable mastoid effusions. IMPRESSION: 1. Extensive acute infarcts throughout the left cerebrum, including left frontal, parietal, and temporal lobes and left basal ganglia. Mild petechial hemorrhage without mass occupying hemorrhagic transformation. Edema without significant mass effect. 2. Moderate chronic microvascular ischemic disease.  Electronically Signed   By: Margaretha Sheffield M.D.   On: 12/26/2020 12:18   ECHOCARDIOGRAM COMPLETE  Result Date: 12/26/2020    ECHOCARDIOGRAM REPORT   Patient Name:   Natasha Vazquez Date of Exam: 12/26/2020 Medical Rec #:  [id number]      Height:       68.0 in Accession #:    [id number]     Weight:       169.3 lb Date of Birth:  03-04-1950     BSA:          1.904 m Patient Age:    63 years       BP:           156/78 mmHg Patient Gender: F              HR:           97 bpm. Exam Location:  [facility] Procedure: 2D Echo, Cardiac Doppler and Color Doppler Indications:    Stroke  History:        Patient has no prior history of Echocardiogram examinations.                 Stroke; Risk Factors:Hypertension and Dyslipidemia.  Sonographer:    Wenda Low Referring Phys: [id number] OLADAPO ADEFESO IMPRESSIONS  1. Left ventricular ejection fraction, by estimation, is 65 to 70%. The left ventricle has normal function. The left ventricle has no regional wall motion abnormalities. Left ventricular diastolic parameters are consistent with Grade I diastolic dysfunction (impaired relaxation).  2. Right ventricular systolic function is normal. The right ventricular size is normal. There is normal pulmonary artery systolic pressure.  3. The mitral valve is normal in structure. No evidence of mitral valve regurgitation. No evidence of mitral stenosis.  4. The aortic valve is tricuspid. Aortic valve regurgitation is not visualized. No aortic stenosis is present.  5. The inferior vena cava is normal in size with greater than 50% respiratory variability, suggesting right atrial pressure of 3 mmHg. FINDINGS  Left Ventricle: Left ventricular ejection fraction, by estimation, is 65 to 70%. The left ventricle has normal function. The left ventricle has no regional wall motion abnormalities.  The left ventricular internal cavity size was normal in size. There is  no left ventricular hypertrophy. Left ventricular diastolic parameters  are consistent with Grade I diastolic dysfunction (impaired relaxation). Normal left ventricular filling pressure. Right Ventricle: The right ventricular size is normal. No increase in right ventricular wall thickness. Right ventricular systolic function is normal. There is normal pulmonary artery systolic pressure. The tricuspid regurgitant velocity is 2.28 m/s, and  with an assumed right atrial pressure of 3 mmHg, the estimated right ventricular systolic pressure is 82.5 mmHg. Left Atrium: Left atrial size was normal in size. Right Atrium: Right atrial size was normal in size. Pericardium: There is no evidence of pericardial effusion. Mitral Valve: The mitral valve is normal in structure. No evidence of mitral valve regurgitation. No evidence of mitral valve stenosis. MV peak gradient, 6.8 mmHg. The mean mitral valve gradient is 3.0 mmHg. Tricuspid Valve: The tricuspid valve is normal in structure. Tricuspid valve regurgitation is not demonstrated. No evidence of tricuspid stenosis. Aortic Valve: The aortic valve is tricuspid. Aortic valve regurgitation is not visualized. No aortic stenosis is present. Aortic valve mean gradient measures 4.0 mmHg. Aortic valve peak gradient measures 7.7 mmHg. Aortic valve area, by VTI measures 2.84 cm. Pulmonic Valve: The pulmonic valve was not well visualized. Pulmonic valve regurgitation is not visualized. No evidence of pulmonic stenosis. Aorta: The aortic root is normal in size and structure. Venous: The inferior vena cava is normal in size with greater than 50% respiratory variability, suggesting right atrial pressure of 3 mmHg. IAS/Shunts: No atrial level shunt detected by color flow Doppler.  LEFT VENTRICLE PLAX 2D LVIDd:         4.20 cm     Diastology LVIDs:         2.40 cm     LV e' medial:    10.40 cm/s LV PW:         1.10 cm     LV E/e' medial:  9.1 LV IVS:        1.00 cm     LV e' lateral:   13.60 cm/s LVOT diam:     2.00 cm     LV E/e' lateral: 7.0 LV SV:         82  LV SV Index:   43 LVOT Area:     3.14 cm  LV Volumes (MOD) LV vol d, MOD A2C: 50.0 ml LV vol d, MOD A4C: 66.8 ml LV vol s, MOD A2C: 13.2 ml LV vol s, MOD A4C: 24.0 ml LV SV MOD A2C:     36.8 ml LV SV MOD A4C:     66.8 ml LV SV MOD BP:      40.7 ml RIGHT VENTRICLE RV Basal diam:  2.50 cm RV Mid diam:    2.10 cm RV S prime:     13.60 cm/s TAPSE (M-mode): 2.5 cm LEFT ATRIUM             Index        RIGHT ATRIUM           Index LA diam:        3.40 cm 1.79 cm/m   RA Area:     11.70 cm LA Vol (A2C):   38.4 ml 20.17 ml/m  RA Volume:   25.30 ml  13.29 ml/m LA Vol (A4C):   39.6 ml 20.80 ml/m LA Biplane Vol: 39.1 ml 20.54 ml/m  AORTIC VALVE  PULMONIC VALVE AV Area (Vmax):    2.80 cm     PV Vmax:       0.92 m/s AV Area (Vmean):   2.71 cm     PV Peak grad:  3.4 mmHg AV Area (VTI):     2.84 cm AV Vmax:           139.00 cm/s AV Vmean:          99.100 cm/s AV VTI:            0.288 m AV Peak Grad:      7.7 mmHg AV Mean Grad:      4.0 mmHg LVOT Vmax:         124.00 cm/s LVOT Vmean:        85.500 cm/s LVOT VTI:          0.260 m LVOT/AV VTI ratio: 0.90  AORTA Ao Root diam: 2.70 cm MITRAL VALVE                TRICUSPID VALVE MV Area (PHT): 4.49 cm     TR Peak grad:   20.8 mmHg MV Area VTI:   3.73 cm     TR Vmax:        228.00 cm/s MV Peak grad:  6.8 mmHg MV Mean grad:  3.0 mmHg     SHUNTS MV Vmax:       1.30 m/s     Systemic VTI:  0.26 m MV Vmean:      82.5 cm/s    Systemic Diam: 2.00 cm MV Decel Time: 169 msec MV E velocity: 94.60 cm/s MV A velocity: 121.00 cm/s MV E/A ratio:  0.78 Carlyle Dolly MD Electronically signed by Carlyle Dolly MD Signature Date/Time: 12/26/2020/11:20:27 AM    Final     Labs:  Basic Metabolic Panel: No results for input(s): NA, K, CL, CO2, GLUCOSE, BUN, CREATININE, CALCIUM, MG, PHOS in the last 168 hours.  CBC: No results for input(s): WBC, NEUTROABS, HGB, HCT, MCV, PLT in the last 168 hours.  CBG: No results for input(s): GLUCAP in the last 168 hours.  Family  history.  Positive for hypertension as well as hyperlipidemia.  Denies any colon cancer esophageal cancer or rectal cancer  Brief HPI:   Natasha Vazquez is a 70 y.o. right-handed female with history of hypertension GI bleed as well as hyperlipidemia.  Per chart review lives alone independent household ambulator.  She does not drive.  Presented to [facility] 12/25/2020 with right side weakness aphasia and altered mental status.  Blood pressure was elevated 178/73.  Admission chemistries unremarkable save alcohol negative glucose 147.  CT angiogram head and neck hypodensity in the posterior left frontal lobe concerning for acute/subacute infarction.  No intracranial large vessel occlusion.  Multifocal narrowing of the right A1 and bilateral distal MCA branches.  No hemodynamically significant stenosis in the neck.  MRI of the brain extensive acute infarcts throughout the left cerebrum including left frontal parietal and temporal lobes and left basal ganglia.  There was mild petechial hemorrhage.  Patient did not receive tPA.  Echocardiogram with ejection fraction of 65 to 70% no regional wall motion abnormalities grade 1 diastolic dysfunction.  Neurology follow-up maintained on aspirin 325 mg daily and Plavix 75 mg daily for CVA prophylaxis x3 months then aspirin alone.  Therapy evaluations completed due to patient's right side weakness and dysarthric speech was admitted for a comprehensive rehab program.   Hospital Course: Natasha Vazquez was admitted  to rehab 01/02/2021 for inpatient therapies to consist of PT, ST and OT at least three hours five days a week. Past admission physiatrist, therapy team and rehab RN have worked together to provide customized collaborative inpatient rehab.  Pertaining to patient's left MCA infarction including left frontal parietal temporal lobes remained stable she continued on aspirin 325 mg daily and Plavix 85 mg day x3 months then aspirin alone.  No bleeding episodes.   She would follow neurology services.  Blood pressure somewhat soft maintained on Norvasc her Cozaar was held she would need outpatient follow-up.  Crestor ongoing for hyperlipidemia.  History of remote GI bleed 20 years ago maintained on Protonix again noted no bleeding episodes.  Slow transit constipation resolved with laxative assistance.   Blood pressures were monitored on TID basis and soft and monitored     Rehab course: During patient's stay in rehab weekly team conferences were held to monitor patient's progress, set goals and discuss barriers to discharge. At admission, patient required minimal guard 75 feet minimal guard sit to supine  Physical exam.  Blood pressure 140/72 pulse 83 temperature 98.5 respirations 16 oxygen saturations 100% room air Constitutional.  No acute distress HEENT Head.  Normocephalic and atraumatic Eyes.  Pupils round and reactive to light no discharge.nystagmus Neck.  Supple nontender no JVD without thyromegaly Cardiac regular rate rhythm any extra sounds or murmur heard Abdomen.  Soft nontender positive bowel sounds without rebound Respiratory effort normal no respiratory distress without wheeze Skin.  Warm and dry Neurologic.  Alert expressive receptive aphasia mostly says no.  Appears to be able to understand conversation and will try to follow commands.  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 He/She has had improvement in functional use RUE/LUE  and RLE/LLE as well as improvement in awareness.  Patient ambulating 225 feet supervision intermittent awareness of right upper extremity.  Car transfer training without assistive device and supervision.  ADLs sessions focused on activity tolerance improved ADL functional mobility.  Patient able to write her first and last name  and names of her daughter and brother with minimal assist.  Speech therapy follow-up for aphasia as well as communication facilitated expressive receptive language tasks and education with the family.  She responded to yes/no questions regarding needs and wishes 75% effective.  Full family teaching completed plan discharged to home       Disposition: Discharged home    Diet: Mechanical soft  Special Instructions: No driving smoking or alcohol  Continue aspirin 325 mg daily and Plavix 75 mg day x3 months total then aspirin alone  Medications at discharge  1.  Tylenol as needed 2.  Norvasc 2.5 mg p.o. daily 3.  Aspirin 325 mg p.o. daily 4.  Plavix 75 mg p.o. daily 5.  Protonix 40 mg p.o. daily 6.  Crestor 20 mg p.o. daily  30-35 minutes were spent completing discharge summary and discharge planning  Discharge Instructions     Ambulatory referral to Physical Medicine Rehab   Complete by: As directed    Moderate complexity follow-up 1 to 2 weeks left MCA infarction        Follow-up Information     Kirsteins, Luanna Salk, MD Follow up.   Specialty: Physical Medicine and Rehabilitation Why: Office to call for appointment Contact information: [place] [address]  [phone]         Phillips Odor, MD Follow up.   Specialty: Neurology Why: call for appointment Contact information: [address] [address] [phone]                 Signed: Lavon Paganini [place] 01/15/2021, 5:12 AM

## 2021-01-13 NOTE — Progress Notes (Signed)
Slept good. Impulsive at times. Expressive aphasia, yes/no's not always accurate. Denies pain. No BM after sorbitol given yesterday. Will give another dose this morning. Patrici Ranks A

## 2021-01-14 ENCOUNTER — Other Ambulatory Visit (HOSPITAL_COMMUNITY): Payer: Self-pay

## 2021-01-14 MED ORDER — CLOPIDOGREL BISULFATE 75 MG PO TABS
75.0000 mg | ORAL_TABLET | Freq: Every day | ORAL | 0 refills | Status: AC
  Filled 2021-01-14: qty 71, 71d supply, fill #0

## 2021-01-14 MED ORDER — FISH OIL 1000 MG PO CAPS
1.0000 | ORAL_CAPSULE | Freq: Every day | ORAL | 0 refills | Status: AC
  Filled 2021-01-14: qty 30, 30d supply, fill #0

## 2021-01-14 MED ORDER — CHOLECALCIFEROL 25 MCG (1000 UT) PO TABS
1000.0000 [IU] | ORAL_TABLET | Freq: Every day | ORAL | 0 refills | Status: DC
  Filled 2021-01-14: qty 30, 30d supply, fill #0

## 2021-01-14 MED ORDER — ASPIRIN 325 MG PO TABS: 325.0000 mg | ORAL_TABLET | Freq: Every day | ORAL | Status: DC

## 2021-01-14 MED ORDER — PANTOPRAZOLE SODIUM 40 MG PO TBEC
40.0000 mg | DELAYED_RELEASE_TABLET | Freq: Every day | ORAL | 0 refills | Status: DC
  Filled 2021-01-14: qty 30, 30d supply, fill #0

## 2021-01-14 MED ORDER — AMLODIPINE BESYLATE 2.5 MG PO TABS
2.5000 mg | ORAL_TABLET | Freq: Every day | ORAL | 0 refills | Status: DC
  Filled 2021-01-14: qty 30, 30d supply, fill #0

## 2021-01-14 MED ORDER — ROSUVASTATIN CALCIUM 20 MG PO TABS
20.0000 mg | ORAL_TABLET | Freq: Every day | ORAL | 0 refills | Status: AC
  Filled 2021-01-14: qty 30, 30d supply, fill #0

## 2021-01-14 MED ORDER — ACETAMINOPHEN 325 MG PO TABS: 650.0000 mg | ORAL_TABLET | ORAL | Status: AC | PRN

## 2021-01-14 NOTE — Progress Notes (Signed)
Inpatient Rehabilitation Discharge Medication Review by a Pharmacist  A complete drug regimen review was completed for this patient to identify any potential clinically significant medication issues.  High Risk Drug Classes Is patient taking? Indication by Medication  Antipsychotic No   Anticoagulant No   Antibiotic No   Opioid No   Antiplatelet Yes Aspirin/Clopidogrel- secondary CVA proph prevention  Hypoglycemics/insulin No   Vasoactive Medication Yes Amlodipine- blood pressure  Chemotherapy No   Other Yes Crestor- HLD Protonix- GERD     Type of Medication Issue Identified Description of Issue Recommendation(s)  Drug Interaction(s) (clinically significant)     Duplicate Therapy  Aspirin/clopidogrel DAP therapy to continue for a total of 3 months and then aspirin alone thereafter. End date for clopidogrel: March 20, 2021  Allergy     No Medication Administration End Date     Incorrect Dose     Additional Drug Therapy Needed     Significant med changes from prior encounter (inform family/care partners about these prior to discharge).    Other        Clinically significant medication issues were identified that warrant physician communication and completion of prescribed/recommended actions by midnight of the next day:  No  Pharmacists comments:    Time spent performing this drug regimen review (minutes):  [address], PharmD, [place], AAHIVP, CPP Infectious Disease Pharmacist 01/14/2021 9:30 AM
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
## 2021-01-14 NOTE — Progress Notes (Signed)
Physical Therapy Discharge Summary  Patient Details  Name: Natasha Vazquez MRN: [id number] Date of Birth: 09-02-1950  Today's Date: 01/14/2021 PT Individual Time: 1101-1157 PT Individual Time Calculation (min): 56 min    Patient has met 8 of 8 long term goals due to improved activity tolerance, improved balance, improved postural control, increased strength, functional use of  right upper extremity and right lower extremity, improved awareness, and improved coordination.  Patient to discharge at an ambulatory level Modified Independent.   Patient's care partner is independent to provide the necessary physical and cognitive assistance at discharge.  Reasons goals not met: n/a  Recommendation:  Patient will benefit from ongoing skilled PT services in outpatient setting to continue to advance safe functional mobility, address ongoing impairments in strength, coordination, balance, activity tolerance, cognition, safety awareness, and to minimize fall risk.  Equipment: No equipment provided  Reasons for discharge: treatment goals met and discharge from hospital  Patient/family agrees with progress made and goals achieved: Yes  PT Discharge Precautions/Restrictions Precautions Precautions: Fall Precaution Comments: R hemi UE>LE, RUE physical neglect, aphasic expressive > receptive Restrictions Weight Bearing Restrictions: No Vital Signs   Pain Pain Assessment Pain Scale: 0-10 Pain Score: 0-No pain Pain Interference Pain Interference Pain Effect on Sleep: 1. Rarely or not at all Pain Interference with Therapy Activities: 1. Rarely or not at all Pain Interference with Day-to-Day Activities: 1. Rarely or not at all Vision/Perception  Vision - History Ability to See in Adequate Light:  (unable to state 2/2 global aphasia) Vision - Assessment Tracking/Visual Pursuits: Able to track stimulus in all quads without difficulty Perception Perception: Impaired Inattention/Neglect: Does  not attend to right side of body Praxis Praxis: Impaired (but much improved from initial eval)  Cognition Overall Cognitive Status: Difficult to assess (difficult to assess 2/2 global aphasia) Arousal/Alertness: Awake/alert Orientation Level: Oriented to person;Oriented to place (difficult to assess d/t global aphasia) Attention: Focused;Sustained Focused Attention: Impaired Sustained Attention: Appears intact Selective Attention: Appears intact Problem Solving: Impaired Self Monitoring: Impaired Safety/Judgment: Impaired Sensation Sensation Light Touch: Impaired Detail Peripheral sensation comments: RUE> RLE hemi Light Touch Impaired Details: Impaired RUE;Impaired RLE Additional Comments: RUE > RLE hemipareisis difficult to fully assess 2/2 global aphasia Coordination Gross Motor Movements are Fluid and Coordinated: No Fine Motor Movements are Fluid and Coordinated: No Heel Shin Test: able to perform with visual demonstration bilaterally Motor  Motor Motor: Other (comment) Motor - Discharge Observations: R hemipareisis UE>LE  Mobility Bed Mobility Bed Mobility: Supine to Sit;Sit to Supine Supine to Sit: Supervision/Verbal cueing Sit to Supine: Supervision/Verbal cueing Transfers Transfers: Sit to Stand;Stand Pivot Transfers;Stand to Sit Sit to Stand: Supervision/Verbal cueing Stand to Sit: Supervision/Verbal cueing Stand Pivot Transfers: Supervision/Verbal cueing Stand Pivot Transfer Details: Verbal cues for technique;Verbal cues for precautions/safety Transfer (Assistive device): None Locomotion  Gait Ambulation: Yes Gait Assistance: Supervision/Verbal cueing;Contact Guard/Touching assist Gait Distance (Feet): 1032 Feet Assistive device: 1 person hand held assist Gait Assistance Details: Verbal cues for precautions/safety;Verbal cues for gait pattern Gait Gait: Yes Gait Pattern: Impaired Gait Pattern: Narrow base of support;Decreased hip/knee flexion - left Gait  velocity: Decreased Stairs / Additional Locomotion Stairs: Yes Stairs Assistance: Supervision/Verbal cueing Stair Management Technique: One rail Right Number of Stairs: 12 Height of Stairs: 6 Ramp: Contact Guard/touching assist Curb: Contact Guard/Touching assist Wheelchair Mobility Wheelchair Mobility: No  Trunk/Postural Assessment  Cervical Assessment Cervical Assessment: Within Functional Limits Thoracic Assessment Thoracic Assessment: Within Functional Limits Lumbar Assessment Lumbar Assessment: Within Functional Limits Postural Control Postural Control:  Deficits on evaluation (furniture walks in room)  Balance Balance Balance Assessed: Yes Standardized Balance Assessment Standardized Balance Assessment: Berg Balance Test;Timed Up and Go Test Berg Balance Test Sit to Stand: Able to stand without using hands and stabilize independently Standing Unsupported: Able to stand safely 2 minutes Sitting with Back Unsupported but Feet Supported on Floor or Stool: Able to sit safely and securely 2 minutes Stand to Sit: Sits safely with minimal use of hands Transfers: Able to transfer safely, minor use of hands Standing Unsupported with Eyes Closed: Able to stand 10 seconds with supervision Standing Ubsupported with Feet Together: Able to place feet together independently and stand for 1 minute with supervision From Standing, Reach Forward with Outstretched Arm: Can reach confidently >25 cm (10") From Standing Position, Pick up Object from Floor: Able to pick up shoe safely and easily From Standing Position, Turn to Look Behind Over each Shoulder: Looks behind from both sides and weight shifts well Turn 360 Degrees: Able to turn 360 degrees safely one side only in 4 seconds or less Standing Unsupported, Alternately Place Feet on Step/Stool: Able to stand independently and complete 8 steps >20 seconds Standing Unsupported, One Foot in Front: Able to plae foot ahead of the other  independently and hold 30 seconds Standing on One Leg: Able to lift leg independently and hold > 10 seconds Total Score: 51 Timed Up and Go Test TUG: Normal TUG Normal TUG (seconds): 12.2 (average of 3 trials) Static Sitting Balance Static Sitting - Balance Support: No upper extremity supported;Feet supported Static Sitting - Level of Assistance: 6: Modified independent (Device/Increase time) Dynamic Sitting Balance Dynamic Sitting - Balance Support: No upper extremity supported;During functional activity;Feet supported Dynamic Sitting - Level of Assistance: 5: Stand by assistance Sitting balance - Comments: Good seated EOB Static Standing Balance Static Standing - Balance Support: During functional activity;Right upper extremity supported Static Standing - Level of Assistance: 5: Stand by assistance Dynamic Standing Balance Dynamic Standing - Balance Support: Right upper extremity supported;During functional activity Dynamic Standing - Level of Assistance: 5: Stand by assistance Extremity Assessment  RLE Assessment RLE Assessment: Exceptions to [facility] General Strength Comments: grossly 4/5 to 4+/5 LLE Assessment LLE Assessment: Within Functional Limits General Strength Comments: grossly 4+/5 to 5/5 functionally  Treatment Session: Patient seated upright in w/c on entrance to room. Patient alert and agreeable to PT session.   Patient with no pain complaint throughout session.  Therapeutic Activity: Bed Mobility: Patient performed supine <> sit with Mod I. No vc/ tc required.  Transfers: Patient performed sit<>stand and stand pivot transfers throughout session with Mod I. Provided vc for continued safety awareness at home.  Gait Training:  Patient ambulated throughout unit at distances reaching 250 ft using close supervision with no AD. Demonstrated continued difficulty in determing direction as called and is correct 50% of time. Provided vc/ tc for increasing step height bilaterally  as pt continues to slide feet as she did PTA.  Guided in 6MWT with pt reaching 1,032 ft which is an increase of ~250 feet over previous attempt last week which demonstrates improvement in gait speed, balance, and awareness/ focus.  Neuromuscular Re-ed: NMR facilitated during session with focus on standing balance. Pt guided in completion of Berg balance, 5xSTS and TUG. Berg Balance demos significant increase since [facility] with total score of 51/ 56 demonstrating moderate risk for fall and significant improvement over previous scores.   TUG completed in 12.2 seconds (A score of >13.5 seconds indicates patient is at  a high fall risk. Pt educated on interpretation of their score)    5xSTS completed in 21.89 seconds - pt limited by receptive aphasia and need for guidance to start and perform. (A score of 15 seconds or greater indicates patient is at an increased risk for falls. Education provided to patient on interpretation of balance score)    NMR performed for improvements in motor control and coordination, balance, sequencing, judgement, and self confidence/ efficacy in performing all aspects of mobility at highest level of independence.   Pt educated to ambulate with family supervising at home and to take her time. Pt demos understanding. Patient seated  in recliner at end of session with brakes locked, belt alarm set, and all needs within reach.   Alger Simons PT, DPT 01/14/2021, 12:57 PM

## 2021-01-14 NOTE — Progress Notes (Signed)
Patient ID: Natasha Vazquez, female   DOB: 07-18-1950, 70 y.o.   MRN: [id number]  OP referral fax to OP at [facility]

## 2021-01-14 NOTE — Plan of Care (Signed)
  Problem: RH Balance Goal: LTG Patient will maintain dynamic standing with ADLs (OT) Description: LTG:  Patient will maintain dynamic standing balance with assist during activities of daily living (OT)  Outcome: Completed/Met   Problem: Sit to Stand Goal: LTG:  Patient will perform sit to stand in prep for activites of daily living with assistance level (OT) Description: LTG:  Patient will perform sit to stand in prep for activites of daily living with assistance level (OT) Outcome: Completed/Met   Problem: RH Grooming Goal: LTG Patient will perform grooming w/assist,cues/equip (OT) Description: LTG: Patient will perform grooming with assist, with/without cues using equipment (OT) Outcome: Completed/Met   Problem: RH Bathing Goal: LTG Patient will bathe all body parts with assist levels (OT) Description: LTG: Patient will bathe all body parts with assist levels (OT) Outcome: Completed/Met   Problem: RH Dressing Goal: LTG Patient will perform upper body dressing (OT) Description: LTG Patient will perform upper body dressing with assist, with/without cues (OT). Outcome: Completed/Met Goal: LTG Patient will perform lower body dressing w/assist (OT) Description: LTG: Patient will perform lower body dressing with assist, with/without cues in positioning using equipment (OT) Outcome: Completed/Met   Problem: RH Toileting Goal: LTG Patient will perform toileting task (3/3 steps) with assistance level (OT) Description: LTG: Patient will perform toileting task (3/3 steps) with assistance level (OT)  Outcome: Completed/Met   Problem: RH Functional Use of Upper Extremity Goal: LTG Patient will use RT/LT upper extremity as a (OT) Description: LTG: Patient will use right/left upper extremity as a stabilizer/gross assist/diminished/nondominant/dominant level with assist, with/without cues during functional activity (OT) Outcome: Completed/Met   Problem: RH Light Housekeeping Goal: LTG  Patient will perform light housekeeping w/assist (OT) Description: LTG: Patient will perform light housekeeping with assistance, with/without cues (OT). Outcome: Completed/Met   Problem: RH Toilet Transfers Goal: LTG Patient will perform toilet transfers w/assist (OT) Description: LTG: Patient will perform toilet transfers with assist, with/without cues using equipment (OT) Outcome: Completed/Met   Problem: RH Tub/Shower Transfers Goal: LTG Patient will perform tub/shower transfers w/assist (OT) Description: LTG: Patient will perform tub/shower transfers with assist, with/without cues using equipment (OT) Outcome: Completed/Met   Problem: RH Attention Goal: LTG Patient will demonstrate this level of attention during functional activites (OT) Description: LTG:  Patient will demonstrate this level of attention during functional activites  (OT) Outcome: Completed/Met

## 2021-01-14 NOTE — Plan of Care (Signed)
  Problem: RH Balance Goal: LTG Patient will maintain dynamic sitting balance (PT) Description: LTG:  Patient will maintain dynamic sitting balance with assistance during mobility activities (PT) Outcome: Completed/Met Flowsheets (Taken 01/03/2021 2336 by Lorie Phenix, PT) LTG: Pt will maintain dynamic sitting balance during mobility activities with:: Independent Goal: LTG Patient will maintain dynamic standing balance (PT) Description: LTG:  Patient will maintain dynamic standing balance with assistance during mobility activities (PT) Outcome: Completed/Met Flowsheets (Taken 01/03/2021 2336 by Lorie Phenix, PT) LTG: Pt will maintain dynamic standing balance during mobility activities with:: Independent with assistive device    Problem: RH Bed Mobility Goal: LTG Patient will perform bed mobility with assist (PT) Description: LTG: Patient will perform bed mobility with assistance, with/without cues (PT). Outcome: Completed/Met Flowsheets (Taken 01/03/2021 2336 by Lorie Phenix, PT) LTG: Pt will perform bed mobility with assistance level of: Independent with assistive device    Problem: RH Bed to Chair Transfers Goal: LTG Patient will perform bed/chair transfers w/assist (PT) Description: LTG: Patient will perform bed to chair transfers with assistance (PT). Outcome: Completed/Met Flowsheets (Taken 01/14/2021 1757) LTG: Pt will perform Bed to Chair Transfers with assistance level: Independent with assistive device    Problem: RH Car Transfers Goal: LTG Patient will perform car transfers with assist (PT) Description: LTG: Patient will perform car transfers with assistance (PT). Outcome: Completed/Met Flowsheets (Taken 01/14/2021 1757) LTG: Pt will perform car transfers with assist:: Set up assist    Problem: RH Ambulation Goal: LTG Patient will ambulate in controlled environment (PT) Description: LTG: Patient will ambulate in a controlled environment, # of feet with assistance  (PT). Outcome: Completed/Met Flowsheets (Taken 01/03/2021 2336 by Lorie Phenix, PT) LTG: Pt will ambulate in controlled environ  assist needed:: Supervision/Verbal cueing LTG: Ambulation distance in controlled environment: 236f with LRAD Note: Pt is ambulating community distances within hospital.  Goal: LTG Patient will ambulate in home environment (PT) Description: LTG: Patient will ambulate in home environment, # of feet with assistance (PT). Outcome: Completed/Met Flowsheets (Taken 01/03/2021 2336 by TLorie Phenix PT) LTG: Pt will ambulate in home environ  assist needed:: Supervision/Verbal cueing LTG: Ambulation distance in home environment: 581fwith LRAD   Problem: RH Stairs Goal: LTG Patient will ambulate up and down stairs w/assist (PT) Description: LTG: Patient will ambulate up and down # of stairs with assistance (PT) Outcome: Completed/Met Flowsheets Taken 01/14/2021 1757 by KrAlger SimonsPT LTG: Pt will ambulate up/down stairs assist needed:: Supervision/Verbal cueing Taken 01/03/2021 2336 by TuLorie PhenixPT LTG: Pt will  ambulate up and down number of stairs: 4 steps with 1 rail

## 2021-01-14 NOTE — Progress Notes (Signed)
Inpatient Rehabilitation Care Coordinator Discharge Note   Patient Details  Name: EVALENE VATH MRN: [id number] Date of Birth: 02-21-1950   Discharge location: Home  Length of Stay: 12 Days  Discharge activity level: sup/min  Home/community participation: daughter  Patient response WN:[id number] Literacy - How often do you need to have someone help you when you read instructions, pamphlets, or other written material from your doctor or pharmacy?: Often  Patient response GU:[id number] Isolation - How often do you feel lonely or isolated from those around you?: Patient unable to respond  Services provided included: SW, Pharmacy, TR, RN, CM, SLP, OT, PT, RD, MD  Financial Services:  Financial Services Utilized: [place]  Choices offered to/list presented to: daughter  Follow-up services arranged:  Outpatient    Outpatient Servicies: OP referral fax to OP at [facility]      Patient response to transportation need: Is the patient able to respond to transportation needs?: Yes In the past 12 months, has lack of transportation kept you from medical appointments or from getting medications?: No In the past 12 months, has lack of transportation kept you from meetings, work, or from getting things needed for daily living?: No    Comments (or additional information):  Patient/Family verbalized understanding of follow-up arrangements:  Yes  Individual responsible for coordination of the follow-up plan: joyce  Confirmed correct DME delivered: [name] 01/14/2021    [name]

## 2021-01-14 NOTE — Progress Notes (Signed)
Speech Language Pathology Daily Session Note  Patient Details  Name: [name] MRN: [id number] Date of Birth: 1950-07-30  Today's Date: 01/14/2021 SLP Individual Time: 1348-1430 SLP Individual Time Calculation (min): 42 min  Short Term Goals: Week 2: SLP Short Term Goal 1 (Week 2): STG=LTG due to ELOS  Skilled Therapeutic Interventions: Pt seen for skilled ST with focus on speech goals, pt up in recliner and motivated for therapeutic tasks. Pt receptive to ongoing discussion re: discharge tomorrow, home recommendations and ongoing ST in OP setting. Pt perseverating on "talking" "taken" and counting this date, speaking in a very animated way attempting to communicate throughout session with these 3 verbalizations despite max A multimodal cues. SLP providing errorless learning to attempt to increase pt awareness/self-correction during communication break down. Pt able to ID correct picture in field of 3 with 90% accuracy, field of 4 with 80% accuracy and field of 6 with 60% accuracy. Pt single jingle bells with ~25% accuracy in producing correct lyrics. Pt expressing "no" when asked if she is in any pain. Left in wheelchair with alarm belt activated and all needs within reach. Recommend OP ST services and 24/7 supervision/assist at discharge.  Pain Pain Assessment Pain Scale: 0-10 Pain Score: 0-No pain  Therapy/Group: Individual Therapy  Dewaine Conger 01/14/2021, 2:24 PM

## 2021-01-14 NOTE — Progress Notes (Signed)
Patient ID: Natasha Vazquez, female   DOB: 1950/06/06, 70 y.o.   MRN: [id number]  [place] ordered through [employer]

## 2021-01-14 NOTE — Progress Notes (Signed)
PROGRESS NOTE   Subjective/Complaints:  Remains aphasic , apraxic  ROS limited due to aphasia   Objective:   No results found. No results for input(s): WBC, HGB, HCT, PLT in the last 72 hours.  No results for input(s): NA, K, CL, CO2, GLUCOSE, BUN, CREATININE, CALCIUM in the last 72 hours.   Intake/Output Summary (Last 24 hours) at 01/14/2021 0926 Last data filed at 01/14/2021 9924 Gross per 24 hour  Intake 960 ml  Output --  Net 960 ml         Physical Exam: Vital Signs Blood pressure 140/81, pulse 90, temperature 98 F (36.7 C), temperature source Oral, resp. rate 18, height 5\' 8"  (1.727 m), weight 68.6 kg, SpO2 100 %.    General: No acute distress Mood and affect are appropriate Heart: Regular rate and rhythm no rubs murmurs or extra sounds Lungs: Clear to auscultation, breathing unlabored, no rales or wheezes Abdomen: Positive bowel sounds, soft nontender to palpation, nondistended Extremities: No clubbing, cyanosis, or edema Skin: No evidence of breakdown, no evidence of rash Right facial weakness Mixed speech apraxia/Global aphasia Motor: Limited due to aphasia, RUE shoulder abduction, bideps and triceps 3/5, handgrip and wrist flexion/ex  0/5, 4/5 RLE, 5/5 on left UE and LE  Assessment/Plan: 1. Functional deficits which require 3+ hours per day of interdisciplinary therapy in a comprehensive inpatient rehab setting. Physiatrist is providing close team supervision and 24 hour management of active medical problems listed below. Physiatrist and rehab team continue to assess barriers to discharge/monitor patient progress toward functional and medical goals  Care Tool:  Bathing    Body parts bathed by patient: Right arm, Chest, Abdomen, Front perineal area, Right upper leg, Left upper leg, Right lower leg, Left lower leg, Face, Buttocks   Body parts bathed by helper: Left arm     Bathing assist  Assist Level: Minimal Assistance - Patient > 75%     Upper Body Dressing/Undressing Upper body dressing   What is the patient wearing?: Pull over shirt    Upper body assist Assist Level: Supervision/Verbal cueing    Lower Body Dressing/Undressing Lower body dressing      What is the patient wearing?: Pants, Underwear/pull up     Lower body assist Assist for lower body dressing: Supervision/Verbal cueing     Toileting Toileting    Toileting assist Assist for toileting: Contact Guard/Touching assist     Transfers Chair/bed transfer  Transfers assist     Chair/bed transfer assist level: Contact Guard/Touching assist     Locomotion Ambulation   Ambulation assist      Assist level: Contact Guard/Touching assist Assistive device: No Device Max distance: >371ft   Walk 10 feet activity   Assist     Assist level: Contact Guard/Touching assist Assistive device: No Device   Walk 50 feet activity   Assist    Assist level: Contact Guard/Touching assist Assistive device: No Device    Walk 150 feet activity   Assist    Assist level: Contact Guard/Touching assist Assistive device: No Device    Walk 10 feet on uneven surface  activity   Assist     Assist level: Minimal Assistance -  Patient > 75%     Wheelchair     Assist Is the patient using a wheelchair?: Yes Type of Wheelchair: Manual    Wheelchair assist level: Total Assistance - Patient < 25% Max wheelchair distance: 50    Wheelchair 50 feet with 2 turns activity    Assist        Assist Level: Maximal Assistance - Patient 25 - 49%   Wheelchair 150 feet activity     Assist      Assist Level: Total Assistance - Patient < 25%   Blood pressure 140/81, pulse 90, temperature 98 F (36.7 C), temperature source Oral, resp. rate 18, height 5\' 8"  (1.727 m), weight 68.6 kg, SpO2 100 %.  Medical Problem List and Plan: 1.  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  Antithrombotics: -DVT/anticoagulation:  Mechanical: Antiembolism stockings, thigh (TED hose) Bilateral lower extremities             -antiplatelet therapy: Aspirin 325 mg daily and Plavix 75 mg daily x3 months then aspirin alone 3. Pain Management: Tylenol as needed 4. Mood: Provide emotional support             -antipsychotic agents: N/A 5. Neuropsych: This patient is not capable of making decisions on her own behalf. 6. Skin/Wound Care: Routine skin checks 7. Fluids/Electrolytes/Nutrition: Routine in and outs 8.  Hypertension.  Continue Norvasc 2.5 mg daily.Her Cozaar 100 mg daily was held due to blood pressure being soft and resume as needed.  Monitor with increased mobility Vitals:   01/13/21 1936 01/14/21 0515  BP: (!) 147/77 140/81  Pulse: 80 90  Resp: 17 18  Temp: 98.2 F (36.8 C) 98 F (36.7 C)  SpO2: 100% 100%   12/12- controlled  9.  Hyperlipidemia.  Continue Crestor 10.  History of remote GI bleed 20 years ago.  Continue Protonix. 11.  Slow transit constipation  Bowel meds increased on 12/4- cont BM this am   12/10- Lbm 12/6- will give Sorbitol this Afternoon since has been 4 days. Strongly encourage pt to take since has been 4 days.    LOS: 12 days A FACE TO FACE EVALUATION WAS PERFORMED  Natasha Vazquez 01/14/2021, 9:26 AM

## 2021-01-14 NOTE — Progress Notes (Signed)
Occupational Therapy Discharge Summary  Patient Details  Name: Natasha Vazquez MRN: [id number] Date of Birth: [birth year]     Patient has met 12 of 12 long term goals due to improved activity tolerance, improved balance, postural control, ability to compensate for deficits, functional use of  RIGHT upper extremity, improved attention, improved awareness, and improved coordination.  Patient to discharge at overall Supervision level.  Patient's care partner is independent to provide the necessary physical and cognitive assistance at discharge.    Reasons goals not met: NA  Recommendation:  Patient will benefit from ongoing skilled OT services in home health setting to continue to advance functional skills in the area of BADL, iADL, and Reduce care partner burden.  Equipment: No equipment provided  Reasons for discharge: treatment goals met and discharge from hospital  Patient/family agrees with progress made and goals achieved: Yes  OT Discharge Precautions/Restrictions  Precautions Precautions: Fall Precaution Comments: R hemi UE>LE, RUE physical neglect, aphasic expressive > receptive Restrictions Weight Bearing Restrictions: No  ADL ADL Eating: Set up Where Assessed-Eating: Bed level, Chair Grooming: Supervision/safety Where Assessed-Grooming: Standing at sink Upper Body Bathing: Minimal assistance Where Assessed-Upper Body Bathing: Shower Lower Body Bathing: Supervision/safety Where Assessed-Lower Body Bathing: Standing at sink Upper Body Dressing: Supervision/safety Where Assessed-Upper Body Dressing: Edge of bed Lower Body Dressing: Supervision/safety Where Assessed-Lower Body Dressing: Edge of bed Toileting: Supervision/safety Where Assessed-Toileting: Glass blower/designer: Close supervision Armed forces technical officer Method: Counselling psychologist: Ambulance person Transfer: Metallurgist Method: Optometrist:  Radio broadcast assistant, Energy manager: Curator Method: Heritage manager: Radio broadcast assistant Vision Baseline Vision/History:  (unable to state 2/2 global aphasia) Patient Visual Report: Other (comment) (unable to state 2/2 global aphasia) Vision Assessment?: Vision impaired- to be further tested in functional context (Difficulty fully assessing due to global aphasia) Perception  Perception: Impaired Inattention/Neglect: Does not attend to right side of body Praxis Praxis: Impaired Praxis Impairment Details: Perseveration;Ideomotor;Ideation Praxis-Other Comments: Much improved from eval, continues to perseverate on words/ therapeutic exercises requiring physical assist to cease. Cognition Overall Cognitive Status: Difficult to assess (Difficulty fully assessing due to global aphasia) Orientation Level: Oriented to person (Difficulty fully assessing due to global aphasia) Year: Other (Comment) (unable to state due to global aphasia) Month:  (unable to state due to global aphasia) Day of Week: Other (Comment) (unable to state due to global aphasia) Attention: Sustained Sustained Attention: Appears intact Memory:  (difficulty assessing due to global aphasia) Immediate Memory Recall:  (unable to state due to global aphasia) Memory Recall Sock:  (unable to state due to global aphasia) Memory Recall Blue:  (unable to state due to global aphasia) Memory Recall Bed:  (unable to state due to global aphasia) Problem Solving: Impaired Behaviors: Perseveration;Impulsive Safety/Judgment: Impaired Sensation Sensation Light Touch: Impaired Detail Peripheral sensation comments: RUE> RLE hemi Light Touch Impaired Details: Impaired RUE;Impaired RLE Hot/Cold: Appears Intact Proprioception: Impaired by gross assessment Stereognosis: Impaired by gross assessment Additional Comments: RUE > RLE hemipareisis difficult to fully assess 2/2  global aphasia Coordination Gross Motor Movements are Fluid and Coordinated: No Fine Motor Movements are Fluid and Coordinated: No Coordination and Movement Description: RUE > RLE hemiplegia Finger Nose Finger Test: unable to follow instruction/complete on R Motor  Motor Motor: Hemiplegia Motor - Discharge Observations: R hemipareisis UE>LE Mobility  Bed Mobility Bed Mobility: Supine to Sit;Sit to Supine Supine to Sit: Supervision/Verbal cueing Sit to Supine: Supervision/Verbal cueing Transfers Sit  to Stand: Supervision/Verbal cueing Stand to Sit: Supervision/Verbal cueing  Trunk/Postural Assessment  Cervical Assessment Cervical Assessment: Within Functional Limits Thoracic Assessment Thoracic Assessment: Within Functional Limits Lumbar Assessment Lumbar Assessment: Within Functional Limits Postural Control Postural Control: Deficits on evaluation (furniture walks in room)  Balance Balance Balance Assessed: Yes Static Sitting Balance Static Sitting - Balance Support: No upper extremity supported;Feet supported Static Sitting - Level of Assistance: 6: Modified independent (Device/Increase time) Dynamic Sitting Balance Dynamic Sitting - Balance Support: No upper extremity supported;During functional activity;Feet supported Dynamic Sitting - Level of Assistance: 5: Stand by assistance Static Standing Balance Static Standing - Balance Support: During functional activity;Right upper extremity supported Static Standing - Level of Assistance: 5: Stand by assistance Dynamic Standing Balance Dynamic Standing - Balance Support: Right upper extremity supported;During functional activity Dynamic Standing - Level of Assistance: 5: Stand by assistance Extremity/Trunk Assessment RUE Assessment RUE Assessment: Exceptions to [facility] Active Range of Motion (AROM) Comments: ~90 degrees shoulder flexion RUE Body System: Neuro Brunstrum levels for arm and hand: Arm;Hand Brunstrum level for  arm: Stage II Synergy is developing;Stage III Synergy is performed voluntarily Brunstrum level for hand: Stage I Flaccidity LUE Assessment LUE Assessment: Within Functional Limits   Volanda Napoleon MS, OTR/L  01/14/2021, 9:00 PM

## 2021-01-15 ENCOUNTER — Other Ambulatory Visit (HOSPITAL_COMMUNITY): Payer: Self-pay

## 2021-01-15 NOTE — Progress Notes (Signed)
Speech Language Pathology Discharge Summary  Patient Details  Name: Natasha Vazquez MRN: [id number] Date of Birth: [birth year]  Patient has met 5 of 6 long term goals.  Patient to discharge at overall Mod level.   Reasons goals not met: Expressive language/verbal communication goal not met due to slow progress. Patient max-to-total assist for verbal communication   Clinical Impression/Discharge Summary: Patient has made functional gains and has met 5 of 6 long-term goals this admission due to improved receptive language skills resulting in improved ability to answer yes/no questions, follow one-step directions, object identification, and responding to basic biographical questions and pertaining to personal comfort and preferences. Pt currently requires min-to-mod assist for comprehension. Patient has made slow progress with expressive language skills and continues to be limited by consistent perseveration on certain words. Although patient has still been unsuccessful at eliciting responses to open ended questions during ST sessions, she has improved in her ability to verbally respond "yes" or "no". Her responses however can be inconsistent at times (e.g., she may verbally say yes but shake her head no). She has also increased gesture and oral motor imitation. She communicates most effectively by responding to direct yes/no questions and/or gestures (head nodding, pointing). Patient is currently an overall max-to-total A for expressive language. Patient is currently tolerating and dysphagia 3 diet and thin liquids and requires set-up A for meal prep and intermittent sup to recognize occasional anterior spillage. Patient is appropriate for diet advancement to regular textures however had preference to remain on dysphagia 3 diet at this time. Patient and family education is complete and patient to discharge at overall mod assist level. Patient's care partner is independent to provide the necessary physical and  cognitive/communication assistance at discharge. Patient would benefit from continued SLP services in home health or outpatient setting to maximize language function and functional independence.   Care Partner:  Caregiver Able to Provide Assistance: Yes  Type of Caregiver Assistance: Cognitive;Physical  Recommendation:  24 hour supervision/assistance;Home Health SLP  Rationale for SLP Follow Up: Maximize functional communication;Maximize cognitive function and independence;Reduce caregiver burden   Equipment: N/A   Reasons for discharge: Discharged from hospital;Treatment goals met   Patient/Family Agrees with Progress Made and Goals Achieved: Yes    Shwanda Soltis T Camyra Vaeth 01/15/2021, 12:46 PM

## 2021-01-15 NOTE — Progress Notes (Signed)
PROGRESS NOTE   Subjective/Complaints:  Remains aphasic , apraxic  ROS limited due to aphasia   Objective:   No results found. No results for input(s): WBC, HGB, HCT, PLT in the last 72 hours.  No results for input(s): NA, K, CL, CO2, GLUCOSE, BUN, CREATININE, CALCIUM in the last 72 hours.   Intake/Output Summary (Last 24 hours) at 01/15/2021 0905 Last data filed at 01/14/2021 2300 Gross per 24 hour  Intake 676 ml  Output --  Net 676 ml         Physical Exam: Vital Signs Blood pressure 136/76, pulse 78, temperature 97.6 F (36.4 C), resp. rate 16, height 5\' 8"  (1.727 m), weight 68.6 kg, SpO2 100 %.    General: No acute distress Mood and affect are appropriate Heart: Regular rate and rhythm no rubs murmurs or extra sounds Lungs: Clear to auscultation, breathing unlabored, no rales or wheezes Abdomen: Positive bowel sounds, soft nontender to palpation, nondistended Extremities: No clubbing, cyanosis, or edema Skin: No evidence of breakdown, no evidence of rash Right facial weakness Mixed speech apraxia/Global aphasia Motor: Limited due to aphasia, RUE shoulder abduction, bideps and triceps 3/5, handgrip and wrist flexion/ex  0/5, 4/5 RLE, 5/5 on left UE and LE  Assessment/Plan: 1. 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 Stable for D/C today F/u PCP in 3-4 weeks F/u PM&R 2 weeks See D/C summary See D/C instructions   Care Tool:  Bathing    Body parts bathed by patient: Right arm, Chest, Abdomen, Front perineal area, Right upper leg, Left upper leg, Right lower leg, Left lower leg, Face, Buttocks   Body parts bathed by helper: Left arm     Bathing assist Assist Level: Minimal Assistance - Patient > 75%     Upper Body Dressing/Undressing Upper body dressing   What is the patient wearing?: Pull over shirt    Upper body assist Assist Level: Supervision/Verbal cueing    Lower Body  Dressing/Undressing Lower body dressing      What is the patient wearing?: Pants, Underwear/pull up     Lower body assist Assist for lower body dressing: Supervision/Verbal cueing     Toileting Toileting    Toileting assist Assist for toileting: Supervision/Verbal cueing     Transfers Chair/bed transfer  Transfers assist     Chair/bed transfer assist level: Supervision/Verbal cueing Chair/bed transfer assistive device: Armrests   Locomotion Ambulation   Ambulation assist      Assist level: Supervision/Verbal cueing Assistive device: No Device Max distance: >1031ft   Walk 10 feet activity   Assist     Assist level: Supervision/Verbal cueing Assistive device: No Device   Walk 50 feet activity   Assist    Assist level: Supervision/Verbal cueing Assistive device: No Device    Walk 150 feet activity   Assist    Assist level: Supervision/Verbal cueing Assistive device: No Device    Walk 10 feet on uneven surface  activity   Assist     Assist level: Supervision/Verbal cueing     Wheelchair     Assist Is the patient using a wheelchair?: No Type of Wheelchair: Manual Wheelchair activity did not occur: N/A  Wheelchair  assist level:  (Pt is ambulatory and will not be using w/c on d/c.) Max wheelchair distance: 50    Wheelchair 50 feet with 2 turns activity    Assist    Wheelchair 50 feet with 2 turns activity did not occur: N/A   Assist Level: Maximal Assistance - Patient 25 - 49%   Wheelchair 150 feet activity     Assist  Wheelchair 150 feet activity did not occur: N/A   Assist Level: Total Assistance - Patient < 25%   Blood pressure 136/76, pulse 78, temperature 97.6 F (36.4 C), resp. rate 16, height 5\' 8"  (1.727 m), weight 68.6 kg, SpO2 100 %.  Medical Problem List and Plan: 1.  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  Antithrombotics: -DVT/anticoagulation:  Mechanical: Antiembolism stockings, thigh (TED hose) Bilateral lower extremities             -antiplatelet therapy: Aspirin 325 mg daily and Plavix 75 mg daily x3 months then aspirin alone 3. Pain Management: Tylenol as needed 4. Mood: Provide emotional support             -antipsychotic agents: N/A 5. Neuropsych: This patient is not capable of making decisions on her own behalf. 6. Skin/Wound Care: Routine skin checks 7. Fluids/Electrolytes/Nutrition: Routine in and outs 8.  Hypertension.  Continue Norvasc 2.5 mg daily.Her Cozaar 100 mg daily was held due to blood pressure being soft and resume as needed.  Monitor with increased mobility Vitals:   01/14/21 1932 01/15/21 0539  BP: 140/70 136/76  Pulse: 79 78  Resp: 16 16  Temp: 97.9 F (36.6 C) 97.6 F (36.4 C)  SpO2: 98% 100%   12/13- controlled  9.  Hyperlipidemia.  Continue Crestor 10.  History of remote GI bleed 20 years ago.  Continue Protonix. 11.  Slow transit constipation  Bowel meds increased on 12/4- cont BM this am   12/10- Lbm 12/6- will give Sorbitol this Afternoon since has been 4 days. Strongly encourage pt to take since has been 4 days.    LOS: 13 days A FACE TO FACE EVALUATION WAS PERFORMED  Charlett Blake 01/15/2021, 9:05 AM

## 2021-01-15 NOTE — Plan of Care (Signed)
Problem: RH Expression Communication Goal: LTG Patient will verbally express basic/complex needs(SLP) Description: LTG:  Patient will verbally express basic/complex needs, wants or ideas with cues  (SLP) Outcome: Not Met (add Reason) Note: Goal not met due to slow progress   Problem: RH Swallowing Goal: LTG Patient will consume least restrictive diet using compensatory strategies with assistance (SLP) Description: LTG:  Patient will consume least restrictive diet using compensatory strategies with assistance (SLP) Outcome: Completed/Met Goal: LTG Patient will participate in dysphagia therapy to increase swallow function with assistance (SLP) Description: LTG:  Patient will participate in dysphagia therapy to increase swallow function with assistance (SLP) Outcome: Completed/Met Goal: LTG Pt will demonstrate functional change in swallow as evidenced by bedside/clinical objective assessment (SLP) Description: LTG: Patient will demonstrate functional change in swallow as evidenced by bedside/clinical objective assessment (SLP) Outcome: Completed/Met   Problem: RH Comprehension Communication Goal: LTG Patient will comprehend basic/complex auditory (SLP) Description: LTG: Patient will comprehend basic/complex auditory information with cues (SLP). Outcome: Completed/Met   Problem: RH Expression Communication Goal: LTG Patient will express needs/wants via multi-modal(SLP) Description: LTG:  Patient will express needs/wants via multi-modal communication (gestures/written, etc) with cues (SLP) Outcome: Completed/Met

## 2021-01-15 NOTE — Progress Notes (Signed)
INPATIENT REHABILITATION DISCHARGE NOTE   Discharge instructions by: Linna Hoff, PA  Verbalized understanding: yes  Skin care/Wound care healing? none  Pain: none  IV's: none   Tubes/Drains: none   O2: none  Safety instructions: reviewed with pt and family  Patient belongings: sent with pt  Discharged to: home  Discharged via: family transport  Notes: done  Gerald Stabs, Therapist, sports

## 2021-01-23 ENCOUNTER — Ambulatory Visit (HOSPITAL_COMMUNITY): Payer: Medicare Other | Attending: Physician Assistant | Admitting: Physical Therapy

## 2021-01-23 ENCOUNTER — Encounter (HOSPITAL_COMMUNITY): Payer: Self-pay | Admitting: Physical Therapy

## 2021-01-23 ENCOUNTER — Other Ambulatory Visit: Payer: Self-pay

## 2021-01-23 DIAGNOSIS — I63512 Cerebral infarction due to unspecified occlusion or stenosis of left middle cerebral artery: Secondary | ICD-10-CM | POA: Insufficient documentation

## 2021-01-23 DIAGNOSIS — R2689 Other abnormalities of gait and mobility: Secondary | ICD-10-CM | POA: Insufficient documentation

## 2021-01-23 DIAGNOSIS — R4701 Aphasia: Secondary | ICD-10-CM | POA: Diagnosis not present

## 2021-01-23 DIAGNOSIS — R41841 Cognitive communication deficit: Secondary | ICD-10-CM | POA: Insufficient documentation

## 2021-01-23 DIAGNOSIS — R278 Other lack of coordination: Secondary | ICD-10-CM | POA: Diagnosis not present

## 2021-01-23 DIAGNOSIS — R29818 Other symptoms and signs involving the nervous system: Secondary | ICD-10-CM | POA: Insufficient documentation

## 2021-01-23 NOTE — Therapy (Signed)
[facility] [address] [place], [place], [address] Phone: [phone]   Fax:  [phone]  Physical Therapy Evaluation  Patient Details  Name: Natasha Vazquez MRN: [id number] Date of Birth: May 01, 1950 Referring Provider (PT): Lauraine Rinne PA-C   Encounter Date: 01/23/2021   PT End of Session - 01/23/21 1344     Visit Number 1    Number of Visits 1    Date for PT Re-Evaluation 01/23/21    Authorization Type U[employer] Medicare (no auth, no vl ) secondary medicaid    PT Start Time 1345    PT Stop Time 1420    PT Time Calculation (min) 35 min    Equipment Utilized During Treatment Gait belt    Activity Tolerance Patient tolerated treatment well    Behavior During Therapy [facility] for tasks assessed/performed;Impulsive             Past Medical History:  Diagnosis Date   Hypertension    TMJ (dislocation of temporomandibular joint)     Past Surgical History:  Procedure Laterality Date   ABDOMINAL HYSTERECTOMY     CHOLECYSTECTOMY     IR ANGIO INTRA EXTRACRAN SEL INTERNAL CAROTID BILAT MOD SED  09/16/2016   IR ANGIO VERTEBRAL SEL VERTEBRAL BILAT MOD SED  09/16/2016   MASS EXCISION Left 02/10/2020   Procedure: excision of soft tissue mass left upper back;  Surgeon: Jesusita Oka, MD;  Location: [facility];  Service: General;  Laterality: Left;    There were no vitals filed for this visit.    Subjective Assessment - 01/23/21 1350     Subjective Patient is a 70 y.o. female who presents to physical therapy s/p L ischemic MCA CVA on 12/25/20. Patient then went to inpatient rehab at [facility] and was discharged on 01/15/21. She has expressive difficulties since the stroke. She is not having much issues with her legs. No falls. She has been doing steps one at a time.    Limitations Lifting;Walking;House hold activities    Patient Stated Goals move better    Currently in Pain? No/denies                [facility] PT  Assessment - 01/23/21 0001       Assessment   Medical Diagnosis L Ichemic MCA CVA    Referring Provider (PT) Lauraine Rinne PA-C    Onset Date/Surgical Date 12/25/20    Hand Dominance Right    Next MD Visit 01/29/21    Prior Therapy Acute/ CIR      Precautions   Precautions None      Restrictions   Weight Bearing Restrictions No      Balance Screen   Has the patient fallen in the past 6 months No    Has the patient had a decrease in activity level because of a fear of falling?  No    Is the patient reluctant to leave their home because of a fear of falling?  No      Prior Function   Level of Independence Independent    Vocation Retired      Associate Professor   Overall Cognitive Status Within Functional Limits for tasks assessed    Behaviors Impulsive;Restless      Observation/Other Assessments   Observations Ambulates without AD, intermittent unsteadiness    Focus on Therapeutic Outcomes (FOTO)  n/a      Sensation   Light Touch Appears Intact   difficult to fully assess/follow instructions  Coordination   Gross Motor Movements are Fluid and Coordinated No    Fine Motor Movements are Fluid and Coordinated No    Coordination and Movement Description RUE > RLE hemiplegia      ROM / Strength   AROM / PROM / Strength AROM;Strength      AROM   Overall AROM  Within functional limits for tasks performed      Strength   Strength Assessment Site Hip;Knee;Ankle    Right/Left Hip Right;Left    Right Hip Flexion 5/5    Left Hip Flexion 5/5    Right/Left Knee Right;Left    Right Knee Flexion 5/5    Right Knee Extension 5/5    Left Knee Flexion 5/5    Left Knee Extension 5/5    Right/Left Ankle Right;Left    Right Ankle Dorsiflexion 5/5    Left Ankle Dorsiflexion 5/5      Ambulation/Gait   Ambulation/Gait Yes    Ambulation/Gait Assistance 4: Min guard;5: Supervision    Ambulation Distance (Feet) 450 Feet    Assistive device None    Gait Pattern Poor foot clearance -  left;Poor foot clearance - right    Ambulation Surface Level;Indoor    Stairs Yes    Stairs Assistance 4: Min guard;5: Supervision    Stair Management Technique Alternating pattern    Gait Comments 2MWT      Standardized Balance Assessment   Standardized Balance Assessment Dynamic Gait Index      Dynamic Gait Index   Level Surface Normal    Change in Gait Speed Mild Impairment    Gait with Horizontal Head Turns Moderate Impairment    Gait with Vertical Head Turns Mild Impairment    Gait and Pivot Turn Mild Impairment    Step Over Obstacle Moderate Impairment    Step Around Obstacles Mild Impairment    Steps Mild Impairment    Total Score 15    DGI comment: difficulty following instructions/cueing limiting performance                        Objective measurements completed on examination: See above findings.                PT Education - 01/23/21 1343     Education Details Patient educated on exam findings, POC, scope of PT.    Person(s) Educated Patient;Child(ren)   granddaughter   Methods Explanation;Demonstration;Tactile cues;Verbal cues    Comprehension Returned demonstration;Verbal cues required;Tactile cues required              PT Short Term Goals - 01/23/21 1431       PT SHORT TERM GOAL #1   Title Patient and family will be educated on exam findings.    Time 1    Period Days    Status Achieved    Target Date 01/23/21                       Plan - 01/23/21 1425     Clinical Impression Statement Patient is a 70 y.o. female who presents to physical therapy s/p L ischemic MCA CVA on 12/25/20. Patient with expressive difficulties and possible receptive difficulties as well as patient has frequent difficulty with following instructions and she tends to do best with demonstration/mirroring of tasks. Patient overall showing good LE strength with testing and functionally. Patient with slightly impaired dynamic balance with  ambulating/standing and during performance of DGI. Patient most limited  by difficulty following instructions with DGI despite verbal and tactile cueing. Patient will likely benefit from OT and SLP evaluations, they are scheduled for tomorrow (01/24/2021). Patient does not require additional therapy services at this time.    Personal Factors and Comorbidities Age;Comorbidity 2    Comorbidities CVA, communication difficulties    Examination-Activity Limitations Transfers;Stand;Stairs;Squat;Locomotion Level    Examination-Participation Restrictions Community Activity;Laundry;Volunteer;Yard Work;Shop;Cleaning    Stability/Clinical Decision Making Evolving/Moderate complexity    Clinical Decision Making Moderate    Rehab Potential Good    PT Frequency One time visit    PT Treatment/Interventions ADLs/Self Care Home Management;DME Instruction;Stair training;Gait training;Functional mobility training;Therapeutic activities;Therapeutic exercise;Balance training;Neuromuscular re-education;Patient/family education;Orthotic Fit/Training;Manual techniques    PT Next Visit Plan n/a    Recommended Other Services OT, SLP    Consulted and Agree with Plan of Care Patient;Family member/caregiver    Family Member Consulted granddaugher             Patient will benefit from skilled therapeutic intervention in order to improve the following deficits and impairments:  Abnormal gait, Decreased activity tolerance, Decreased balance, Decreased coordination, Decreased safety awareness, Improper body mechanics, Difficulty walking, Decreased mobility  Visit Diagnosis: Other abnormalities of gait and mobility  Acute ischemic left MCA stroke (HCC)  Other symptoms and signs involving the nervous system     Problem List Patient Active Problem List   Diagnosis Date Noted   Slow transit constipation    Dyslipidemia    Left middle cerebral artery stroke ([facility] [place]) 01/02/2021   Chronic heart failure with preserved  ejection fraction (HFpEF) ([place]) 01/02/2021   Acute ischemic stroke ([facility]) 12/26/2020   Essential hypertension 12/26/2020   Mixed hyperlipidemia 12/26/2020   Acute ischemic left MCA stroke ([place]) 12/26/2020    2:33 PM, 01/23/21 Mearl Latin PT, DPT Physical Therapist at [facility] [address] [place], [place], [address] Phone: [phone]   Fax:  [phone]  Name: Natasha Vazquez MRN: [id number] Date of Birth: 1950-03-15

## 2021-01-24 ENCOUNTER — Ambulatory Visit (HOSPITAL_COMMUNITY): Payer: Medicare Other | Admitting: Occupational Therapy

## 2021-01-24 ENCOUNTER — Encounter (HOSPITAL_COMMUNITY): Payer: Self-pay | Admitting: Speech Pathology

## 2021-01-24 ENCOUNTER — Encounter (HOSPITAL_COMMUNITY): Payer: Self-pay | Admitting: Occupational Therapy

## 2021-01-24 ENCOUNTER — Ambulatory Visit (HOSPITAL_COMMUNITY): Payer: Medicare Other | Admitting: Speech Pathology

## 2021-01-24 DIAGNOSIS — R4701 Aphasia: Secondary | ICD-10-CM | POA: Diagnosis not present

## 2021-01-24 DIAGNOSIS — R29818 Other symptoms and signs involving the nervous system: Secondary | ICD-10-CM

## 2021-01-24 DIAGNOSIS — R278 Other lack of coordination: Secondary | ICD-10-CM

## 2021-01-24 DIAGNOSIS — I63512 Cerebral infarction due to unspecified occlusion or stenosis of left middle cerebral artery: Secondary | ICD-10-CM | POA: Diagnosis not present

## 2021-01-24 DIAGNOSIS — R41841 Cognitive communication deficit: Secondary | ICD-10-CM

## 2021-01-24 DIAGNOSIS — R2689 Other abnormalities of gait and mobility: Secondary | ICD-10-CM | POA: Diagnosis not present

## 2021-01-24 NOTE — Patient Instructions (Signed)
Weight-bearing Exercises   1) Weight-bearing lean stretch: From a seated position on your bed or bench, prop yourself up on your affected arm by placing your affected arm about a foot away from your body. Then lean into it.  Hold for 10 seconds.    OR      2) Weight-bearing in standing:  Gently lean your body weight into your hand or fist. Keep the elbow as straight as possible. Hold for 10 seconds.

## 2021-01-24 NOTE — Therapy (Signed)
[place], [place], [address] Phone: [phone]   Fax:  [phone]  Speech Language Pathology Evaluation  Patient Details  Name: Natasha Vazquez MRN: [id number] Date of Birth: 04-14-1950 Referring Provider (SLP): Cathlyn Parsons, PA-C   Encounter Date: 01/24/2021   End of Session - 01/24/21 1647     Visit Number 1    Number of Visits 16    Date for SLP Re-Evaluation 03/14/21    Authorization Type UHC Medicare    SLP Start Time 0945    SLP Stop Time  1030    SLP Time Calculation (min) 45 min    Activity Tolerance Patient tolerated treatment well             Past Medical History:  Diagnosis Date   Hypertension    TMJ (dislocation of temporomandibular joint)     Past Surgical History:  Procedure Laterality Date   ABDOMINAL HYSTERECTOMY     CHOLECYSTECTOMY     IR ANGIO INTRA EXTRACRAN SEL INTERNAL CAROTID BILAT MOD SED  09/16/2016   IR ANGIO VERTEBRAL SEL VERTEBRAL BILAT MOD SED  09/16/2016   MASS EXCISION Left 02/10/2020   Procedure: excision of soft tissue mass left upper back;  Surgeon: Jesusita Oka, MD;  Location: [facility];  Service: General;  Laterality: Left;    There were no vitals filed for this visit.   Subjective Assessment - 01/24/21 1625     Subjective "One, two"    Patient is accompained by: Family member   daughter, Natasha Vazquez   Special Tests [place] Aphasia Screening Tool (MAST)    Currently in Pain? 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 She was discharged from [facility] inpatient rehab on 01/15/21 and her daughter, Natasha Vazquez moved in to help care for her.    Behavioral/Cognition alert and cooperative    Mobility Status ambulatory      Balance Screen   Has the patient fallen in the past 6 months No    Has the patient had a decrease in activity level because of a fear of falling?  No    Is the patient reluctant to leave their home because of a fear of falling?  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[employer], foster parent, cleaning houses     Cognition   Overall Cognitive Status Impaired/Different from baseline    Area of Impairment Following commands;Awareness    Following Commands Follows one step commands inconsistently    Awareness Emergent  Awareness Comments limited awareness of perseveration and expressive language deficits      Auditory Comprehension   Overall Auditory Comprehension Impaired    Yes/No Questions Impaired    Basic Biographical Questions 26-50% accurate    Basic Immediate Environment Questions 50-74% accurate    Commands Impaired    One Step Basic Commands 25-49% accurate    Conversation Simple    Interfering Components Motor planning;Visual impairments    EffectiveTechniques Visual/Gestural cues;Extra processing time;Repetition      Visual Recognition/Discrimination   Discrimination Within Function Limits      Reading Comprehension   Reading Status Impaired    Word level 0-25% accurate    Sentence Level Not tested    Paragraph Level Not tested      Expression   Primary Mode of Expression Verbal      Verbal Expression   Overall Verbal Expression Impaired    Initiation  Impaired    Automatic Speech Counting    Level of Generative/Spontaneous Verbalization Word    Repetition Impaired    Level of Impairment Word level    Naming Impairment    Responsive 0-25% accurate    Confrontation 0-24% accurate    Common Objects Unable to indentify    Divergent 0-24% accurate    Verbal Errors Perseveration    Pragmatics No impairment    Effective Techniques Melodic intonation    Non-Verbal Means of Communication Gestures      Written Expression   Dominant Hand Right    Written Expression Exceptions to [facility]      Oral Motor/Sensory Function   Overall Oral Motor/Sensory Function Impaired    Labial ROM Reduced right      Motor Speech   Overall Motor Speech Impaired    Respiration Within functional limits    Phonation Normal    Resonance Within functional limits    Articulation Impaired    Level of Impairment Word    Intelligibility Intelligibility reduced    Word Other (comment)    Motor Planning Impaired    Level of Impairment Word    Motor Speech Errors Unaware    Phonation WFL      Standardized Assessments   Standardized Assessments  Other Assessment   MASA   Other Assessment 28/100            MAST Pt was administered the [place] Aphasia Screening Tool (MAST) and achieved an overall expressive score of 2/50, receptive score of 26/50, and a total score of 28/100.  Expressive Index Naming 0/10 Automatic Speech 0/10 Repetition 0/10 Writing 0/10 Verbal Fluency 2/10 Expressive Subscale 2/50  Receptive Index Yes/No Accuracy 20/20 Object Recognition 4/10 Following Instructions 2/10 Reading Instructions 0/10 Receptive Subscale 26/50  Total Index Expressive 2/50 Receptive 26/50 Total Score 28/100     01/24/21 1509  SLP SHORT TERM GOAL #1  Title Patient will point to objects and object pictures/photos when named in field of 4 with 80% accuracy and cue for error awareness prn.  Baseline F=2, 90%, F=3, 80%, F=4, 70%  Time 4  Period  Weeks  Status New  Target Date 02/28/21  SLP SHORT TERM GOAL #2  Title Pt will increase naming of common objects/pictures to 80% acc when provided with mod/max cues.  Baseline 0% independently, 66% with max cues  Time 4  Period Weeks  Status New  Target Date 02/28/21  SLP SHORT TERM GOAL #3  Title Pt will complete single word sentence completion tasks with set list of high frequency words  with 80% acc when provided picture cue and mod prompts from SLP.  Baseline 70% max assist  Time 4  Period Weeks  Status New  Target Date 02/28/21  SLP SHORT TERM GOAL #4  Title Pt will provide a gesture for set list of 20 words with 90% effectiveness and mi/mod prompt via modeling from SLP.  Baseline 75% max assist  Time 4  Period Weeks  Status New  Target Date 02/28/21  SLP SHORT TERM GOAL #5  Title Pt will verbalize automatic and melodic tasks during faded choral singing/talking activities with allowance for approximations and 80% acc with mod assist from SLP.  Baseline 50% max assist  Time 4  Period Weeks  Status New  Target Date 02/28/21      01/24/21 1530  SLP LONG TERM GOAL #1  Title Pt will communicate basic wants/needs to [facility] via multimodality communication strategies with mi/mod assist from caregivers.  Baseline Total assist  Time 3  Period Months  Status New  Target Date 04/25/21  SLP LONG TERM GOAL #2  Title Pt will increase auditory comprehension for basic level information to [facility] with use of multimodality cues and min assist from caregivers.  Baseline mod assist  Time 3  Period Months  Status New  Target Date 04/25/21    Plan - 01/24/21 1649     Clinical Impression Statement Pt presents with severe expressive aphasia and mod/severe receptive aphasia characterized by perseverative and stereotypical utterances ("talking, one, two"), anomia, impaired repetition and verbal fluency, reduced auditory object/word recognition, and impaired reading comprehension at the word  level. Pt with relative strengths in responding to yes/no questions, completing automatic speech tasks, and communicating with gestures. She has excellent family support and is motivated to improve. Pt will benefit from skilled SLP in order to address the above impairments, maximize independence, and decrease burden of care     Speech Therapy Frequency 2x / week    Duration 8 weeks    Treatment/Interventions SLP instruction and feedback;Compensatory strategies;Patient/family education;Cueing hierarchy;Multimodal communcation approach;Language facilitation;Compensatory techniques    Potential to Achieve Goals Good    Potential Considerations Severity of impairments    SLP Home Exercise Plan Pt will completed HEP as assigned to facilitate carryover of treatment strategies and techniques in home environment with use of written cues as needed.    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 [place], [place], [address] Phone: [phone]   Fax:  [phone]  Name: TANAIRI CYPERT MRN: [id number] Date of Birth: 1950-09-06

## 2021-01-24 NOTE — Therapy (Signed)
[facility], [place], [address] Phone: [phone]   Fax:  [phone]  Occupational Therapy Evaluation  Patient Details  Name: Natasha Vazquez MRN: [id number] Date of Birth: [birth year] Referring Provider (OT): Lauraine Rinne, PA-C   Encounter Date: 01/24/2021   OT End of Session - 01/24/21 1247     Visit Number 1    Number of Visits 24    Date for OT Re-Evaluation 03/25/21   mini-reassessment 02/21/2021   Authorization Type 1) UHC Medicare 2) Medicaid    Authorization Time Period no visit limit    Progress Note Due on Visit 10    OT Start Time [phone]    OT Stop Time 0940    OT Time Calculation (min) 48 min    Activity Tolerance Patient tolerated treatment well    Behavior During Therapy [facility] for tasks assessed/performed             Past Medical History:  Diagnosis Date   Hypertension    TMJ (dislocation of temporomandibular joint)     Past Surgical History:  Procedure Laterality Date   ABDOMINAL HYSTERECTOMY     CHOLECYSTECTOMY     IR ANGIO INTRA EXTRACRAN SEL INTERNAL CAROTID BILAT MOD SED  09/16/2016   IR ANGIO VERTEBRAL SEL VERTEBRAL BILAT MOD SED  09/16/2016   MASS EXCISION Left 02/10/2020   Procedure: excision of soft tissue mass left upper back;  Surgeon: Jesusita Oka, MD;  Location: [facility];  Service: General;  Laterality: Left;    There were no vitals filed for this visit.   Subjective Assessment - 01/24/21 1245     Subjective  S: daughter reports pt is completing ADLs with supervision    Patient is accompanied by: Family member   daughter   Patient Stated Goals To improve right arm functioning    Currently in Pain? No/denies               [facility] OT Assessment - 01/24/21 0850       Assessment   Medical Diagnosis s/p L Ichemic MCA CVA    Referring Provider (OT) Lauraine Rinne, PA-C    Onset Date/Surgical Date 12/25/20    Hand Dominance Right    Next MD Visit  01/29/21    Prior Therapy Acute/ CIR      Precautions   Precautions None      Restrictions   Weight Bearing Restrictions No      Balance Screen   Has the patient fallen in the past 6 months No    Has the patient had a decrease in activity level because of a fear of falling?  No    Is the patient reluctant to leave their home because of a fear of falling?  No      Prior Function   Level of Independence Independent    Vocation Retired    Engineer, drilling 2 ladies houses 2x/week, [employer] every week, likes to clean and tidy things      ADL   ADL comments Pt is completing ADLs at supervision level, tries to use RUE as assist when able. Pt is unable to use hand for any ADLs due to lack of movement.      Written Expression   Dominant Hand Right      Vision - History   Baseline Vision Wears glasses all the time    Visual History Macular degeneration      Coordination  Gross Motor Movements are Fluid and Coordinated No    Fine Motor Movements are Fluid and Coordinated No      ROM / Strength   AROM / PROM / Strength Strength;AROM      AROM   Overall AROM Comments Pt able to complete elbow and forearm A/ROM    AROM Assessment Site Shoulder    Right/Left Shoulder Right    Right Shoulder Flexion 109 Degrees    Right Shoulder ABduction 90 Degrees    Right Shoulder Internal Rotation 90 Degrees    Right Shoulder External Rotation 0 Degrees      Strength   Strength Assessment Site Shoulder;Elbow;Forearm;Wrist    Right/Left Shoulder Right    Right Shoulder Flexion 3-/5    Right Shoulder ABduction 3-/5    Right Shoulder Internal Rotation 3-/5    Right Shoulder External Rotation 3+/5    Right/Left Elbow Right    Right Elbow Flexion 3/5    Right Elbow Extension 3/5    Right/Left Forearm Right    Right Forearm Pronation 3-/5    Right Forearm Supination 3-/5    Right/Left Wrist Right    Right Wrist Flexion 0/5    Right Wrist Extension 0/5    Right Wrist Radial Deviation 0/5     Right Wrist Ulnar Deviation 0/5                              OT Education - 01/24/21 1247     Education Details RUE weightbearing    Person(s) Educated Patient;Child(ren)    Methods Explanation;Demonstration;Handout    Comprehension Verbalized understanding;Returned demonstration              OT Short Term Goals - 01/24/21 1259       OT SHORT TERM GOAL #1   Title Pt will be provided with and educated on HEP to improve mobility of RUE as required for ADL completion.    Time 4    Period Weeks    Status New    Target Date 02/23/21      OT SHORT TERM GOAL #2   Title Pt will increase RUE strength to 4-/5 to improve ability to use RUE as assist when carrying items during ADLs.    Time 4    Period Weeks    Status New      OT SHORT TERM GOAL #3   Title Pt will demonstrate activation of wrist and digits required for a gross grasp on objects.    Time 4    Period Weeks    Status New      OT SHORT TERM GOAL #4   Title Pt will be educated on and demonstrate independence in NMR strategies to improve functional use of RUE.    Time 4    Period Weeks    Status New               OT Long Term Goals - 01/24/21 1317       OT LONG TERM GOAL #1   Title Pt will increase RUE strength to 4/5 or greater to improve ability to use RUE as active assist during ADLs.    Time 8    Period Weeks    Status New    Target Date 03/25/21      OT LONG TERM GOAL #2   Title Pt will demonstrate 25% or greater increase in A/ROM in RUE to improve ability to  perform functional reaching tasks.    Time 8    Period Weeks    Status New      OT LONG TERM GOAL #3   Title Pt will demonstrate fine motor activation and control by completing 9 hole peg test or box and blocks test for coordination.    Time 8    Period Weeks    Status New                   Plan - 01/24/21 1249     Clinical Impression Statement A: Pt is a 70 y/o female s/p left MCA CVA on 12/25/20  presenting with right hemiplegia and decreased functional use of dominant RUE for ADLs. Pt demonstrates functional shoulder/elbow/forearm mobility, wrist and hand are flaccid at time of evaluation. Daughter reports swelling at night, size small edema glove provided with pt and daughter assisting in donning.    OT Occupational Profile and History Detailed Assessment- Review of Records and additional review of physical, cognitive, psychosocial history related to current functional performance    Occupational performance deficits (Please refer to evaluation for details): ADL's;IADL's;Leisure    Body Structure / Function / Physical Skills ADL;Endurance;UE functional use;ROM;Proprioception;IADL;Strength;Edema;Tone;Sensation;Coordination;FMC    Rehab Potential Good    Clinical Decision Making Limited treatment options, no task modification necessary    Comorbidities Affecting Occupational Performance: None    Modification or Assistance to Complete Evaluation  No modification of tasks or assist necessary to complete eval    OT Frequency 3x / week    OT Duration 8 weeks    OT Treatment/Interventions Self-care/ADL training;Ultrasound;DME and/or AE instruction;Patient/family education;Passive range of motion;Cryotherapy;Electrical Stimulation;Moist Heat;Therapeutic exercise;Manual Therapy;Therapeutic activities;Splinting    Plan P: Pt will benefit from skilled OT services to improve functional use of dominant RUE required for functional tasks. Treatment plan: P/ROM, A/ROM, general RUE strengthening, weightbearing & NMES, tone management prn, splinting prn. Pt scheduled for 3x/week for 4 weeks then plan for 2x/week for 4 weeks if pt is progressing well and 2x/week is appropriate.    OT Home Exercise Plan eval: weightbearing    Consulted and Agree with Plan of Care Patient             Patient will benefit from skilled therapeutic intervention in order to improve the following deficits and impairments:    Body Structure / Function / Physical Skills: ADL, Endurance, UE functional use, ROM, Proprioception, IADL, Strength, Edema, Tone, Sensation, Coordination, [facility]       Visit Diagnosis: Other symptoms and signs involving the nervous system  Other lack of coordination    Problem List Patient Active Problem List   Diagnosis Date Noted   Slow transit constipation    Dyslipidemia    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

## 2021-01-29 ENCOUNTER — Other Ambulatory Visit: Payer: Self-pay

## 2021-01-29 ENCOUNTER — Encounter (HOSPITAL_COMMUNITY): Payer: Medicare Other | Admitting: Speech Pathology

## 2021-01-29 ENCOUNTER — Encounter: Payer: Medicare Other | Attending: Registered Nurse | Admitting: Registered Nurse

## 2021-01-29 ENCOUNTER — Encounter: Payer: Self-pay | Admitting: Registered Nurse

## 2021-01-29 VITALS — BP 155/80 | HR 90 | Temp 98.0°F | Ht 68.0 in | Wt 155.0 lb

## 2021-01-29 DIAGNOSIS — E785 Hyperlipidemia, unspecified: Secondary | ICD-10-CM | POA: Diagnosis not present

## 2021-01-29 DIAGNOSIS — I63512 Cerebral infarction due to unspecified occlusion or stenosis of left middle cerebral artery: Secondary | ICD-10-CM | POA: Insufficient documentation

## 2021-01-29 DIAGNOSIS — I10 Essential (primary) hypertension: Secondary | ICD-10-CM | POA: Diagnosis not present

## 2021-01-29 DIAGNOSIS — R4701 Aphasia: Secondary | ICD-10-CM | POA: Insufficient documentation

## 2021-01-29 NOTE — Progress Notes (Signed)
Subjective:    Patient ID: Natasha Vazquez, female    DOB: 12-26-1950, 70 y.o.   MRN: [id number]  HPI: Natasha Vazquez is a 70 y.o. female whose here for HFU appointment of her Left Middle Cerebral Artery Stroke, Expressive Aphasia, Essential Hypertension and Dyslipidemia. She presented to [facility]  on 12/25/2020 via EMS , with right side weakness, aphasia and altered mental status.   Dr. Josephine Cables H&P: on 12/25/2020 Chief Complaint: Altered mental status   HPI: Natasha Vazquez is a 70 y.o. female with medical history significant for hypertension, hyperlipidemia who presents to the emergency department via EMS due to altered mental status.  Patient was unable to provide history, history was obtained from ED physician and ED medical record as well as from son and daughter at bedside.  Per report, patient was last seen normal on 11/21 around 2 PM, son states that patient was seen on the floor, it was unknown how long she has been on the floor.  She was not responding, EMS was activated and patient was taken to the ED for further evaluation and management.  Neurology was consulted.  CT Angio:   IMPRESSION: 1. Hypodensity in the posterior left frontal lobe, which is concerning for an acute or subacute infarct. 2. No intracranial large vessel occlusion. Multifocal calcifications in the left MCA, likely with multifocal stenosis. 3. Multifocal narrowing of the right A1 and bilateral distal MCA branches. 4. No hemodynamically significant stenosis in the neck. 5. Subcentimeter incidental thyroid nodule. No follow-up imaging is recommended. Reference: J Am Coll Radiol. 2015 Feb;12(2): 143-50 6. Aortic Atherosclerosis (ICD10-I70.0).  MR Brain: WO Contrast: IMPRESSION: 1. Extensive acute infarcts throughout the left cerebrum, including left frontal, parietal, and temporal lobes and left basal ganglia. Mild petechial hemorrhage without mass occupying hemorrhagic transformation. Edema without  significant mass effect. 2. Moderate chronic microvascular ischemic disease.  She was maintained on aspirin and Plavix for CVA prophylaxis for 3 months then aspirin alone. Natasha Vazquez was admitted to inpatient rehabilitation on 01/02/2021 and discharged home on 01/15/2021. She is receiving outpatient therapy at [facility] in [place]. She denies anypain. She rates her pain 0. Also reports she has good appetite.     Pain Inventory Average Pain 0 Pain Right Now 0 My pain is  no pain  LOCATION OF PAIN  no pain  BOWEL Number of stools per week: 3-4  Oral laxative use Yes  Type of laxative Colace Enema or suppository use Yes  History of colostomy Yes  Incontinent Yes   BLADDER Normal   Mobility walk without assistance how many minutes can you walk? 20 minutes or more ability to climb steps?  yes do you drive?  no Do you have any goals in this area?  yes  Function retired I need assistance with the following:  bathing, meal prep, household duties, and shopping Do you have any goals in this area?  yes  Neuro/Psych No problems in this area  Prior Studies Any changes since last visit?  no  Physicians involved in your care Any changes since last visit?  no   No family history on file. Social History   Socioeconomic History   Marital status: Legally Separated    Spouse name: Not on file   Number of children: Not on file   Years of education: Not on file   Highest education level: Not on file  Occupational History   Not on file  Tobacco Use   Smoking status: Never  Smokeless tobacco: Never  Vaping Use   Vaping Use: Never used  Substance and Sexual Activity   Alcohol use: No   Drug use: No   Sexual activity: Not on file  Other Topics Concern   Not on file  Social History Narrative   Not on file   Social Determinants of Health   Financial Resource Strain: Not on file  Food Insecurity: Not on file  Transportation Needs: Not on file  Physical Activity:  Not on file  Stress: Not on file  Social Connections: Not on file   Past Surgical History:  Procedure Laterality Date   ABDOMINAL HYSTERECTOMY     CHOLECYSTECTOMY     IR ANGIO INTRA EXTRACRAN SEL INTERNAL CAROTID BILAT MOD SED  09/16/2016   IR ANGIO VERTEBRAL SEL VERTEBRAL BILAT MOD SED  09/16/2016   MASS EXCISION Left 02/10/2020   Procedure: excision of soft tissue mass left upper back;  Surgeon: Jesusita Oka, MD;  Location: [place];  Service: General;  Laterality: Left;   Past Medical History:  Diagnosis Date   Hypertension    TMJ (dislocation of temporomandibular joint)    BP (!) 155/80    Pulse 90    Temp 98 F (36.7 C)    Ht 5\' 8"  (1.727 m)    Wt 155 lb (70.3 kg)    SpO2 95%    BMI 23.57 kg/m   Opioid Risk Score:   Fall Risk Score:  `1  Depression screen PHQ 2/9  Depression screen PHQ 2/9 01/29/2021  Decreased Interest 0  Down, Depressed, Hopeless 0  PHQ - 2 Score 0  Altered sleeping 0  Tired, decreased energy 0  Change in appetite 0  Feeling bad or failure about yourself  0  Trouble concentrating 0  Moving slowly or fidgety/restless 0  Suicidal thoughts 0  PHQ-9 Score 0    Review of Systems  Neurological:  Positive for speech difficulty and weakness.       Lack of control of right arm & hand  All other systems reviewed and are negative.     Objective:   Physical Exam Vitals and nursing note reviewed.  Constitutional:      Appearance: Normal appearance.  Cardiovascular:     Rate and Rhythm: Normal rate and regular rhythm.     Pulses: Normal pulses.     Heart sounds: Normal heart sounds.  Pulmonary:     Effort: Pulmonary effort is normal.     Breath sounds: Normal breath sounds.  Musculoskeletal:     Cervical back: Normal range of motion and neck supple.     Comments: Normal Muscle Bulk and Muscle Testing Reveals:  Upper Extremities:Right : Decreased ROM 90 Degrees and Muscle Strength 0/5 Lower Extremities: Full ROM and Muscle  Strength 5/5     Skin:    General: Skin is warm and dry.  Neurological:     Mental Status: She is alert and oriented to person, place, and time.  Psychiatric:        Mood and Affect: Mood normal.        Behavior: Behavior normal.         Assessment & Plan:  Left Middle Cerebral Artery Stroke: Expressive Aphasia: Continue Outpatient Therapy at [place] Hypertension: Continue current medication regimen: PCP Following  Dyslipidemia.: Continue current medication: PCP Following . Continue to Monitor.  F/U with Dr Letta Pate in 4- 6 weeks

## 2021-01-30 ENCOUNTER — Encounter (HOSPITAL_COMMUNITY): Payer: Self-pay | Admitting: Occupational Therapy

## 2021-01-30 ENCOUNTER — Ambulatory Visit (HOSPITAL_COMMUNITY): Payer: Medicare Other | Admitting: Occupational Therapy

## 2021-01-30 DIAGNOSIS — I63512 Cerebral infarction due to unspecified occlusion or stenosis of left middle cerebral artery: Secondary | ICD-10-CM | POA: Diagnosis not present

## 2021-01-30 DIAGNOSIS — R29818 Other symptoms and signs involving the nervous system: Secondary | ICD-10-CM

## 2021-01-30 DIAGNOSIS — R4701 Aphasia: Secondary | ICD-10-CM | POA: Diagnosis not present

## 2021-01-30 DIAGNOSIS — R2689 Other abnormalities of gait and mobility: Secondary | ICD-10-CM | POA: Diagnosis not present

## 2021-01-30 DIAGNOSIS — R278 Other lack of coordination: Secondary | ICD-10-CM | POA: Diagnosis not present

## 2021-01-30 NOTE — Therapy (Signed)
[place], [place], [address] Phone: [phone]   Fax:  [phone]  Occupational Therapy Treatment  Patient Details  Name: Natasha Vazquez MRN: [id number] Date of Birth: [birth year] Referring Provider (OT): Lauraine Rinne, PA-C   Encounter Date: 01/30/2021   OT End of Session - 01/30/21 1706     Visit Number 2    Number of Visits 24    Date for OT Re-Evaluation 03/25/21   mini-reassessment 02/21/2021   Authorization Type 1) UHC Medicare 2) Medicaid    Authorization Time Period no visit limit    Progress Note Due on Visit 10    OT Start Time 1509    OT Stop Time 1554    OT Time Calculation (min) 45 min    Activity Tolerance Patient tolerated treatment well    Behavior During Therapy [facility] for tasks assessed/performed             Past Medical History:  Diagnosis Date   Hypertension    TMJ (dislocation of temporomandibular joint)     Past Surgical History:  Procedure Laterality Date   ABDOMINAL HYSTERECTOMY     CHOLECYSTECTOMY     IR ANGIO INTRA EXTRACRAN SEL INTERNAL CAROTID BILAT MOD SED  09/16/2016   IR ANGIO VERTEBRAL SEL VERTEBRAL BILAT MOD SED  09/16/2016   MASS EXCISION Left 02/10/2020   Procedure: excision of soft tissue mass left upper back;  Surgeon: Jesusita Oka, MD;  Location: [place];  Service: General;  Laterality: Left;    There were no vitals filed for this visit.   Subjective Assessment - 01/30/21 1506     Subjective  S: family reports compliance with HEP    Patient is accompanied by: Family member   granddaughter-Natasha Vazquez   Currently in Pain? No/denies                [facility] OT Assessment - 01/30/21 1506       Assessment   Medical Diagnosis s/p L Ichemic MCA CVA      Precautions   Precautions None                      OT Treatments/Exercises (OP) - 01/30/21 1533       Neurological Re-education Exercises   Shoulder Protraction AROM;5  reps;Other (comment)   OT providing target to reach for   Elbow Flexion AROM;10 reps    Elbow Extension AROM;10 reps    Forearm Supination AROM;10 reps    Forearm Pronation AROM;10 reps    Weight Bearing Position Quadraped;Standing    Standing with weight shifting on and off Pt flattening yellow theraputty in standing, pushing and then relaxing.    Other Weight-Bearing Exercises 1 Pt in quadruped, completing grooved pegboard with left hand and weightbearing on RUE. Maintained position for 3.5'      Modalities   Modalities Teacher, English as a foreign language Location right wrist extensors    Chartered certified accountant Engineer, petroleum Parameters 35 mA CC    Electrical Stimulation Goals Neuromuscular facilitation                    OT Education - 01/30/21 1545     Education Details elbow and forearm ROM with focus on form versus speed    Person(s) Educated Patient;Caregiver(s)   granddaughter-Natasha Vazquez   Methods Explanation;Demonstration;Handout    Comprehension Verbalized understanding;Returned  demonstration              OT Short Term Goals - 01/30/21 1506       OT SHORT TERM GOAL #1   Title Pt will be provided with and educated on HEP to improve mobility of RUE as required for ADL completion.    Time 4    Period Weeks    Status On-going    Target Date 02/23/21      OT SHORT TERM GOAL #2   Title Pt will increase RUE strength to 4-/5 to improve ability to use RUE as assist when carrying items during ADLs.    Time 4    Period Weeks    Status On-going      OT SHORT TERM GOAL #3   Title Pt will demonstrate activation of wrist and digits required for a gross grasp on objects.    Time 4    Period Weeks    Status On-going      OT SHORT TERM GOAL #4   Title Pt will be educated on and demonstrate independence in NMR strategies to improve functional use of RUE.    Time 4    Period Weeks    Status  On-going               OT Long Term Goals - 01/30/21 1506       OT LONG TERM GOAL #1   Title Pt will increase RUE strength to 4/5 or greater to improve ability to use RUE as active assist during ADLs.    Time 8    Period Weeks    Status On-going    Target Date 03/25/21      OT LONG TERM GOAL #2   Title Pt will demonstrate 25% or greater increase in A/ROM in RUE to improve ability to perform functional reaching tasks.    Time 8    Period Weeks    Status On-going      OT LONG TERM GOAL #3   Title Pt will demonstrate fine motor activation and control by completing 9 hole peg test or box and blocks test for coordination.    Time 8    Period Weeks    Status On-going                   Plan - 01/30/21 1707     Clinical Impression Statement A: Pt and granddaughter report completion of HEP, wearing edema glove to session. Pt completing weightbearing at beginning of session then transitioning to NMES for wrist extension with excellent results for wrist and digits. Pt completing RUE ROM exercises with emphasis on form versus speed today. Towards end of session OT stabilizing pt's upper arm and forearm regions and pt was able to activate wrist flexors in gravity eliminated positions for a few reps of wrist flexion. Provided information for NMES units to granddaughter.    Body Structure / Function / Physical Skills ADL;Endurance;UE functional use;ROM;Proprioception;IADL;Strength;Edema;Tone;Sensation;Coordination;FMC    Plan P: Continue with weightbearing and NMES, trial AA/ROM with coban or theraband wrapping for hand    OT Home Exercise Plan eval: weightbearing    Consulted and Agree with Plan of Care Patient             Patient will benefit from skilled therapeutic intervention in order to improve the following deficits and impairments:   Body Structure / Function / Physical Skills: ADL, Endurance, UE functional use, ROM, Proprioception, IADL, Strength, Edema, Tone,  Sensation, Coordination,  [facility]       Visit Diagnosis: Other symptoms and signs involving the nervous system  Other lack of coordination    Problem List Patient Active Problem List   Diagnosis Date Noted   Slow transit constipation    Dyslipidemia    Left middle cerebral artery stroke ([place]) 01/02/2021   Chronic heart failure with preserved ejection fraction (HFpEF) ([place]) 01/02/2021   Acute ischemic stroke ([place]) 12/26/2020   Essential hypertension 12/26/2020   Mixed hyperlipidemia 12/26/2020   Acute ischemic left MCA stroke [facility]) 12/26/2020    Guadelupe Sabin, OTR/L  [phone] 01/30/2021, 5:17 PM  [place], [place], [address] Phone: [phone]   Fax:  [phone]  Name: GERALDENE EISEL MRN: [id number] Date of Birth: [birth year]

## 2021-01-30 NOTE — Patient Instructions (Signed)
Complete exercises 10-15 times each, 2-3 times per day. Work on form versus speed.  1) Elbow flexion and extension Bend your elbow upwards as shown and then lower to a straighten position.     2) Forearm supination and pronation Hold elbow at a right angle stabilizing on a table or armrest. Keep elbow at side and turn palm up and down.

## 2021-01-31 ENCOUNTER — Ambulatory Visit (HOSPITAL_COMMUNITY): Payer: Medicare Other | Admitting: Occupational Therapy

## 2021-01-31 ENCOUNTER — Ambulatory Visit (HOSPITAL_COMMUNITY): Payer: Medicare Other | Admitting: Speech Pathology

## 2021-01-31 ENCOUNTER — Encounter (HOSPITAL_COMMUNITY): Payer: Self-pay | Admitting: Occupational Therapy

## 2021-01-31 ENCOUNTER — Encounter (HOSPITAL_COMMUNITY): Payer: Self-pay | Admitting: Speech Pathology

## 2021-01-31 ENCOUNTER — Other Ambulatory Visit: Payer: Self-pay

## 2021-01-31 DIAGNOSIS — R4701 Aphasia: Secondary | ICD-10-CM | POA: Diagnosis not present

## 2021-01-31 DIAGNOSIS — R29818 Other symptoms and signs involving the nervous system: Secondary | ICD-10-CM | POA: Diagnosis not present

## 2021-01-31 DIAGNOSIS — I63512 Cerebral infarction due to unspecified occlusion or stenosis of left middle cerebral artery: Secondary | ICD-10-CM | POA: Diagnosis not present

## 2021-01-31 DIAGNOSIS — R278 Other lack of coordination: Secondary | ICD-10-CM | POA: Diagnosis not present

## 2021-01-31 DIAGNOSIS — R41841 Cognitive communication deficit: Secondary | ICD-10-CM

## 2021-01-31 DIAGNOSIS — R2689 Other abnormalities of gait and mobility: Secondary | ICD-10-CM | POA: Diagnosis not present

## 2021-01-31 NOTE — Therapy (Signed)
[place], [place], [address] Phone: [phone]   Fax:  [phone]  Occupational Therapy Treatment  Patient Details  Name: Natasha Vazquez MRN: [id number] Date of Birth: 02-24-1950 Referring Provider (OT): Lauraine Rinne, PA-C   Encounter Date: 01/31/2021   OT End of Session - 01/31/21 0904     Visit Number 3    Number of Visits 24    Date for OT Re-Evaluation 03/25/21   mini-reassessment 02/21/2021   Authorization Type 1) UHC Medicare 2) Medicaid    Authorization Time Period no visit limit    Progress Note Due on Visit 10    OT Start Time [phone]    OT Stop Time 0855    OT Time Calculation (min) 39 min    Activity Tolerance Patient tolerated treatment well    Behavior During Therapy [facility] for tasks assessed/performed             Past Medical History:  Diagnosis Date   Hypertension    TMJ (dislocation of temporomandibular joint)     Past Surgical History:  Procedure Laterality Date   ABDOMINAL HYSTERECTOMY     CHOLECYSTECTOMY     IR ANGIO INTRA EXTRACRAN SEL INTERNAL CAROTID BILAT MOD SED  09/16/2016   IR ANGIO VERTEBRAL SEL VERTEBRAL BILAT MOD SED  09/16/2016   MASS EXCISION Left 02/10/2020   Procedure: excision of soft tissue mass left upper back;  Surgeon: Jesusita Oka, MD;  Location: [place];  Service: General;  Laterality: Left;    There were no vitals filed for this visit.   Subjective Assessment - 01/31/21 0817     Subjective  S: granddaughter reports pt seemed to have some pain or a cramp in her left arm on the way here.    Currently in Pain? No/denies                [facility] OT Assessment - 01/31/21 0817       Assessment   Medical Diagnosis s/p L Ichemic MCA CVA      Precautions   Precautions None                      OT Treatments/Exercises (OP) - 01/31/21 0818       Neurological Re-education Exercises   Shoulder Flexion AAROM;10 reps    right hand secured with ace wrap, 2 sets   Shoulder Protraction AAROM;10 reps   right hand secured with ace wrap, 2 sets   Shoulder Horizontal ABduction AAROM;10 reps   right hand secured with ace wrap, 2 sets   Elbow Flexion AAROM;10 reps   right hand secured with ace wrap, 2 sets   Weight Bearing Position Quadraped;Seated    Seated with weight on hand Pt seated with RUE extended, OT providing min assist at elbow to prevent bucking. Completed two 1' holds    Other Weight-Bearing Exercises 1 Pt in quadruped, completing large pegboard with left hand and weightbearing on RUE, also weightshifting on and off RUE during task. Maintained position for approximately 2'      Modalities   Modalities Teacher, English as a foreign language Location right wrist extensors    Warden/ranger Parameters 30 mA CC    Electrical Stimulation Goals Neuromuscular facilitation  OT Short Term Goals - 01/30/21 1506       OT SHORT TERM GOAL #1   Title Pt will be provided with and educated on HEP to improve mobility of RUE as required for ADL completion.    Time 4    Period Weeks    Status On-going    Target Date 02/23/21      OT SHORT TERM GOAL #2   Title Pt will increase RUE strength to 4-/5 to improve ability to use RUE as assist when carrying items during ADLs.    Time 4    Period Weeks    Status On-going      OT SHORT TERM GOAL #3   Title Pt will demonstrate activation of wrist and digits required for a gross grasp on objects.    Time 4    Period Weeks    Status On-going      OT SHORT TERM GOAL #4   Title Pt will be educated on and demonstrate independence in NMR strategies to improve functional use of RUE.    Time 4    Period Weeks    Status On-going               OT Long Term Goals - 01/30/21 1506       OT LONG TERM GOAL #1   Title Pt will increase RUE strength to  4/5 or greater to improve ability to use RUE as active assist during ADLs.    Time 8    Period Weeks    Status On-going    Target Date 03/25/21      OT LONG TERM GOAL #2   Title Pt will demonstrate 25% or greater increase in A/ROM in RUE to improve ability to perform functional reaching tasks.    Time 8    Period Weeks    Status On-going      OT LONG TERM GOAL #3   Title Pt will demonstrate fine motor activation and control by completing 9 hole peg test or box and blocks test for coordination.    Time 8    Period Weeks    Status On-going                   Plan - 01/31/21 0904     Clinical Impression Statement A: Pt had a great session today, continued with weightbearing in sitting and quadruped to begin session. Added AA/ROM with right hand secured to pvc with ace wrap, visual and verbal cuing for form, cuing to slow down at times. Continued with NMES, able to achieve wrist and digit extension at 30 mA CC today versus 35. After NMES pt demonstrates ability to extend wrist to neutral 1x against gravity, several times flexion/extension in gravity eliminated plane with OT securing forearm to limit compensatory movements.    Body Structure / Function / Physical Skills ADL;Endurance;UE functional use;ROM;Proprioception;IADL;Strength;Edema;Tone;Sensation;Coordination;FMC    Plan P: Continue with weightbearing and NMES, AA/ROM and add to HEP    OT Home Exercise Plan eval: weightbearing    Consulted and Agree with Plan of Care Patient             Patient will benefit from skilled therapeutic intervention in order to improve the following deficits and impairments:   Body Structure / Function / Physical Skills: ADL, Endurance, UE functional use, ROM, Proprioception, IADL, Strength, Edema, Tone, Sensation, Coordination, [place]       Visit Diagnosis: Other symptoms and signs involving the nervous system  Other lack of coordination    Problem List Patient Active Problem List    Diagnosis Date Noted   Slow transit constipation    Dyslipidemia    Left middle cerebral artery stroke ([name]) 01/02/2021   Chronic heart failure with preserved ejection fraction (HFpEF) ([name]) 01/02/2021   Acute ischemic stroke ([name]) 12/26/2020   Essential hypertension 12/26/2020   Mixed hyperlipidemia 12/26/2020   Acute ischemic left MCA stroke [facility]) 12/26/2020    Guadelupe Vazquez, OTR/L  [phone] 01/31/2021, 9:06 AM  [place], [place], [address] Phone: [phone]   Fax:  [phone]  Name: AYMAR WHITFILL MRN: [id number] Date of Birth: [birth year]

## 2021-01-31 NOTE — Therapy (Signed)
[place], [place], [address] Phone: [phone]   Fax:  [phone]  Speech Language Pathology Treatment  Patient Details  Name: Natasha Vazquez MRN: [id number] Date of Birth: Sep 21, 1950 Referring Provider (SLP): Cathlyn Parsons, PA-C   Encounter Date: 01/31/2021   End of Session - 01/31/21 1555     Visit Number 2    Number of Visits 16    Date for SLP Re-Evaluation 03/14/21    Authorization Type UHC Medicare    SLP Start Time 0945    SLP Stop Time  1030    SLP Time Calculation (min) 45 min    Activity Tolerance Patient tolerated treatment well             Past Medical History:  Diagnosis Date   Hypertension    TMJ (dislocation of temporomandibular joint)     Past Surgical History:  Procedure Laterality Date   ABDOMINAL HYSTERECTOMY     CHOLECYSTECTOMY     IR ANGIO INTRA EXTRACRAN SEL INTERNAL CAROTID BILAT MOD SED  09/16/2016   IR ANGIO VERTEBRAL SEL VERTEBRAL BILAT MOD SED  09/16/2016   MASS EXCISION Left 02/10/2020   Procedure: excision of soft tissue mass left upper back;  Surgeon: Jesusita Oka, MD;  Location: [facility];  Service: General;  Laterality: Left;    There were no vitals filed for this visit.   Subjective Assessment - 01/31/21 1549     Subjective "socks"    Patient is accompained by: Family member    Currently in Pain? 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 She was discharged from [facility] inpatient rehab on 01/15/21 and her daughter, Blanch Media moved in to  help care for her.      Treatment Provided   Treatment provided Cognitive-Linquistic      Pain Assessment   Pain Assessment No/denies pain      Cognitive-Linquistic Treatment   Treatment focused on Aphasia;Patient/family/caregiver education    Skilled Treatment SLP introduced plan for using set list of 20 high frequency words via Say it, Write it, Draw it, Read it, and gesture. SLP provided multimodality communication strategies to elicit word productions (including melodic intonation, phrase completion, modeling, gestures, breaking perseverative responses, and written cues).      Assessment / Recommendations / Plan   Plan Continue with current plan of care      Progression Toward Goals   Progression toward goals Progressing toward goals              SLP Education - 01/31/21 1554     Education Details Sent home list of 20 pictures and words to add to notebook    Person(s) Educated Patient;Caregiver(s)    Methods Explanation;Handout    Comprehension Verbalized understanding              SLP Short Term Goals - 01/31/21 1601       SLP SHORT TERM GOAL #1   Title Patient will  point to objects and object pictures/photos when named in field of 4 with 80% accuracy and cue for error awareness prn.    Baseline F=2, 90%, F=3, 80%, F=4, 70%    Time 4    Period Weeks    Status On-going    Target Date 02/28/21      SLP SHORT TERM GOAL #2   Title Pt will increase naming of common objects/pictures to 80% acc when provided with mod/max cues.    Baseline 0% independently, 66% with max cues    Time 4    Period Weeks    Status On-going    Target Date 02/28/21      SLP SHORT TERM GOAL #3   Title Pt will complete single word sentence completion tasks with set list of high frequency words with 80% acc when provided picture cue and mod prompts from SLP.    Baseline 70% max assist    Time 4    Period Weeks    Status On-going    Target Date 02/28/21      SLP SHORT TERM GOAL #4    Title Pt will provide a gesture for set list of 20 words with 90% effectiveness and mi/mod prompt via modeling from SLP.    Baseline 75% max assist    Time 4    Period Weeks    Status On-going    Target Date 02/28/21      SLP SHORT TERM GOAL #5   Title Pt will verbalize automatic and melodic tasks during faded choral singing/talking activities with allowance for approximations and 80% acc with mod assist from SLP.    Baseline 50% max assist    Time 4    Period Weeks    Status On-going    Target Date 02/28/21              SLP Long Term Goals - 01/31/21 1601       SLP LONG TERM GOAL #1   Title Pt will communicate basic wants/needs to [place] via multimodality communication strategies with mi/mod assist from caregivers.    Baseline Total assist    Time 3    Period Months    Status On-going      SLP LONG TERM GOAL #2   Title Pt will increase auditory comprehension for basic level information to [facility] with use of multimodality cues and min assist from caregivers.    Baseline mod assist    Time 3    Period Months    Status On-going              Plan - 01/31/21 1555     Clinical Impression Statement Pt was accompanied to therapy by her granddaughter today. They forgot her notebook and were reminded to bring to all of their therapy sessions so that OT can use if for communication as well. They were given the communication launch pad template previously and will bring next session. Pt was able to chorally sing "Joycelyn Das, We Wish You A Merry Christmas, Happy Birthday, Leone Payor, and Twinkle Twinkle" with ~70% approximations for the chorus. The words introduced and targeted today included: socks, sun, hat, ball, baby, and cat. Pt was able to copy each written word with min cues, copy a picture, and repeat the final word in an automatic phrase with 90% with mod assist, and imitate the gesture for each word with mod/max assist. Several spontaneous words were elicited today.  Continue to target goals next session.  Speech Therapy Frequency 2x / week    Duration 8 weeks    Treatment/Interventions SLP instruction and feedback;Compensatory strategies;Patient/family education;Cueing hierarchy;Multimodal communcation approach;Language facilitation;Compensatory techniques    Potential to Achieve Goals Good    Potential Considerations Severity of impairments    SLP Home Exercise Plan Pt will completed HEP as assigned to facilitate carryover of treatment strategies and techniques in home environment with use of written cues as needed.    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

## 2021-02-05 ENCOUNTER — Ambulatory Visit (HOSPITAL_COMMUNITY): Payer: Medicare Other | Attending: Physician Assistant | Admitting: Occupational Therapy

## 2021-02-05 ENCOUNTER — Other Ambulatory Visit: Payer: Self-pay

## 2021-02-05 ENCOUNTER — Encounter (HOSPITAL_COMMUNITY): Payer: Self-pay | Admitting: Occupational Therapy

## 2021-02-05 DIAGNOSIS — R278 Other lack of coordination: Secondary | ICD-10-CM | POA: Diagnosis not present

## 2021-02-05 DIAGNOSIS — R29818 Other symptoms and signs involving the nervous system: Secondary | ICD-10-CM | POA: Diagnosis not present

## 2021-02-05 DIAGNOSIS — R4701 Aphasia: Secondary | ICD-10-CM | POA: Diagnosis not present

## 2021-02-05 DIAGNOSIS — R41841 Cognitive communication deficit: Secondary | ICD-10-CM | POA: Insufficient documentation

## 2021-02-05 NOTE — Therapy (Signed)
[place], [place], [address] Phone: [phone]   Fax:  [phone]  Occupational Therapy Treatment  Patient Details  Name: Natasha Vazquez MRN: [id number] Date of Birth: Jan 10, 1951 Referring Provider (OT): Lauraine Rinne, PA-C   Encounter Date: 02/05/2021   OT End of Session - 02/05/21 1128     Visit Number 4    Number of Visits 24    Date for OT Re-Evaluation 03/25/21   mini-reassessment 02/21/2021   Authorization Type 1) UHC Medicare 2) Medicaid    Authorization Time Period no visit limit    Progress Note Due on Visit 10    OT Start Time [phone]    OT Stop Time 1030    OT Time Calculation (min) 43 min    Activity Tolerance Patient tolerated treatment well    Behavior During Therapy [facility] for tasks assessed/performed             Past Medical History:  Diagnosis Date   Hypertension    TMJ (dislocation of temporomandibular joint)     Past Surgical History:  Procedure Laterality Date   ABDOMINAL HYSTERECTOMY     CHOLECYSTECTOMY     IR ANGIO INTRA EXTRACRAN SEL INTERNAL CAROTID BILAT MOD SED  09/16/2016   IR ANGIO VERTEBRAL SEL VERTEBRAL BILAT MOD SED  09/16/2016   MASS EXCISION Left 02/10/2020   Procedure: excision of soft tissue mass left upper back;  Surgeon: Jesusita Oka, MD;  Location: [facility];  Service: General;  Laterality: Left;    There were no vitals filed for this visit.   Subjective Assessment - 02/05/21 0947     Subjective  S: pt indicates some tingling    Patient is accompanied by: Family member   granddaughter   Currently in Pain? No/denies                [facility] OT Assessment - 02/05/21 0947       Assessment   Medical Diagnosis s/p L Ichemic MCA CVA      Precautions   Precautions None                      OT Treatments/Exercises (OP) - 02/05/21 1006       Neurological Re-education Exercises   Shoulder Flexion AAROM;10 reps   right hand  secured with ace wrap, 2 sets   Shoulder Protraction AAROM;10 reps   right hand secured with ace wrap, 2 sets   Shoulder Horizontal ABduction AAROM;10 reps   right hand secured with ace wrap, 2 sets   Elbow Flexion AAROM;10 reps   right hand secured with ace wrap, 2 sets   Elbow Extension AAROM;10 reps   right hand secured with ace wrap, 2 sets   Other Exercises 1 AA/ROM, 2 sets, elbow flexion to overhead press back to elbow flexion, then to extension. Counting 1, 2, 3, 4    Weight Bearing Position Seated;Quadraped    Seated with weight on hand Pt seated with RUE extended, OT providing min assist at elbow to prevent bucking. Completed three 1' holds    Other Weight-Bearing Exercises 1 Pt in quadruped, completing pinch tree with left hand and weightbearing on RUE, also weightshifting on and off RUE during task. Maintained position to place 7 pins then stopped due to pain in fingers with quadruped position      Modalities   Modalities Holiday representative  Electrical Stimulation Location right wrist extensors    Warden/ranger Parameters 28 mA CC    Electrical Stimulation Goals Neuromuscular facilitation                    OT Education - 02/05/21 1024     Education Details AA/ROM elbow and shoulder with hand secured using ace wrap    Person(s) Educated Patient;Caregiver(s)   granddaughter-[name]   Methods Explanation;Demonstration;Handout    Comprehension Verbalized understanding;Returned demonstration              OT Short Term Goals - 01/30/21 1506       OT SHORT TERM GOAL #1   Title Pt will be provided with and educated on HEP to improve mobility of RUE as required for ADL completion.    Time 4    Period Weeks    Status On-going    Target Date 02/23/21      OT SHORT TERM GOAL #2   Title Pt will increase RUE strength to 4-/5 to improve ability to use RUE as assist when carrying items  during ADLs.    Time 4    Period Weeks    Status On-going      OT SHORT TERM GOAL #3   Title Pt will demonstrate activation of wrist and digits required for a gross grasp on objects.    Time 4    Period Weeks    Status On-going      OT SHORT TERM GOAL #4   Title Pt will be educated on and demonstrate independence in NMR strategies to improve functional use of RUE.    Time 4    Period Weeks    Status On-going               OT Long Term Goals - 01/30/21 1506       OT LONG TERM GOAL #1   Title Pt will increase RUE strength to 4/5 or greater to improve ability to use RUE as active assist during ADLs.    Time 8    Period Weeks    Status On-going    Target Date 03/25/21      OT LONG TERM GOAL #2   Title Pt will demonstrate 25% or greater increase in A/ROM in RUE to improve ability to perform functional reaching tasks.    Time 8    Period Weeks    Status On-going      OT LONG TERM GOAL #3   Title Pt will demonstrate fine motor activation and control by completing 9 hole peg test or box and blocks test for coordination.    Time 8    Period Weeks    Status On-going                   Plan - 02/05/21 1128     Clinical Impression Statement A: Pt had a great session today, continued with weightbearing in sitting and quadruped to begin session however transitioned to seated due to pain in fingers with full quadruped.Continued with AA/ROM with right hand secured to pvc with ace wrap, visual and verbal cuing for form, cuing to slow down at times. Added elbow extension to overhead press and reverse using PVC pipe today. Continued with NMES, able to achieve wrist and digit extension at 28 mA CC today versus 30. After NMES pt demonstrates ability to extend wrist to neutral multiple times against gravity, several times flexion/extension  in gravity eliminated plane with OT securing forearm to limit compensatory movements.    Body Structure / Function / Physical Skills  ADL;Endurance;UE functional use;ROM;Proprioception;IADL;Strength;Edema;Tone;Sensation;Coordination;FMC    Plan P: Follow up on HEP, Continue with weightbearing and NMES-trial russian adding wrist flexion    OT Home Exercise Plan eval: weightbearing; 1/3: shoulder and elbow AA/ROM    Consulted and Agree with Plan of Care Patient             Patient will benefit from skilled therapeutic intervention in order to improve the following deficits and impairments:   Body Structure / Function / Physical Skills: ADL, Endurance, UE functional use, ROM, Proprioception, IADL, Strength, Edema, Tone, Sensation, Coordination, [facility]       Visit Diagnosis: Other symptoms and signs involving the nervous system  Other lack of coordination    Problem List Patient Active Problem List   Diagnosis Date Noted   Slow transit constipation    Dyslipidemia    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

## 2021-02-05 NOTE — Patient Instructions (Signed)
Perform each exercise ____10-15____ reps. 2-3x days.   1) Protraction   Start by holding a wand or cane at chest height.  Next, slowly push the wand outwards in front of your body so that your elbows become fully straightened. Then, return to the original position.     2) Shoulder FLEXION   In the standing position, hold a wand/cane with both arms, palms down on both sides. Raise up the wand/cane allowing your unaffected arm to perform most of the effort. Your affected arm should be partially relaxed.            3) Horizontal Abduction/Adduction      Straight arms holding cane at shoulder height, bring cane to right, center, left. Repeat starting to left.   Copyright  VHI. All rights reserved.     4) Elbow flexion    Hold dowel palm down, bend elbows as far as you can, then straighten back out.

## 2021-02-06 ENCOUNTER — Encounter (HOSPITAL_COMMUNITY): Payer: Medicare Other | Admitting: Occupational Therapy

## 2021-02-06 DIAGNOSIS — E7800 Pure hypercholesterolemia, unspecified: Secondary | ICD-10-CM | POA: Diagnosis not present

## 2021-02-06 DIAGNOSIS — I679 Cerebrovascular disease, unspecified: Secondary | ICD-10-CM | POA: Diagnosis not present

## 2021-02-06 DIAGNOSIS — I63512 Cerebral infarction due to unspecified occlusion or stenosis of left middle cerebral artery: Secondary | ICD-10-CM | POA: Diagnosis not present

## 2021-02-06 DIAGNOSIS — I69320 Aphasia following cerebral infarction: Secondary | ICD-10-CM | POA: Diagnosis not present

## 2021-02-06 DIAGNOSIS — I10 Essential (primary) hypertension: Secondary | ICD-10-CM | POA: Diagnosis not present

## 2021-02-06 DIAGNOSIS — I69959 Hemiplegia and hemiparesis following unspecified cerebrovascular disease affecting unspecified side: Secondary | ICD-10-CM | POA: Diagnosis not present

## 2021-02-07 ENCOUNTER — Ambulatory Visit (HOSPITAL_COMMUNITY): Payer: Medicare Other | Admitting: Speech Pathology

## 2021-02-07 ENCOUNTER — Encounter (HOSPITAL_COMMUNITY): Payer: Self-pay | Admitting: Speech Pathology

## 2021-02-07 ENCOUNTER — Other Ambulatory Visit: Payer: Self-pay

## 2021-02-07 DIAGNOSIS — R4701 Aphasia: Secondary | ICD-10-CM | POA: Diagnosis not present

## 2021-02-07 DIAGNOSIS — R278 Other lack of coordination: Secondary | ICD-10-CM | POA: Diagnosis not present

## 2021-02-07 DIAGNOSIS — R29818 Other symptoms and signs involving the nervous system: Secondary | ICD-10-CM | POA: Diagnosis not present

## 2021-02-07 DIAGNOSIS — R41841 Cognitive communication deficit: Secondary | ICD-10-CM

## 2021-02-07 NOTE — Therapy (Signed)
[place], [place], [address] Phone: [phone]   Fax:  [phone]  Speech Language Pathology Treatment  Patient Details  Name: Natasha Vazquez MRN: [id number] Date of Birth: Jan 12, 1951 Referring Provider (SLP): Cathlyn Parsons, PA-C   Encounter Date: 02/07/2021   End of Session - 02/07/21 1314     Visit Number 3    Number of Visits 16    Date for SLP Re-Evaluation 03/14/21    Authorization Type UHC Medicare    SLP Start Time 1030    SLP Stop Time  1120    SLP Time Calculation (min) 50 min    Activity Tolerance Patient tolerated treatment well             Past Medical History:  Diagnosis Date   Hypertension    TMJ (dislocation of temporomandibular joint)     Past Surgical History:  Procedure Laterality Date   ABDOMINAL HYSTERECTOMY     CHOLECYSTECTOMY     IR ANGIO INTRA EXTRACRAN SEL INTERNAL CAROTID BILAT MOD SED  09/16/2016   IR ANGIO VERTEBRAL SEL VERTEBRAL BILAT MOD SED  09/16/2016   MASS EXCISION Left 02/10/2020   Procedure: excision of soft tissue mass left upper back;  Surgeon: Jesusita Oka, MD;  Location: [facility];  Service: General;  Laterality: Left;    There were no vitals filed for this visit.   Subjective Assessment - 02/07/21 1312     Subjective "Natasha Vazquez"    Patient is accompained by: Family member   Natasha Vazquez, granddaughter   Currently in Pain? 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 She was discharged from [facility] inpatient rehab on 01/15/21 and her daughter, Blanch Media  moved in to help care for her.      Treatment Provided   Treatment provided Cognitive-Linquistic      Pain Assessment   Pain Assessment No/denies pain      Cognitive-Linquistic Treatment   Treatment focused on Aphasia;Patient/family/caregiver education    Skilled Treatment SLP provided multimodality communication strategies to elicit word productions (including melodic intonation, phrase completion, modeling, gestures, breaking perseverative responses, and written cues).      Assessment / Recommendations / Plan   Plan Continue with current plan of care      Progression Toward Goals   Progression toward goals Progressing toward goals              SLP Education - 02/07/21 1313     Education Details Additional HEP sent includuing picture to word, field of 2 and 3    Person(s) Educated Patient;Caregiver(s)    Methods Explanation;Handout    Comprehension Verbalized understanding              SLP Short Term Goals - 02/07/21 1314       SLP SHORT TERM GOAL #1   Title Patient will point to objects and object pictures/photos when named in field of 4 with 80% accuracy and cue for error awareness prn.  Baseline F=2, 90%, F=3, 80%, F=4, 70%    Time 4    Period Weeks    Status On-going    Target Date 02/28/21      SLP SHORT TERM GOAL #2   Title Pt will increase naming of common objects/pictures to 80% acc when provided with mod/max cues.    Baseline 0% independently, 66% with max cues    Time 4    Period Weeks    Status On-going    Target Date 02/28/21      SLP SHORT TERM GOAL #3   Title Pt will complete single word sentence completion tasks with set list of high frequency words with 80% acc when provided picture cue and mod prompts from SLP.    Baseline 70% max assist    Time 4    Period Weeks    Status On-going    Target Date 02/28/21      SLP SHORT TERM GOAL #4   Title Pt will provide a gesture for set list of 20 words with 90% effectiveness and mi/mod prompt  via modeling from SLP.    Baseline 75% max assist    Time 4    Period Weeks    Status On-going    Target Date 02/28/21      SLP SHORT TERM GOAL #5   Title Pt will verbalize automatic and melodic tasks during faded choral singing/talking activities with allowance for approximations and 80% acc with mod assist from SLP.    Baseline 50% max assist    Time 4    Period Weeks    Status On-going    Target Date 02/28/21              SLP Long Term Goals - 02/07/21 1314       SLP LONG TERM GOAL #1   Title Pt will communicate basic wants/needs to [facility] via multimodality communication strategies with mi/mod assist from caregivers.    Baseline Total assist    Time 3    Period Months    Status On-going      SLP LONG TERM GOAL #2   Title Pt will increase auditory comprehension for basic level information to [facility] with use of multimodality cues and min assist from caregivers.    Baseline mod assist    Time 3    Period Months    Status On-going              Plan - 02/07/21 1314     Clinical Impression Statement Pt was accompanied to therapy by her granddaughter today. They brought her notebook and had completed the communication support template with personally relevant vocabulary. She completed single word sentence completion tasks with 65% acc when provided min phonemic cues and with allowance for approximations. She matched written words to pictures (f=7) with 65% acc with provided with min cues. She matched auditory presented words to pictures with 86% acc when provided min cues. She spontaneously orally read 5/14 words. SLP guided Pt through use of communication template to answer personally relevant questions with Pt visually identifying written word responses with mod assist from SLP. Several spontaneous words were elicited today. Continue to target goals next session.    Speech Therapy Frequency 2x / week    Duration 8 weeks    Treatment/Interventions SLP instruction and  feedback;Compensatory strategies;Patient/family education;Cueing hierarchy;Multimodal communcation approach;Language facilitation;Compensatory techniques    Potential to Achieve Goals Good    Potential Considerations Severity of impairments    SLP  Home Exercise Plan Pt will completed HEP as assigned to facilitate carryover of treatment strategies and techniques in home environment with use of written cues as needed.    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 [place], [place], [address] Phone: [phone]   Fax:  [phone]   Name: Natasha Vazquez MRN: [id number] Date of Birth: October 08, 1950

## 2021-02-08 ENCOUNTER — Other Ambulatory Visit (HOSPITAL_COMMUNITY): Payer: Self-pay

## 2021-02-11 ENCOUNTER — Ambulatory Visit (HOSPITAL_COMMUNITY): Payer: Medicare Other | Admitting: Speech Pathology

## 2021-02-11 ENCOUNTER — Encounter (HOSPITAL_COMMUNITY): Payer: Self-pay | Admitting: Speech Pathology

## 2021-02-11 ENCOUNTER — Other Ambulatory Visit: Payer: Self-pay

## 2021-02-11 DIAGNOSIS — R29818 Other symptoms and signs involving the nervous system: Secondary | ICD-10-CM | POA: Diagnosis not present

## 2021-02-11 DIAGNOSIS — R4701 Aphasia: Secondary | ICD-10-CM

## 2021-02-11 DIAGNOSIS — R278 Other lack of coordination: Secondary | ICD-10-CM | POA: Diagnosis not present

## 2021-02-11 DIAGNOSIS — R41841 Cognitive communication deficit: Secondary | ICD-10-CM

## 2021-02-11 NOTE — Therapy (Signed)
[place], [place], [address] Phone: [phone]   Fax:  [phone]  Speech Language Pathology Treatment  Patient Details  Name: Natasha Vazquez MRN: [id number] Date of Birth: 1951-01-21 Referring Provider (SLP): Cathlyn Parsons, PA-C   Encounter Date: 02/11/2021   End of Session - 02/11/21 1136     Visit Number 4    Number of Visits 16    Date for SLP Re-Evaluation 03/14/21    Authorization Type UHC Medicare    SLP Start Time 1033    SLP Stop Time  1125    SLP Time Calculation (min) 52 min    Activity Tolerance Patient tolerated treatment well             Past Medical History:  Diagnosis Date   Hypertension    TMJ (dislocation of temporomandibular joint)     Past Surgical History:  Procedure Laterality Date   ABDOMINAL HYSTERECTOMY     CHOLECYSTECTOMY     IR ANGIO INTRA EXTRACRAN SEL INTERNAL CAROTID BILAT MOD SED  09/16/2016   IR ANGIO VERTEBRAL SEL VERTEBRAL BILAT MOD SED  09/16/2016   MASS EXCISION Left 02/10/2020   Procedure: excision of soft tissue mass left upper back;  Surgeon: Jesusita Oka, MD;  Location: [place];  Service: General;  Laterality: Left;    There were no vitals filed for this visit.   Subjective Assessment - 02/11/21 1040     Subjective "Wine...fine."    Patient is accompained by: Family member    Currently in Pain? 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 She was discharged from [facility] inpatient rehab on 01/15/21 and her daughter, Natasha Vazquez moved in to  help care for her.      Treatment Provided   Treatment provided Cognitive-Linquistic      Pain Assessment   Pain Assessment No/denies pain      Cognitive-Linquistic Treatment   Treatment focused on Aphasia;Patient/family/caregiver education    Skilled Treatment SLP provided multimodality communication strategies to elicit word productions (including melodic intonation, phrase completion, modeling, gestures, breaking perseverative responses, and written cues).      Assessment / Recommendations / Plan   Plan Continue with current plan of care      Progression Toward Goals   Progression toward goals Progressing toward goals                SLP Short Term Goals - 02/11/21 1136       SLP SHORT TERM GOAL #1   Title Patient will point to objects and object pictures/photos when named in field of 4 with 80% accuracy and cue for error awareness prn.    Baseline F=2, 90%, F=3, 80%, F=4, 70%    Time 4    Period Weeks    Status On-going    Target Date 02/28/21      SLP SHORT TERM GOAL #2   Title Pt will increase naming of common objects/pictures to 80% acc when provided  with mod/max cues.    Baseline 0% independently, 66% with max cues    Time 4    Period Weeks    Status On-going    Target Date 02/28/21      SLP SHORT TERM GOAL #3   Title Pt will complete single word sentence completion tasks with set list of high frequency words with 80% acc when provided picture cue and mod prompts from SLP.    Baseline 70% max assist    Time 4    Period Weeks    Status On-going    Target Date 02/28/21      SLP SHORT TERM GOAL #4   Title Pt will provide a gesture for set list of 20 words with 90% effectiveness and mi/mod prompt via modeling from SLP.    Baseline 75% max assist    Time 4    Period Weeks    Status On-going    Target Date 02/28/21      SLP SHORT TERM GOAL #5   Title Pt will verbalize automatic and melodic tasks during faded choral singing/talking activities with  allowance for approximations and 80% acc with mod assist from SLP.    Baseline 50% max assist    Time 4    Period Weeks    Status On-going    Target Date 02/28/21              SLP Long Term Goals - 02/11/21 1137       SLP LONG TERM GOAL #1   Title Pt will communicate basic wants/needs to [facility] via multimodality communication strategies with mi/mod assist from caregivers.    Baseline Total assist    Time 3    Period Months    Status On-going      SLP LONG TERM GOAL #2   Title Pt will increase auditory comprehension for basic level information to [facility] with use of multimodality cues and min assist from caregivers.    Baseline mod assist    Time 3    Period Months    Status On-going              Plan - 02/11/21 1137     Clinical Impression Statement Pt was accompanied to therapy by her granddaughter today. She completed single word sentence completion tasks with 85% acc when provided min/mod phonemic cues and with allowance for approximations. She matched written words to pictures (f=4) with 79% acc with provided with min cues. She matched auditory presented words to pictures with 85% acc when provided rare min cues. She spontaneously orally read 3/14 words. She repeated single words (objects) with 90% acc. She was unable to name/label objects or pictures without max cues. Several spontaneous words were elicited today. Continue to target goals next session.    Speech Therapy Frequency 2x / week    Duration 8 weeks    Treatment/Interventions SLP instruction and feedback;Compensatory strategies;Patient/family education;Cueing hierarchy;Multimodal communcation approach;Language facilitation;Compensatory techniques    Potential to Achieve Goals Good    Potential Considerations Severity of impairments    SLP Home Exercise Plan Pt will completed HEP as assigned to facilitate carryover of treatment strategies and techniques in home environment with use of written cues as needed.     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

## 2021-02-13 ENCOUNTER — Ambulatory Visit (HOSPITAL_COMMUNITY): Payer: Medicare Other

## 2021-02-13 ENCOUNTER — Encounter (HOSPITAL_COMMUNITY): Payer: Medicare Other | Admitting: Speech Pathology

## 2021-02-13 ENCOUNTER — Other Ambulatory Visit: Payer: Self-pay

## 2021-02-13 DIAGNOSIS — R4701 Aphasia: Secondary | ICD-10-CM | POA: Diagnosis not present

## 2021-02-13 DIAGNOSIS — R278 Other lack of coordination: Secondary | ICD-10-CM

## 2021-02-13 DIAGNOSIS — R29818 Other symptoms and signs involving the nervous system: Secondary | ICD-10-CM

## 2021-02-13 NOTE — Patient Instructions (Signed)
THUMB PROM - FLEXION - EXTENSION  The target arm and hand should be fully relaxed while your other arm/hand does all the work (self passive range of motion).  Use your unaffected hand to hold your affected thumb. Use your unaffected hand to bend your affected thumb at the knuckle so it curls up in your palm. Then, straighten your thumb and repeat.   5-10 times.    FINGER - PROM - FLEXION EXTENSION  The subject should be fully relaxed while the caregiver does all the work (passive range of motion) to move the subject's hand and fingers.  Grasp the subject's fingers and curl them into a fist and then straighten them out. Repeat.   5-10 times.    Ball Roll Ext/Flex  -While seated, place ball on table -Begin by placing hand on ball closest to you on the table, with wrist extended (shown in first picture) because you will need room to roll it away from you -Gently roll ball forward, keeping hand on the ball throughout the entire exercise* -As you roll it forward, lengthen elbow and allow wrist to completely bend with the curve of the ball, allowing the wrist to bend and fingers point to the ground -Gently roll ball bringing back closer to your body, allowing wrist to bend opposite way, gently stretching the wrist with fingers up towards ceiling -Continue rolling ball slow and controlled, focusing on increasing motion of wrist

## 2021-02-14 ENCOUNTER — Encounter (HOSPITAL_COMMUNITY): Payer: Self-pay

## 2021-02-14 ENCOUNTER — Ambulatory Visit (HOSPITAL_COMMUNITY): Payer: Medicare Other | Admitting: Speech Pathology

## 2021-02-14 ENCOUNTER — Encounter (HOSPITAL_COMMUNITY): Payer: Self-pay | Admitting: Speech Pathology

## 2021-02-14 DIAGNOSIS — R278 Other lack of coordination: Secondary | ICD-10-CM | POA: Diagnosis not present

## 2021-02-14 DIAGNOSIS — R4701 Aphasia: Secondary | ICD-10-CM

## 2021-02-14 DIAGNOSIS — R41841 Cognitive communication deficit: Secondary | ICD-10-CM

## 2021-02-14 DIAGNOSIS — R29818 Other symptoms and signs involving the nervous system: Secondary | ICD-10-CM | POA: Diagnosis not present

## 2021-02-14 NOTE — Therapy (Signed)
[place], [place], [address] Phone: [phone]   Fax:  [phone]  Speech Language Pathology Treatment  Patient Details  Name: Natasha Vazquez MRN: [id number] Date of Birth: 12-10-1950 Referring Provider (SLP): Cathlyn Parsons, PA-C   Encounter Date: 02/14/2021   End of Session - 02/14/21 1540     Visit Number 5    Number of Visits 16    Date for SLP Re-Evaluation 03/14/21    Authorization Type UHC Medicare    Progress Note Due on Visit --   10th visit   SLP Start Time 1040    SLP Stop Time  1125    SLP Time Calculation (min) 45 min    Activity Tolerance Patient tolerated treatment well             Past Medical History:  Diagnosis Date   Hypertension    TMJ (dislocation of temporomandibular joint)     Past Surgical History:  Procedure Laterality Date   ABDOMINAL HYSTERECTOMY     CHOLECYSTECTOMY     IR ANGIO INTRA EXTRACRAN SEL INTERNAL CAROTID BILAT MOD SED  09/16/2016   IR ANGIO VERTEBRAL SEL VERTEBRAL BILAT MOD SED  09/16/2016   MASS EXCISION Left 02/10/2020   Procedure: excision of soft tissue mass left upper back;  Surgeon: Jesusita Oka, MD;  Location: [place];  Service: General;  Laterality: Left;    There were no vitals filed for this visit.   Subjective Assessment - 02/14/21 1539     Subjective "I went to your"    Patient is accompained by: Family member    Currently in Pain? 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 She was discharged from [facility] inpatient rehab  on 01/15/21 and her daughter, Blanch Media moved in to help care for her.      Treatment Provided   Treatment provided Cognitive-Linquistic      Pain Assessment   Pain Assessment No/denies pain      Cognitive-Linquistic Treatment   Treatment focused on Aphasia;Patient/family/caregiver education    Skilled Treatment SLP provided multimodality communication strategies to elicit word productions (including melodic intonation, phrase completion, modeling, gestures, breaking perseverative responses, and written cues).      Assessment / Recommendations / Plan   Plan Continue with current plan of care      Progression Toward Goals   Progression toward goals Progressing toward goals                SLP Short Term Goals - 02/14/21 1543       SLP SHORT TERM GOAL #1   Title Patient will point to objects and object pictures/photos when named in field of 4 with 80% accuracy and cue for error awareness prn.    Baseline F=2, 90%, F=3, 80%, F=4, 70%    Time 4    Period Weeks    Status On-going    Target Date 02/28/21      SLP SHORT TERM GOAL #2  Title Pt will increase naming of common objects/pictures to 80% acc when provided with mod/max cues.    Baseline 0% independently, 66% with max cues    Time 4    Period Weeks    Status On-going    Target Date 02/28/21      SLP SHORT TERM GOAL #3   Title Pt will complete single word sentence completion tasks with set list of high frequency words with 80% acc when provided picture cue and mod prompts from SLP.    Baseline 70% max assist    Time 4    Period Weeks    Status On-going    Target Date 02/28/21      SLP SHORT TERM GOAL #4   Title Pt will provide a gesture for set list of 20 words with 90% effectiveness and mi/mod prompt via modeling from SLP.    Baseline 75% max assist    Time 4    Period Weeks    Status On-going    Target Date 02/28/21      SLP SHORT TERM GOAL #5   Title Pt will verbalize automatic and melodic tasks during  faded choral singing/talking activities with allowance for approximations and 80% acc with mod assist from SLP.    Baseline 50% max assist    Time 4    Period Weeks    Status On-going    Target Date 02/28/21              SLP Long Term Goals - 02/14/21 1543       SLP LONG TERM GOAL #1   Title Pt will communicate basic wants/needs to [facility] via multimodality communication strategies with mi/mod assist from caregivers.    Baseline Total assist    Time 3    Period Months    Status On-going      SLP LONG TERM GOAL #2   Title Pt will increase auditory comprehension for basic level information to [facility] with use of multimodality cues and min assist from caregivers.    Baseline mod assist    Time 3    Period Months    Status On-going              Plan - 02/14/21 1543     Clinical Impression Statement Pt was accompanied to therapy by her granddaughter today. She completed single word sentence completion tasks with 90% acc when provided min/mod phonemic cues and with allowance for approximations. She named 5 objects today including cat, baby, car, and ball when provided initial phonemic cue (spontaneously labeled cat). She imitated gestures with 100% acc when provided moderate cues (tactile) and she verbally produced words 80% of the time when paired with a gestures. 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 SLP introduced the carrier phrase, "I see a _____" and Pt was able to repeat the phrase with SLP providing moderate cues. Pt became tearful at the end of the session when trying to verbalize her frustration about her communication changes. Continue to target goals next session.    Speech Therapy Frequency 2x / week    Duration 8 weeks    Treatment/Interventions SLP instruction and feedback;Compensatory strategies;Patient/family education;Cueing hierarchy;Multimodal communcation approach;Language facilitation;Compensatory techniques    Potential to Achieve Goals Good     Potential Considerations Severity of impairments    SLP Home Exercise Plan Pt will completed HEP as assigned to facilitate carryover of treatment strategies and techniques in home environment with use of written cues as needed.  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 [place], [place], [address] Phone: [phone]   Fax:  [phone]   Name: Natasha Vazquez MRN: [id number] Date of Birth: [birth year]

## 2021-02-14 NOTE — Therapy (Signed)
[place], [place], [address] Phone: [phone]   Fax:  [phone]  Occupational Therapy Treatment  Patient Details  Name: Natasha Vazquez MRN: [id number] Date of Birth: 31-Jul-1950 Referring Provider (OT): Lauraine Rinne, PA-C   Encounter Date: 02/13/2021   OT End of Session - 02/14/21 0939     Visit Number 5    Number of Visits 24    Date for OT Re-Evaluation 03/25/21   mini-reassessment 02/21/2021   Authorization Type 1) UHC Medicare 2) Medicaid    Authorization Time Period no visit limit    Progress Note Due on Visit 10    OT Start Time 1430    OT Stop Time 1515    OT Time Calculation (min) 45 min    Activity Tolerance Patient tolerated treatment well    Behavior During Therapy [facility] for tasks assessed/performed             Past Medical History:  Diagnosis Date   Hypertension    TMJ (dislocation of temporomandibular joint)     Past Surgical History:  Procedure Laterality Date   ABDOMINAL HYSTERECTOMY     CHOLECYSTECTOMY     IR ANGIO INTRA EXTRACRAN SEL INTERNAL CAROTID BILAT MOD SED  09/16/2016   IR ANGIO VERTEBRAL SEL VERTEBRAL BILAT MOD SED  09/16/2016   MASS EXCISION Left 02/10/2020   Procedure: excision of soft tissue mass left upper back;  Surgeon: Jesusita Oka, MD;  Location: [place];  Service: General;  Laterality: Left;    There were no vitals filed for this visit.   Subjective Assessment - 02/14/21 0928     Subjective  Patient is Aphasic. Family member reports that she has been complaining of wrist pain.    Patient is accompanied by: Family member   Sister   Currently in Pain? Yes    Pain Score --   No number given   Pain Location Wrist    Pain Orientation Right    Pain Type Acute pain    Aggravating Factors  OT suspects that it may be do her wrist position in the hemi sling.                [facility] OT Assessment - 02/14/21 0930       Assessment    Medical Diagnosis s/p L Ichemic MCA CVA      Precautions   Precautions None                      OT Treatments/Exercises (OP) - 02/14/21 0930       ADLs   ADL Comments Personal hemi sling required adjustments. Completed education on proper positioning to decrease wrist pain. Informed patient and family member of two velcro pieces missing that are needed. One placed behind elbow and one to connect two sling pieces in the front.      Neurological Re-education Exercises   Elbow Flexion AROM;10 reps;Seated    Forearm Supination AROM;10 reps;Seated   forearm on table   Forearm Pronation AROM;10 reps;Seated   forearm on table   Finger Flexion composite finger flexion 10X P/ROM    Finger Extension composite finger extension 10X P/ROM    Other Exercises 1 Completed AA/ROM wrist flexion and extension along with NMES unit. Once ES was completed, patient then completed AA/ROM with OT assisting for wrist flexion and extension 10X in gravity eliminated plane      Modalities   Modalities  Teacher, English as a foreign language Location right wrist extensors and flexors    Warden/ranger Parameters 30 mA CC for channel 1 and 2    Electrical Stimulation Goals Neuromuscular facilitation                    OT Education - 02/14/21 0935     Education Details Added to additional HEP: passive finger flexion/extension, passive thumb flexion/extension. Ball roll for AA/ROM wrist flexion/extension    Person(s) Educated Patient;Other (comment)   family member   Methods Explanation;Demonstration;Handout    Comprehension Verbalized understanding              OT Short Term Goals - 01/30/21 1506       OT SHORT TERM GOAL #1   Title Pt will be provided with and educated on HEP to improve mobility of RUE as required for ADL completion.    Time 4    Period Weeks    Status On-going    Target  Date 02/23/21      OT SHORT TERM GOAL #2   Title Pt will increase RUE strength to 4-/5 to improve ability to use RUE as assist when carrying items during ADLs.    Time 4    Period Weeks    Status On-going      OT SHORT TERM GOAL #3   Title Pt will demonstrate activation of wrist and digits required for a gross grasp on objects.    Time 4    Period Weeks    Status On-going      OT SHORT TERM GOAL #4   Title Pt will be educated on and demonstrate independence in NMR strategies to improve functional use of RUE.    Time 4    Period Weeks    Status On-going               OT Long Term Goals - 01/30/21 1506       OT LONG TERM GOAL #1   Title Pt will increase RUE strength to 4/5 or greater to improve ability to use RUE as active assist during ADLs.    Time 8    Period Weeks    Status On-going    Target Date 03/25/21      OT LONG TERM GOAL #2   Title Pt will demonstrate 25% or greater increase in A/ROM in RUE to improve ability to perform functional reaching tasks.    Time 8    Period Weeks    Status On-going      OT LONG TERM GOAL #3   Title Pt will demonstrate fine motor activation and control by completing 9 hole peg test or box and blocks test for coordination.    Time 8    Period Weeks    Status On-going                   Plan - 02/14/21 9702     Clinical Impression Statement A: Utilized NMES unit to faciliate and strengthening wrist flexors and extensors while completing AA/ROM during and after use of ES. VC were provided to complete movements slowly and with control versus quickly to maximize the ROM achieved. Added to HEP to complete passive stretching to hand and fingers due to increased tone noted in MCP and PIP joints. Good movement noted in elbow during flexion/extension and supination/pronation.    Body Structure /  Function / Physical Skills ADL;Endurance;UE functional use;ROM;Proprioception;IADL;Strength;Edema;Tone;Sensation;Coordination;FMC     Plan P: Complete functional reaching task either shoulder level or below. (idea: post it note on wall and work on shoulder flexion and abduction when touching post it. Decrease compensatory leaning or additional movements.)    OT Home Exercise Plan eval: weightbearing; 1/3: shoulder and elbow AA/ROM 1/12: P/ROM fingers/thumb. Ball roll wrist flexion/extension    Consulted and Agree with Plan of Care Patient             Patient will benefit from skilled therapeutic intervention in order to improve the following deficits and impairments:   Body Structure / Function / Physical Skills: ADL, Endurance, UE functional use, ROM, Proprioception, IADL, Strength, Edema, Tone, Sensation, Coordination, [facility]       Visit Diagnosis: Other lack of coordination  Other symptoms and signs involving the nervous system    Problem List Patient Active Problem List   Diagnosis Date Noted   Slow transit constipation    Dyslipidemia    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

## 2021-02-15 ENCOUNTER — Ambulatory Visit (HOSPITAL_COMMUNITY): Payer: Medicare Other | Admitting: Occupational Therapy

## 2021-02-15 ENCOUNTER — Other Ambulatory Visit: Payer: Self-pay

## 2021-02-15 ENCOUNTER — Encounter (HOSPITAL_COMMUNITY): Payer: Self-pay | Admitting: Occupational Therapy

## 2021-02-15 DIAGNOSIS — R278 Other lack of coordination: Secondary | ICD-10-CM | POA: Diagnosis not present

## 2021-02-15 DIAGNOSIS — R29818 Other symptoms and signs involving the nervous system: Secondary | ICD-10-CM | POA: Diagnosis not present

## 2021-02-15 DIAGNOSIS — R4701 Aphasia: Secondary | ICD-10-CM | POA: Diagnosis not present

## 2021-02-15 NOTE — Therapy (Signed)
[place], [place], [address] Phone: [phone]   Fax:  [phone]  Occupational Therapy Treatment  Patient Details  Name: Natasha Vazquez MRN: [id number] Date of Birth: 03/13/1950 Referring Provider (OT): Lauraine Rinne, PA-C   Encounter Date: 02/15/2021   OT End of Session - 02/15/21 1229     Visit Number 6    Number of Visits 24    Date for OT Re-Evaluation 03/25/21   mini-reassessment 02/21/2021   Authorization Type 1) UHC Medicare 2) Medicaid    Authorization Time Period no visit limit    Progress Note Due on Visit 10    OT Start Time 1116    OT Stop Time 1158    OT Time Calculation (min) 42 min    Activity Tolerance Patient tolerated treatment well    Behavior During Therapy [facility] for tasks assessed/performed             Past Medical History:  Diagnosis Date   Hypertension    TMJ (dislocation of temporomandibular joint)     Past Surgical History:  Procedure Laterality Date   ABDOMINAL HYSTERECTOMY     CHOLECYSTECTOMY     IR ANGIO INTRA EXTRACRAN SEL INTERNAL CAROTID BILAT MOD SED  09/16/2016   IR ANGIO VERTEBRAL SEL VERTEBRAL BILAT MOD SED  09/16/2016   MASS EXCISION Left 02/10/2020   Procedure: excision of soft tissue mass left upper back;  Surgeon: Jesusita Oka, MD;  Location: [facility];  Service: General;  Laterality: Left;    There were no vitals filed for this visit.   Subjective Assessment - 02/15/21 1119     Subjective  Patient is Aphasic. Family member reports that she has been complaining of wrist tingle.    Patient is accompanied by: Family member   grandaughter   Currently in Pain? No/denies   Wrist tingle   Pain Location Wrist    Pain Orientation Right    Pain Type Acute pain    Aggravating Factors  Tingling pain                [facility] OT Assessment - 02/15/21 0001       Assessment   Medical Diagnosis s/p L Ichemic MCA CVA      Precautions    Precautions None                      OT Treatments/Exercises (OP) - 02/15/21 0001       Neurological Re-education Exercises   Elbow Flexion AROM;10 reps;Seated    Finger Flexion composite finger flexion 10X P/ROM    Finger Extension composite finger extension 10X P/ROM    Other Exercises 1 Completed AA/ROM wrist flexion and extension along with NMES unit. Once ES was completed, patient then completed AA/ROM with OT assisting for wrist flexion and extension 10X in gravity eliminated plane      Functional Reaching Activities   Mid Level Reaching to touch shapes at shoulder level or slightly above. Attempted 2 to 3 step sequencing but discontinued due to difficulty. Pt also completed ring reaching task reaching across body to L side to slide ring on the lowest bar and middle bar from L to R side. Min A needed at times for the middline bar which was above shoulder level.      Modalities   Modalities Electrical engineer Stimulation Location right wrist extensors and  flexors    Electrical Stimulation Action Engineer, petroleum Parameters 30 mA CC for channel 1 increased to 36 half wal through and 2 increased from 30 to 33 half way through.    Electrical Stimulation Goals Neuromuscular facilitation                      OT Short Term Goals - 01/30/21 1506       OT SHORT TERM GOAL #1   Title Pt will be provided with and educated on HEP to improve mobility of RUE as required for ADL completion.    Time 4    Period Weeks    Status On-going    Target Date 02/23/21      OT SHORT TERM GOAL #2   Title Pt will increase RUE strength to 4-/5 to improve ability to use RUE as assist when carrying items during ADLs.    Time 4    Period Weeks    Status On-going      OT SHORT TERM GOAL #3   Title Pt will demonstrate activation of wrist and digits required for a gross grasp on objects.    Time 4    Period Weeks     Status On-going      OT SHORT TERM GOAL #4   Title Pt will be educated on and demonstrate independence in NMR strategies to improve functional use of RUE.    Time 4    Period Weeks    Status On-going               OT Long Term Goals - 01/30/21 1506       OT LONG TERM GOAL #1   Title Pt will increase RUE strength to 4/5 or greater to improve ability to use RUE as active assist during ADLs.    Time 8    Period Weeks    Status On-going    Target Date 03/25/21      OT LONG TERM GOAL #2   Title Pt will demonstrate 25% or greater increase in A/ROM in RUE to improve ability to perform functional reaching tasks.    Time 8    Period Weeks    Status On-going      OT LONG TERM GOAL #3   Title Pt will demonstrate fine motor activation and control by completing 9 hole peg test or box and blocks test for coordination.    Time 8    Period Weeks    Status On-going                   Plan - 02/15/21 1230     Clinical Impression Statement A: Utilized NMES unit to faciliate and strengthening wrist flexors and extensors while pt attempting A/ROM. AA/ROM done after use of ES. VC were provided to complete movements slowly and with control versus quickly to maximize the ROM achieved. P/ROM completed for composit flexion and extension with reports of mild pain. Pt then focused on active shoulder height, and slightly above, reaching to touch sticky notes and for ring sliding activity. Supervision to min A for middline rod of ring sliding task.    Body Structure / Function / Physical Skills ADL;Endurance;UE functional use;ROM;Proprioception;IADL;Strength;Edema;Tone;Sensation;Coordination;FMC    Plan P: Continue NMES and shoulder level or above reaching tasks.    OT Home Exercise Plan eval: weightbearing; 1/3: shoulder and elbow AA/ROM 1/12: P/ROM fingers/thumb. Ball roll wrist flexion/extension    Consulted and Agree  with Plan of Care Patient             Patient will benefit  from skilled therapeutic intervention in order to improve the following deficits and impairments:   Body Structure / Function / Physical Skills: ADL, Endurance, UE functional use, ROM, Proprioception, IADL, Strength, Edema, Tone, Sensation, Coordination, [facility]       Visit Diagnosis: Other lack of coordination  Other symptoms and signs involving the nervous system    Problem List Patient Active Problem List   Diagnosis Date Noted   Slow transit constipation    Dyslipidemia    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

## 2021-02-19 ENCOUNTER — Other Ambulatory Visit (HOSPITAL_COMMUNITY): Payer: Self-pay

## 2021-02-19 ENCOUNTER — Ambulatory Visit (HOSPITAL_COMMUNITY): Payer: Medicare Other | Admitting: Speech Pathology

## 2021-02-20 ENCOUNTER — Telehealth (HOSPITAL_COMMUNITY): Payer: Self-pay | Admitting: Occupational Therapy

## 2021-02-20 ENCOUNTER — Ambulatory Visit (HOSPITAL_COMMUNITY): Payer: Medicare Other | Admitting: Occupational Therapy

## 2021-02-20 NOTE — Telephone Encounter (Signed)
Patient has Covid and will return Monday if she is better (tested 02/16/20

## 2021-02-21 ENCOUNTER — Encounter (HOSPITAL_COMMUNITY): Payer: Medicare Other | Admitting: Speech Pathology

## 2021-02-22 ENCOUNTER — Encounter (HOSPITAL_COMMUNITY): Payer: Medicare Other | Admitting: Occupational Therapy

## 2021-02-25 ENCOUNTER — Encounter (HOSPITAL_COMMUNITY): Payer: Self-pay

## 2021-02-25 ENCOUNTER — Other Ambulatory Visit: Payer: Self-pay

## 2021-02-25 ENCOUNTER — Ambulatory Visit (HOSPITAL_COMMUNITY): Payer: Medicare Other

## 2021-02-25 ENCOUNTER — Other Ambulatory Visit (HOSPITAL_COMMUNITY): Payer: Self-pay

## 2021-02-25 DIAGNOSIS — R278 Other lack of coordination: Secondary | ICD-10-CM

## 2021-02-25 DIAGNOSIS — R29818 Other symptoms and signs involving the nervous system: Secondary | ICD-10-CM

## 2021-02-25 DIAGNOSIS — R4701 Aphasia: Secondary | ICD-10-CM | POA: Diagnosis not present

## 2021-02-25 NOTE — Therapy (Signed)
Natasha Vazquez, [place], [address] Phone: [phone]   Fax:  [phone]  Occupational Therapy Treatment  Patient Details  Name: Natasha Vazquez MRN: [id number] Date of Birth: [birth year] Referring Provider (OT): Lauraine Rinne, PA-C   Encounter Date: 02/25/2021   OT End of Session - 02/25/21 1509     Visit Number 7    Number of Visits 24    Date for OT Re-Evaluation 03/25/21   mini-reassessment 02/21/2021   Authorization Type 1) UHC Medicare 2) Medicaid    Authorization Time Period no visit limit    Progress Note Due on Visit 10    OT Start Time 1300    OT Stop Time 1338    OT Time Calculation (min) 38 min    Activity Tolerance Patient tolerated treatment well    Behavior During Therapy [facility] for tasks assessed/performed             Past Medical History:  Diagnosis Date   Hypertension    TMJ (dislocation of temporomandibular joint)     Past Surgical History:  Procedure Laterality Date   ABDOMINAL HYSTERECTOMY     CHOLECYSTECTOMY     IR ANGIO INTRA EXTRACRAN SEL INTERNAL CAROTID BILAT MOD SED  09/16/2016   IR ANGIO VERTEBRAL SEL VERTEBRAL BILAT MOD SED  09/16/2016   MASS EXCISION Left 02/10/2020   Procedure: excision of soft tissue mass left upper back;  Surgeon: Jesusita Oka, MD;  Location: [facility] [facility];  Service: General;  Laterality: Left;    There were no vitals filed for this visit.   Subjective Assessment - 02/25/21 1421     Subjective  S: Pt is aphasic.    Patient is accompanied by: Family member   Granddaughter   Currently in Pain? Other (Comment)   Patient was able to communicate that she does experience wrist pain. not at the present time.               [facility] OT Assessment - 02/25/21 1422       Assessment   Medical Diagnosis s/p L Ichemic MCA CVA      Precautions   Precautions None                      OT Treatments/Exercises (OP) - 02/25/21 1422        Exercises   Exercises Shoulder      Neurological Re-education Exercises   Finger Flexion composite finger flexion 10X P/ROM    Finger Extension composite finger extension 10X P/ROM    Other Exercises 1 Completed AA/ROM wrist flexion and extension along with NMES unit. Once ES was completed, patient then completed AA/ROM with OT assisting for wrist flexion and extension 10X in gravity eliminated plane    Grasp and Release Thumb Opposition    Other Grasp and Release Exercises  standing at counter. with OT providing active assist for proper set up. patient complete grasp and release activity with sponges transferring from middle of table to either right or left focusing on thumb opposition.      Functional Reaching Activities   Low Level Complete low level reaching task standing at counter while completing shoulder flexion and slight elbow flexion to reach forward and retrieve sponges and bring to edge of table.      Modalities   Modalities Electrical engineer Stimulation Location right wrist extensors and flexors  Warden/ranger Parameters 35 mA CC extensors 40 mA CC flexors    Electrical Stimulation Goals Neuromuscular facilitation                    OT Education - 02/25/21 1508     Education Details Provided education to patient and family member regarding poroper positioning of right arm in sling.    Person(s) Educated Patient;Other (comment)   granddaughter   Methods Explanation;Demonstration    Comprehension Verbalized understanding              OT Short Term Goals - 01/30/21 1506       OT SHORT TERM GOAL #1   Title Pt will be provided with and educated on HEP to improve mobility of RUE as required for ADL completion.    Time 4    Period Weeks    Status On-going    Target Date 02/23/21      OT SHORT TERM GOAL #2   Title Pt will increase RUE strength to  4-/5 to improve ability to use RUE as assist when carrying items during ADLs.    Time 4    Period Weeks    Status On-going      OT SHORT TERM GOAL #3   Title Pt will demonstrate activation of wrist and digits required for a gross grasp on objects.    Time 4    Period Weeks    Status On-going      OT SHORT TERM GOAL #4   Title Pt will be educated on and demonstrate independence in NMR strategies to improve functional use of RUE.    Time 4    Period Weeks    Status On-going               OT Long Term Goals - 01/30/21 1506       OT LONG TERM GOAL #1   Title Pt will increase RUE strength to 4/5 or greater to improve ability to use RUE as active assist during ADLs.    Time 8    Period Weeks    Status On-going    Target Date 03/25/21      OT LONG TERM GOAL #2   Title Pt will demonstrate 25% or greater increase in A/ROM in RUE to improve ability to perform functional reaching tasks.    Time 8    Period Weeks    Status On-going      OT LONG TERM GOAL #3   Title Pt will demonstrate fine motor activation and control by completing 9 hole peg test or box and blocks test for coordination.    Time 8    Period Weeks    Status On-going                   Plan - 02/25/21 1509     Clinical Impression Statement A: Continued with NMES unit to faciliate and strengthen wrist flexors and extensors while patient completed AA/ROM.Helped to have patient hold a certain movement (ie. wrist extension) for approximately 5 seconds before transitioning to the next to decrease the speed of completion. Did well with low level functional reaching task. Did require assist to position hand/forearm correctly prior to attempting to pick up sponge. Even with limited ROM, patient was able to demonstrate movement needed.    Body Structure / Function / Physical Skills ADL;Endurance;UE functional use;ROM;Proprioception;IADL;Strength;Edema;Tone;Sensation;Coordination;FMC    Plan P: Continue with NMES  and functional low level reaching. Due to poor wrist positioning in sling (wrist is flexed at rest), would a simple resting wrist brace help to provide improved wrist support and decrease the amount of wrist flexion that occurs at rest?    Consulted and Agree with Plan of Care Patient;Family member/caregiver    Family Member Consulted Granddaughter             Patient will benefit from skilled therapeutic intervention in order to improve the following deficits and impairments:   Body Structure / Function / Physical Skills: ADL, Endurance, UE functional use, ROM, Proprioception, IADL, Strength, Edema, Tone, Sensation, Coordination, [facility]       Visit Diagnosis: Other lack of coordination  Other symptoms and signs involving the nervous system    Problem List Patient Active Problem List   Diagnosis Date Noted   Slow transit constipation    Dyslipidemia    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

## 2021-02-26 ENCOUNTER — Ambulatory Visit (HOSPITAL_COMMUNITY): Payer: Medicare Other | Admitting: Speech Pathology

## 2021-02-26 ENCOUNTER — Encounter (HOSPITAL_COMMUNITY): Payer: Self-pay | Admitting: Speech Pathology

## 2021-02-26 DIAGNOSIS — R41841 Cognitive communication deficit: Secondary | ICD-10-CM

## 2021-02-26 DIAGNOSIS — R278 Other lack of coordination: Secondary | ICD-10-CM | POA: Diagnosis not present

## 2021-02-26 DIAGNOSIS — R29818 Other symptoms and signs involving the nervous system: Secondary | ICD-10-CM | POA: Diagnosis not present

## 2021-02-26 DIAGNOSIS — R4701 Aphasia: Secondary | ICD-10-CM

## 2021-02-26 NOTE — Therapy (Signed)
[place], [place], [address] Phone: [phone]   Fax:  [phone]  Speech Language Pathology Treatment  Patient Details  Name: Natasha Vazquez MRN: [id number] Date of Birth: 29-Dec-1950 Referring Provider (SLP): Natasha Parsons, PA-C   Encounter Date: 02/26/2021   End of Session - 02/26/21 1730     Visit Number 6    Number of Visits 16    Date for SLP Re-Evaluation 03/14/21    Authorization Type UHC Medicare    Progress Note Due on Visit --   10th visit   SLP Start Time 1430    SLP Stop Time  1515    SLP Time Calculation (min) 45 min    Activity Tolerance Patient tolerated treatment well             Past Medical History:  Diagnosis Date   Hypertension    TMJ (dislocation of temporomandibular joint)     Past Surgical History:  Procedure Laterality Date   ABDOMINAL HYSTERECTOMY     CHOLECYSTECTOMY     IR ANGIO INTRA EXTRACRAN SEL INTERNAL CAROTID BILAT MOD SED  09/16/2016   IR ANGIO VERTEBRAL SEL VERTEBRAL BILAT MOD SED  09/16/2016   MASS EXCISION Left 02/10/2020   Procedure: excision of soft tissue mass left upper back;  Surgeon: Natasha Oka, MD;  Location: [facility];  Service: General;  Laterality: Left;    There were no vitals filed for this visit.   Subjective Assessment - 02/26/21 1456     Subjective "fish"    Patient is accompained by: Family member    Currently in Pain? No/denies                   ADULT SLP TREATMENT - 02/26/21 1730       General Information   Behavior/Cognition Alert;Cooperative;Pleasant mood    Patient Positioning Upright in chair    Oral care provided N/A    HPI Natasha Vazquez is a 71 yo female who was referred for speech/language evaluation and treatment by Natasha Rinne, PA-C after sustaining a left MCA CVA on 12/25/20 with resultant aphasia and right upper extremity weakness. She was discharged from [facility] inpatient rehab on  01/15/21 and her daughter, Natasha Vazquez moved in to help care for her.      Treatment Provided   Treatment provided Cognitive-Linquistic      Pain Assessment   Pain Assessment No/denies pain      Cognitive-Linquistic Treatment   Treatment focused on Aphasia;Patient/family/caregiver education    Skilled Treatment SLP provided multimodality communication strategies to elicit word productions (including melodic intonation, phrase completion, modeling, gestures, breaking perseverative responses, and written cues).      Assessment / Recommendations / Plan   Plan Continue with current plan of care      Progression Toward Goals   Progression toward goals Progressing toward goals                SLP Short Term Goals - 02/26/21 1731       SLP SHORT TERM GOAL #1   Title Patient will point to objects and object pictures/photos when named in field of 4 with 80% accuracy and cue for error awareness prn.    Baseline F=2, 90%, F=3, 80%, F=4, 70%    Time 4    Period Weeks    Status On-going    Target Date 02/28/21      SLP SHORT TERM GOAL #2  Title Pt will increase naming of common objects/pictures to 80% acc when provided with mod/max cues.    Baseline 0% independently, 66% with max cues    Time 4    Period Weeks    Status On-going    Target Date 02/28/21      SLP SHORT TERM GOAL #3   Title Pt will complete single word sentence completion tasks with set list of high frequency words with 80% acc when provided picture cue and mod prompts from SLP.    Baseline 70% max assist    Time 4    Period Weeks    Status On-going    Target Date 02/28/21      SLP SHORT TERM GOAL #4   Title Pt will provide a gesture for set list of 20 words with 90% effectiveness and mi/mod prompt via modeling from SLP.    Baseline 75% max assist    Time 4    Period Weeks    Status On-going    Target Date 02/28/21      SLP SHORT TERM GOAL #5   Title Pt will verbalize automatic and melodic tasks during faded  choral singing/talking activities with allowance for approximations and 80% acc with mod assist from SLP.    Baseline 50% max assist    Time 4    Period Weeks    Status On-going    Target Date 02/28/21              SLP Long Term Goals - 02/14/21 1543       SLP LONG TERM GOAL #1   Title Pt will communicate basic wants/needs to [facility] via multimodality communication strategies with mi/mod assist from caregivers.    Baseline Total assist    Time 3    Period Months    Status On-going      SLP LONG TERM GOAL #2   Title Pt will increase auditory comprehension for basic level information to [facility] with use of multimodality cues and min assist from caregivers.    Baseline mod assist    Time 3    Period Months    Status On-going              Plan - 02/26/21 1731     Clinical Impression Statement Pt was accompanied to therapy by her granddaughter today. She completed single word sentence completion tasks with 82% acc when provided min/mod phonemic cues and with allowance for approximations. She named 6 objects today with min cues. She completed auditory comprehension task of identifying object (f=4) with 83% acc with one cue and then completed auditory comprehension to match to written word with 79% acc. She spontaneously stated, "I need to go to the bathroom" at the end of the session today. She had COVID last week and reports feeling not yet back to normal. Continue to target goals next session.    Speech Therapy Frequency 2x / week    Duration 8 weeks    Treatment/Interventions SLP instruction and feedback;Compensatory strategies;Patient/family education;Cueing hierarchy;Multimodal communcation approach;Language facilitation;Compensatory techniques    Potential to Achieve Goals Good    Potential Considerations Severity of impairments    SLP Home Exercise Plan Pt will completed HEP as assigned to facilitate carryover of treatment strategies and techniques in home environment with use of  written cues as needed.    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

## 2021-02-27 ENCOUNTER — Encounter (HOSPITAL_COMMUNITY): Payer: Self-pay | Admitting: Occupational Therapy

## 2021-02-27 ENCOUNTER — Ambulatory Visit (HOSPITAL_COMMUNITY): Payer: Medicare Other | Admitting: Speech Pathology

## 2021-02-27 ENCOUNTER — Encounter (HOSPITAL_COMMUNITY): Payer: Self-pay | Admitting: Speech Pathology

## 2021-02-27 ENCOUNTER — Other Ambulatory Visit: Payer: Self-pay

## 2021-02-27 ENCOUNTER — Ambulatory Visit (HOSPITAL_COMMUNITY): Payer: Medicare Other | Admitting: Occupational Therapy

## 2021-02-27 DIAGNOSIS — R4701 Aphasia: Secondary | ICD-10-CM

## 2021-02-27 DIAGNOSIS — R278 Other lack of coordination: Secondary | ICD-10-CM

## 2021-02-27 DIAGNOSIS — R29818 Other symptoms and signs involving the nervous system: Secondary | ICD-10-CM

## 2021-02-27 DIAGNOSIS — R41841 Cognitive communication deficit: Secondary | ICD-10-CM

## 2021-02-27 NOTE — Patient Instructions (Signed)
AROM Exercises:  *Complete exercises ___10___ times each, ____3___ times per day*  1) Wrist Flexion  Start with wrist at edge of table, palm facing up. With wrist hanging slightly off table, curl wrist upward, and back down.      2) Wrist Extension  Start with wrist at edge of table, palm facing down. With wrist slightly off the edge of the table, curl wrist up and back down.      3) Radial Deviations  Start with forearm flat against a table, wrist hanging slightly off the edge, and palm facing the wall. Bending at the wrist only, and keeping palm facing the wall, bend wrist so fist is pointing towards the floor, back up to start position, and up towards the ceiling. Return to start.        4) WRIST PRONATION  Turn your forearm towards palm face down.  Keep your elbow bent and by the side of your  Body.      5) WRIST SUPINATION  Turn your forearm towards palm face up.  Keep your elbow bent and by the side of your  Body.        6) Digit composite flexion/adduction (make a fist) Hold your hand up as shown. Open and close your hand into a fist and repeat. If you cannot make a full fist, then make a partial fist.

## 2021-02-27 NOTE — Therapy (Addendum)
[facility] [address] [place], [place], [address] Phone: [phone]   Fax:  [phone]  Occupational Therapy Treatment ([place])  Patient Details  Name: Natasha Vazquez MRN: [id number] Date of Birth: 09-07-1950 Referring Provider (OT): Lauraine Rinne, PA-C   Progress Note Reporting Period 01/24/21 to 02/27/21  See note below for Objective Data and Assessment of Progress/Goals.      Encounter Date: 02/27/2021   OT End of Session - 02/27/21 1149     Visit Number 8    Number of Visits 24    Date for OT Re-Evaluation 03/25/21    Authorization Type 1) UHC Medicare 2) Medicaid    Authorization Time Period no visit limit    Progress Note Due on Visit 7    OT Start Time 0902    OT Stop Time 0945    OT Time Calculation (min) 43 min    Activity Tolerance Patient tolerated treatment well    Behavior During Therapy WFL for tasks assessed/performed             Past Medical History:  Diagnosis Date   Hypertension    TMJ (dislocation of temporomandibular joint)     Past Surgical History:  Procedure Laterality Date   ABDOMINAL HYSTERECTOMY     CHOLECYSTECTOMY     IR ANGIO INTRA EXTRACRAN SEL INTERNAL CAROTID BILAT MOD SED  09/16/2016   IR ANGIO VERTEBRAL SEL VERTEBRAL BILAT MOD SED  09/16/2016   MASS EXCISION Left 02/10/2020   Procedure: excision of soft tissue mass left upper back;  Surgeon: Jesusita Oka, MD;  Location: [facility];  Service: General;  Laterality: Left;    There were no vitals filed for this visit.   Subjective Assessment - 02/27/21 0901     Subjective  Pt's family reports they've been taking it easy since pt was sick last week.    Patient is accompanied by: Family member   granddaughter-[name]   Currently in Pain? No/denies                [facility] OT Assessment - 02/27/21 0900       Assessment   Medical Diagnosis s/p L Ichemic MCA CVA      Precautions   Precautions  None      AROM   AROM Assessment Site Shoulder;Forearm;Wrist    Right/Left Shoulder Right    Right Shoulder Flexion 110 Degrees   109 previous   Right Shoulder ABduction 90 Degrees   same as previous   Right Shoulder Internal Rotation 90 Degrees   same as previous   Right Shoulder External Rotation 18 Degrees   0 previous   Right/Left Forearm Right    Right Forearm Pronation 90 Degrees   not previously assessed   Right Forearm Supination 70 Degrees   not previously assessed   Right/Left Wrist Right    Right Wrist Extension 25 Degrees   not previously assessed   Right Wrist Flexion 50 Degrees   not previously assessed   Right Wrist Radial Deviation 20 Degrees   not previously assessed   Right Wrist Ulnar Deviation 20 Degrees   not previously assessed     Strength   Strength Assessment Site Shoulder;Elbow;Forearm;Wrist;Hand    Right/Left Shoulder Right    Right Shoulder Flexion --   same as previous   Right Shoulder ABduction 3-/5   same as previous   Right Shoulder Internal Rotation 3-/5   same as previous   Right Shoulder  External Rotation 3+/5   same as previous   Right/Left Elbow Right    Right Elbow Flexion 4/5   3/5 previous   Right Elbow Extension 4-/5   3/5 previous   Right/Left Forearm Right    Right Forearm Pronation 4+/5   3-/5 previous   Right Forearm Supination 4-/5   3-/5 previous   Right Wrist Flexion 4-/5   0/5 previous   Right Wrist Extension 3-/5   0/5 previous   Right Wrist Radial Deviation 3-/5   0/5 previous   Right Wrist Ulnar Deviation 3-/5   0/5 previous   Right/Left hand Right;Left    Right Hand Gross Grasp Impaired    Right Hand Grip (lbs) 0   not previously assessed   Right Hand Lateral Pinch 1.5 lbs   not previously assessed   Right Hand 3 Point Pinch 0 lbs   not previously assessed   Left Hand Gross Grasp Functional    Left Hand Grip (lbs) 85    Left Hand Lateral Pinch 15 lbs    Left Hand 3 Point Pinch 13 lbs                       OT Treatments/Exercises (OP) - 02/27/21 0001       Neurological Re-education Exercises   Shoulder Protraction AAROM;10 reps   holding small pink ball between hands   Wrist Flexion AROM;10 reps    Wrist Extension AROM;10 reps    Wrist Radial Deviation AROM;10 reps    Wrist Ulnar Deviation AROM;10 reps    Finger Flexion composite finger flexion AA/ROM-pt completing as far as possible then OT flexing the remainder of the way to make a fist. 10X    Finger Extension composite digit extension 10X, A/ROM    Other Grasp and Release Exercises  Standing at table, pt reaching to grab 5 cones and reach up to place on box on tabletop. OT stabilizing cone to grasp, then pt grasping and holding, moving to box independently. Completed the reverse to bring down from the box to the table. Occasional cuing for thumb mobility.    Development of Reach [employer] Ring Tree Pt standing at tabletop with Ring Tree on table. OT holding balls and pt reaching for ball, grasping between fingers and thumb, then reaching to place on red bar and slide across. Completed for 5 balls. Then switched to rings, OT assisting with set-up to pinch rings between fingers, then pt reaching to slide onto green bar. Max difficulty maintaining pinch/grasp on rings.                      OT Short Term Goals - 02/27/21 1149       OT SHORT TERM GOAL #1   Title Pt will be provided with and educated on HEP to improve mobility of RUE as required for ADL completion.    Time 4    Period Weeks    Status On-going    Target Date 02/23/21      OT SHORT TERM GOAL #2   Title Pt will increase RUE strength to 4-/5 to improve ability to use RUE as assist when carrying items during ADLs.    Time 4    Period Weeks    Status Partially Met      OT SHORT TERM GOAL #3   Title Pt will demonstrate activation of wrist and digits required for a gross grasp on objects.  Time 4    Period Weeks    Status Achieved      OT SHORT TERM GOAL  #4   Title Pt will be educated on and demonstrate independence in NMR strategies to improve functional use of RUE.    Time 4    Period Weeks    Status Achieved               OT Long Term Goals - 02/27/21 1150       OT LONG TERM GOAL #1   Title Pt will increase RUE strength to 4/5 or greater to improve ability to use RUE as active assist during ADLs.    Time 8    Period Weeks    Status On-going    Target Date 03/25/21      OT LONG TERM GOAL #2   Title Pt will demonstrate 25% or greater increase in A/ROM in RUE to improve ability to perform functional reaching tasks.    Time 8    Period Weeks    Status Achieved      OT LONG TERM GOAL #3   Title Pt will demonstrate fine motor activation and control by completing 9 hole peg test or box and blocks test for coordination.    Time 8    Period Weeks    Status On-going      OT LONG TERM GOAL #4   Title Pt will improve grip strength to 10# or greater and pinch strength to 5# or greater to improve ability to grasp and hold items during ADL completion.    Time 8    Period Weeks    Status New                   Plan - 02/27/21 1151     Clinical Impression Statement A: Mini-reassessment completed this session, pt has met 2/4 STGs and 1/4 LTGs with an additional STG partially met. A new goal for grip and pinch strength has been added. Pt is making great progress towards goals and improving functional use of her RUE during ADLs. Pt initially has 0/5 strength in the right wrist and digits, she was able to complete formal MMT against gravity today for wrist and has active digit mobility. Pt working hard in [place] today completing functional reaching while working on grasp, keeping left hand behind her back during activity to avoid trying to help with LUE. Provided updated HEP for active wrist and forearm movements and educated pt and granddaughter on trying to incorporate RUE into ADLs and functional tasks as much as  possible to promote muscle memory and motor planning recovery.    Body Structure / Function / Physical Skills ADL;Endurance;UE functional use;ROM;Proprioception;IADL;Strength;Edema;Tone;Sensation;Coordination;FMC    Plan P: Continue with RUE A/ROM, incorporating reaching and grasp. Follow up on HEP    OT Home Exercise Plan eval: weightbearing; 1/3: shoulder and elbow AA/ROM 1/12: P/ROM fingers/thumb. Ball roll wrist flexion/extension; 1/25: wrist and forearm A/ROM    Consulted and Agree with Plan of Care Patient;Family member/caregiver    Family Member Consulted Granddaughter             Patient will benefit from skilled therapeutic intervention in order to improve the following deficits and impairments:   Body Structure / Function / Physical Skills: ADL, Endurance, UE functional use, ROM, Proprioception, IADL, Strength, Edema, Tone, Sensation, Coordination, [facility]       Visit Diagnosis: Other lack of coordination  Other symptoms and signs involving the nervous  system    Problem List Patient Active Problem List   Diagnosis Date Noted   Slow transit constipation    Dyslipidemia    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 [place], [place], [address] Phone: [phone]   Fax:  [phone]  Name: Natasha Vazquez MRN: [id number] Date of Birth: [birth year]

## 2021-02-27 NOTE — Therapy (Signed)
Natasha Vazquez, [place], [address] Phone: [phone]   Fax:  [phone]  Speech Language Pathology Treatment  Patient Details  Name: Natasha Vazquez MRN: [id number] Date of Birth: Jun 23, 1950 Referring Provider (SLP): Natasha Parsons, PA-C   Encounter Date: 02/27/2021   End of Session - 02/27/21 1038     Visit Number 7    Number of Visits 16    Date for SLP Re-Evaluation 03/14/21    Authorization Type UHC Medicare    Progress Note Due on Visit --   10th visit   SLP Start Time [phone]    SLP Stop Time  1032    SLP Time Calculation (min) 45 min    Activity Tolerance Patient tolerated treatment well             Past Medical History:  Diagnosis Date   Hypertension    TMJ (dislocation of temporomandibular joint)     Past Surgical History:  Procedure Laterality Date   ABDOMINAL HYSTERECTOMY     CHOLECYSTECTOMY     IR ANGIO INTRA EXTRACRAN SEL INTERNAL CAROTID BILAT MOD SED  09/16/2016   IR ANGIO VERTEBRAL SEL VERTEBRAL BILAT MOD SED  09/16/2016   MASS EXCISION Left 02/10/2020   Procedure: excision of soft tissue mass left upper back;  Surgeon: Natasha Oka, MD;  Location: [facility];  Service: General;  Laterality: Left;    There were no vitals filed for this visit.   Subjective Assessment - 02/27/21 1034     Subjective "Telephone"    Patient is accompained by: Family member    Currently in Pain? No/denies               ADULT SLP TREATMENT - 02/27/21 1037       General Information   Behavior/Cognition Alert;Cooperative;Pleasant mood    Patient Positioning Upright in chair    Oral care provided N/A    HPI Natasha Vazquez is a 71 yo female who was referred for speech/language evaluation and treatment by Natasha Rinne, PA-C after sustaining a left MCA CVA on 12/25/20 with resultant aphasia and right upper extremity weakness. She was discharged from [facility] inpatient rehab on  01/15/21 and her daughter, Natasha Vazquez moved in to help care for her.      Treatment Provided   Treatment provided Cognitive-Linquistic      Pain Assessment   Pain Assessment No/denies pain      Cognitive-Linquistic Treatment   Treatment focused on Aphasia;Patient/family/caregiver education    Skilled Treatment SLP provided multimodality communication strategies to elicit word productions (including melodic intonation, phrase completion, modeling, gestures, breaking perseverative responses, and written cues) and target of auditory and reading comprehension.      Assessment / Recommendations / Plan   Plan Continue with current plan of care      Progression Toward Goals   Progression toward goals Progressing toward goals                SLP Short Term Goals - 02/27/21 1338       SLP SHORT TERM GOAL #1   Title Patient will point to objects and object pictures/photos when named in field of 4 with 80% accuracy and cue for error awareness prn.    Baseline F=2, 90%, F=3, 80%, F=4, 70%    Time 4    Period Weeks    Status On-going    Target Date 03/20/21      SLP SHORT  TERM GOAL #2   Title Pt will increase naming of common objects/pictures to 80% acc when provided with mod/max cues.    Baseline 0% independently, 66% with max cues    Time 4    Period Weeks    Status On-going    Target Date 03/20/21      SLP SHORT TERM GOAL #3   Title Pt will complete single word sentence completion tasks with set list of high frequency words with 80% acc when provided picture cue and mod prompts from SLP.    Baseline 70% max assist    Time 4    Period Weeks    Status On-going    Target Date 03/20/21      SLP SHORT TERM GOAL #4   Title Pt will provide a gesture for set list of 20 words with 90% effectiveness and mi/mod prompt via modeling from SLP.    Baseline 75% max assist    Time 4    Period Weeks    Status On-going    Target Date 03/20/21      SLP SHORT TERM GOAL #5   Title Pt will  verbalize automatic and melodic tasks during faded choral singing/talking activities with allowance for approximations and 80% acc with mod assist from SLP.    Baseline 50% max assist    Time 4    Period Weeks    Status On-going    Target Date 03/20/21              SLP Long Term Goals - 02/27/21 1338       SLP LONG TERM GOAL #1   Title Pt will communicate basic wants/needs to [facility] via multimodality communication strategies with mi/mod assist from caregivers.    Baseline Total assist    Time 3    Period Months    Status On-going      SLP LONG TERM GOAL #2   Title Pt will increase auditory comprehension for basic level information to  [facility] with use of multimodality cues and min assist from caregivers.    Baseline mod assist    Time 3    Period Months    Status On-going              Plan - 02/27/21 1311     Clinical Impression Statement Pt was accompanied by her granddaughter, Natasha Vazquez, for therapy today. Pt expressed feeling tired and gestured her head on her shoulder with eyes closed. She was seen after OT today. She named 10 pictured objects today, which is a drastic improvement. She completed single word sentence completion task with 86% acc when provided moderate cues (silent phonetic placement). She completed reading comprehension of F=3 pictured objects to written word with 88% acc with min cues. Her performance drastically deteriorated when advanced to f=4. Pt, caregiver, and SLP developed list of functional objects used at home (TV, candy, rain, truck, mail, plants, coffee, glove, earrings, necklace, cross, pants, shirt, gown, medicine, bathroom, [employer], and plate). SLP will create pic art to make more flash cards. Pt continues to make good progress toward all goals. Next session, target confrontation naming again and automatic sequences with choral cues.    Speech Therapy Frequency 2x / week    Duration 8 weeks    Treatment/Interventions SLP instruction and  feedback;Compensatory strategies;Patient/family education;Cueing hierarchy;Multimodal communcation approach;Language facilitation;Compensatory techniques    Potential to Achieve Goals Good    Potential Considerations Severity of impairments    SLP Home Exercise Plan Pt will  completed HEP as assigned to facilitate carryover of treatment strategies and techniques in home environment with use of written cues as needed.    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 [place], [place], [address] Phone: [phone]   Fax:  [phone]   Name: URSULA DERMODY MRN: [id number] Date of Birth: Oct 09, 1950

## 2021-02-28 ENCOUNTER — Encounter (HOSPITAL_COMMUNITY): Payer: Medicare Other | Admitting: Speech Pathology

## 2021-03-01 ENCOUNTER — Ambulatory Visit (HOSPITAL_COMMUNITY): Payer: Medicare Other

## 2021-03-01 ENCOUNTER — Other Ambulatory Visit: Payer: Self-pay

## 2021-03-01 ENCOUNTER — Encounter (HOSPITAL_COMMUNITY): Payer: Self-pay

## 2021-03-01 DIAGNOSIS — R278 Other lack of coordination: Secondary | ICD-10-CM

## 2021-03-01 DIAGNOSIS — R29818 Other symptoms and signs involving the nervous system: Secondary | ICD-10-CM

## 2021-03-01 DIAGNOSIS — R4701 Aphasia: Secondary | ICD-10-CM | POA: Diagnosis not present

## 2021-03-01 NOTE — Therapy (Signed)
[facility] [place], [place], [address] Phone: [phone]   Fax:  [phone]  Occupational Therapy Treatment  Patient Details  Name: Natasha Vazquez MRN: [id number] Date of Birth: [birth year] Referring Provider (OT): Lauraine Rinne, PA-C   Encounter Date: 03/01/2021   OT End of Session - 03/01/21 2109     Visit Number 9    Number of Visits 24    Date for OT Re-Evaluation 03/25/21    Authorization Type 1) UHC Medicare 2) Medicaid    Authorization Time Period no visit limit    Progress Note Due on Visit 10    OT Start Time 1030    OT Stop Time 1108    OT Time Calculation (min) 38 min    Activity Tolerance Patient tolerated treatment well    Behavior During Therapy [facility] for tasks assessed/performed             Past Medical History:  Diagnosis Date   Hypertension    TMJ (dislocation of temporomandibular joint)     Past Surgical History:  Procedure Laterality Date   ABDOMINAL HYSTERECTOMY     CHOLECYSTECTOMY     IR ANGIO INTRA EXTRACRAN SEL INTERNAL CAROTID BILAT MOD SED  09/16/2016   IR ANGIO VERTEBRAL SEL VERTEBRAL BILAT MOD SED  09/16/2016   MASS EXCISION Left 02/10/2020   Procedure: excision of soft tissue mass left upper back;  Surgeon: Jesusita Oka, MD;  Location: [facility];  Service: General;  Laterality: Left;    There were no vitals filed for this visit.   Subjective Assessment - 03/01/21 1234     Subjective  S: Pt is aphasic. Granddaughter reports that she hasn't complained of any pain.    Patient is accompanied by: Family member   granddaughter Natasha Vazquez   Currently in Pain? No/denies                [facility] OT Assessment - 03/01/21 1235       Assessment   Medical Diagnosis s/p L Ichemic MCA CVA      Precautions   Precautions None                      OT Treatments/Exercises (OP) - 03/01/21 1235       Exercises   Exercises Shoulder      Neurological  Re-education Exercises   Other Exercises 1 Powder board used. COmpleted shoulder flexion, protraction and elbow flexion/extension in gravity eliminated plane (sidelying) 10X    Other Exercises 2 Seated, gravity eliminated plane, wrist flexin/extension, supination/pronation, finger flexion/extension composite, 10X    Other Grasp and Release Exercises  Standing at elevated mat table, complete functional grasp and release using 8 cones. Provided minimal support and guidance for form and technique then no assist was provided.    Other Grasp and Release Exercises  Seated grasp and release task completed utilizing sponges while focusing on functional lateral pinch.                      OT Short Term Goals - 03/01/21 2123       OT SHORT TERM GOAL #1   Title Pt will be provided with and educated on HEP to improve mobility of RUE as required for ADL completion.    Time 4    Period Weeks    Status On-going    Target Date 02/23/21      OT SHORT TERM  GOAL #2   Title Pt will increase RUE strength to 4-/5 to improve ability to use RUE as assist when carrying items during ADLs.    Time 4    Period Weeks    Status Partially Met      OT SHORT TERM GOAL #3   Title Pt will demonstrate activation of wrist and digits required for a gross grasp on objects.    Time 4    Period Weeks      OT SHORT TERM GOAL #4   Title Pt will be educated on and demonstrate independence in NMR strategies to improve functional use of RUE.    Time 4    Period Weeks               OT Long Term Goals - 03/01/21 2124       OT LONG TERM GOAL #1   Title Pt will increase RUE strength to 4/5 or greater to improve ability to use RUE as active assist during ADLs.    Time 8    Period Weeks    Status On-going    Target Date 03/25/21      OT LONG TERM GOAL #2   Title Pt will demonstrate 25% or greater increase in A/ROM in RUE to improve ability to perform functional reaching tasks.    Time 8    Period Weeks       OT LONG TERM GOAL #3   Title Pt will demonstrate fine motor activation and control by completing 9 hole peg test or box and blocks test for coordination.    Time 8    Period Weeks    Status On-going      OT LONG TERM GOAL #4   Title Pt will improve grip strength to 10# or greater and pinch strength to 5# or greater to improve ability to grasp and hold items during ADL completion.    Time 8    Period Weeks    Status On-going                   Plan - 03/01/21 2110     Clinical Impression Statement A: Focused session on A/ROM of the RUE in a gravity eliminated plane to faciliate functional movement patterns with less compensatory movement patterns. Grasp and release tasks completed both seated and standing. Due to decreased finger and thumb extension provided strategies to compensate for lack of movement.    Body Structure / Function / Physical Skills ADL;Endurance;UE functional use;ROM;Proprioception;IADL;Strength;Edema;Tone;Sensation;Coordination;FMC    Plan P: Continue with RUE A/ROM, incorporating reaching and grasp.    Consulted and Agree with Plan of Care Patient;Family member/caregiver    Family Member Consulted Granddaughter             Patient will benefit from skilled therapeutic intervention in order to improve the following deficits and impairments:   Body Structure / Function / Physical Skills: ADL, Endurance, UE functional use, ROM, Proprioception, IADL, Strength, Edema, Tone, Sensation, Coordination, [facility]       Visit Diagnosis: Other lack of coordination  Other symptoms and signs involving the nervous system    Problem List Patient Active Problem List   Diagnosis Date Noted   Slow transit constipation    Dyslipidemia    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

## 2021-03-05 ENCOUNTER — Ambulatory Visit (HOSPITAL_COMMUNITY): Payer: Medicare Other | Admitting: Speech Pathology

## 2021-03-05 ENCOUNTER — Ambulatory Visit (HOSPITAL_COMMUNITY): Payer: Medicare Other

## 2021-03-07 ENCOUNTER — Encounter (HOSPITAL_COMMUNITY): Payer: Self-pay

## 2021-03-07 ENCOUNTER — Other Ambulatory Visit: Payer: Self-pay

## 2021-03-07 ENCOUNTER — Ambulatory Visit (HOSPITAL_COMMUNITY): Payer: Medicare Other | Attending: Physician Assistant

## 2021-03-07 ENCOUNTER — Encounter (HOSPITAL_COMMUNITY): Payer: Self-pay | Admitting: Speech Pathology

## 2021-03-07 ENCOUNTER — Ambulatory Visit (HOSPITAL_COMMUNITY): Payer: Medicare Other | Admitting: Speech Pathology

## 2021-03-07 DIAGNOSIS — R41841 Cognitive communication deficit: Secondary | ICD-10-CM | POA: Insufficient documentation

## 2021-03-07 DIAGNOSIS — R29818 Other symptoms and signs involving the nervous system: Secondary | ICD-10-CM | POA: Insufficient documentation

## 2021-03-07 DIAGNOSIS — R4701 Aphasia: Secondary | ICD-10-CM

## 2021-03-07 DIAGNOSIS — R278 Other lack of coordination: Secondary | ICD-10-CM | POA: Insufficient documentation

## 2021-03-07 NOTE — Therapy (Signed)
[place], [place], [address] Phone: [phone]   Fax:  [phone]  Speech Language Pathology Treatment  Patient Details  Name: Natasha Vazquez MRN: [id number] Date of Birth: July 26, 1950 Referring Provider (SLP): Cathlyn Parsons, PA-C   Encounter Date: 03/07/2021   End of Session - 03/07/21 1125     Visit Number 8    Number of Visits 16    Date for SLP Re-Evaluation 03/21/21    Authorization Type UHC Medicare    Progress Note Due on Visit --   10th visit   SLP Start Time 1030    SLP Stop Time  1115    SLP Time Calculation (min) 45 min    Activity Tolerance Patient tolerated treatment well             Past Medical History:  Diagnosis Date   Hypertension    TMJ (dislocation of temporomandibular joint)     Past Surgical History:  Procedure Laterality Date   ABDOMINAL HYSTERECTOMY     CHOLECYSTECTOMY     IR ANGIO INTRA EXTRACRAN SEL INTERNAL CAROTID BILAT MOD SED  09/16/2016   IR ANGIO VERTEBRAL SEL VERTEBRAL BILAT MOD SED  09/16/2016   MASS EXCISION Left 02/10/2020   Procedure: excision of soft tissue mass left upper back;  Surgeon: Jesusita Oka, MD;  Location: [facility];  Service: General;  Laterality: Left;    There were no vitals filed for this visit.   Subjective Assessment - 03/07/21 1105     Subjective "Cranberry sauce."    Currently in Pain? 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 She was discharged from [facility] inpatient rehab on 01/15/21 and her daughter, Blanch Media moved in  to help care for her.      Treatment Provided   Treatment provided Cognitive-Linquistic      Pain Assessment   Pain Assessment No/denies pain      Cognitive-Linquistic Treatment   Treatment focused on Aphasia;Patient/family/caregiver education    Skilled Treatment SLP provided multimodality communication strategies to elicit word productions (including melodic intonation, phrase completion, modeling, gestures, breaking perseverative responses, and written cues) and target of auditory and reading comprehension.      Assessment / Recommendations / Plan   Plan Continue with current plan of care      Progression Toward Goals   Progression toward goals Progressing toward goals              SLP Education - 03/07/21 1124     Education Details Sent home new set of pictures to target    Person(s) Educated Patient;Caregiver(s)    Methods Explanation;Handout    Comprehension Verbalized understanding              SLP Short Term Goals - 03/07/21 1126       SLP SHORT TERM GOAL #1   Title Patient will point to objects and object pictures/photos when named in field of 4 with 80% accuracy and cue for error awareness  prn.    Baseline F=2, 90%, F=3, 80%, F=4, 70%    Time 4    Period Weeks    Status On-going    Target Date 03/21/21      SLP SHORT TERM GOAL #2   Title Pt will increase naming of common objects/pictures to 80% acc when provided with mod/max cues.    Baseline 0% independently, 66% with max cues    Time 4    Period Weeks    Status On-going    Target Date 03/21/21      SLP SHORT TERM GOAL #3   Title Pt will complete single word sentence completion tasks with set list of high frequency words with 80% acc when provided picture cue and mod prompts from SLP.    Baseline 70% max assist    Time 4    Period Weeks    Status On-going    Target Date 03/21/21      SLP SHORT TERM GOAL #4   Title Pt will provide a gesture for set list of 20 words with 90% effectiveness and  mi/mod prompt via modeling from SLP.    Baseline 75% max assist    Time 4    Period Weeks    Status On-going    Target Date 03/21/21      SLP SHORT TERM GOAL #5   Title Pt will verbalize automatic and melodic tasks during faded choral singing/talking activities with allowance for approximations and 80% acc with mod assist from SLP.    Baseline 50% max assist    Time 4    Period Weeks    Status On-going    Target Date 03/21/21              SLP Long Term Goals - 03/07/21 1126       SLP LONG TERM GOAL #1   Title Pt will communicate basic wants/needs to [facility] via multimodality communication strategies with mi/mod assist from caregivers.    Baseline Total assist    Time 3    Period Months    Status On-going      SLP LONG TERM GOAL #2   Title Pt will increase auditory comprehension for basic level information to [facility] with use of multimodality cues and min assist from caregivers.    Baseline mod assist    Time 3    Period Months    Status On-going              Plan - 03/07/21 1125     Clinical Impression Statement Pt was accompanied by her granddaughter, Merleen Nicely, for therapy today. SLP provided a novel set of functional vocabulary including: TV, candy, rain, truck, mail, plants, coffee, glove, earrings, necklace, cross, pants, shirt, gown, medicine, bathroom, [employer], and plate. Pt was able to complete single word sentence completion tasks with the new set of words with 75% acc with mod cues provided. She spontaneously labled one of the words. She was given additional picture vocabulary words to practice at home. During confrontation naming tasks, she produced the initial phoneme or gross approximation of the word 40% of the time. Pt continues to make good progress toward all goals. Next session, target confrontation naming again and automatic sequences with choral cues.    Speech Therapy Frequency 2x / week    Duration 8 weeks    Treatment/Interventions SLP instruction and  feedback;Compensatory strategies;Patient/family education;Cueing hierarchy;Multimodal communcation approach;Language facilitation;Compensatory techniques    Potential to Achieve Goals Good    Potential  Considerations Severity of impairments    SLP Home Exercise Plan Pt will completed HEP as assigned to facilitate carryover of treatment strategies and techniques in home environment with use of written cues as needed.    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 [place], [place], [address] Phone: [phone]   Fax:  [phone]   Name: Natasha Vazquez MRN: [id number] Date of Birth: February 15, 1950

## 2021-03-07 NOTE — Therapy (Signed)
[place], [place], [address] Phone: [phone]   Fax:  [phone]  Occupational Therapy Treatment  Patient Details  Name: Natasha Vazquez MRN: [id number] Date of Birth: [birth year] Referring Provider (OT): Lauraine Rinne, PA-C   Encounter Date: 03/07/2021   OT End of Session - 03/07/21 1535     Visit Number 10    Number of Visits 24    Date for OT Re-Evaluation 03/25/21    Authorization Type 1) UHC Medicare 2) Medicaid    Authorization Time Period no visit limit    Progress Note Due on Visit 20    OT Start Time 1430    OT Stop Time 1508    OT Time Calculation (min) 38 min    Activity Tolerance Patient tolerated treatment well    Behavior During Therapy [facility] for tasks assessed/performed             Past Medical History:  Diagnosis Date   Hypertension    TMJ (dislocation of temporomandibular joint)     Past Surgical History:  Procedure Laterality Date   ABDOMINAL HYSTERECTOMY     CHOLECYSTECTOMY     IR ANGIO INTRA EXTRACRAN SEL INTERNAL CAROTID BILAT MOD SED  09/16/2016   IR ANGIO VERTEBRAL SEL VERTEBRAL BILAT MOD SED  09/16/2016   MASS EXCISION Left 02/10/2020   Procedure: excision of soft tissue mass left upper back;  Surgeon: Jesusita Oka, MD;  Location: [place];  Service: General;  Laterality: Left;    There were no vitals filed for this visit.   Subjective Assessment - 03/07/21 1517     Subjective  S: Granddaughter reports that Patient slammed her right hand into the car door when she closed it on Monday.    Patient is accompanied by: Family member   granddaughter Natasha Vazquez   Currently in Pain? Other (Comment)   No number provided. Able to communicate that her right hand/fingers hurt when trying to move them since Monday.               [facility] OT Assessment - 03/07/21 1518       Assessment   Medical Diagnosis s/p L Ischemic MCA CVA      Precautions   Precautions  None                      OT Treatments/Exercises (OP) - 03/07/21 1518       Exercises   Exercises Shoulder      Neurological Re-education Exercises   Other Exercises 1 Powder board used sidelying for gravity eliminated plane; 10X; shoulder flexion/extension, protraction/retraction    Other Exercises 2 Seated, gravity eliminated wrist flexion/extension 10X A/ROM. Against gravity, seated elbow flexion/extension A/ROM 10X    Other Grasp and Release Exercises  Seated, small light up balls used to focus on light gross grasp of right hand. Attempting to actively supinate right forearm to place ball, then moved ball to left side and placing it in Granddaughter's hand while pronating forearm.    Development of Reach Saebo    Saebo Crate; Right Both hands used to transfer Saebo ball from OT's on the right to the left onto tray table. Pt then transfered balls back from tray table to OT moving them from left to right.                      OT Short Term Goals - 03/01/21 2123  OT SHORT TERM GOAL #1   Title Pt will be provided with and educated on HEP to improve mobility of RUE as required for ADL completion.    Time 4    Period Weeks    Status On-going    Target Date 02/23/21      OT SHORT TERM GOAL #2   Title Pt will increase RUE strength to 4-/5 to improve ability to use RUE as assist when carrying items during ADLs.    Time 4    Period Weeks    Status Partially Met      OT SHORT TERM GOAL #3   Title Pt will demonstrate activation of wrist and digits required for a gross grasp on objects.    Time 4    Period Weeks      OT SHORT TERM GOAL #4   Title Pt will be educated on and demonstrate independence in NMR strategies to improve functional use of RUE.    Time 4    Period Weeks               OT Long Term Goals - 03/01/21 2124       OT LONG TERM GOAL #1   Title Pt will increase RUE strength to 4/5 or greater to improve ability to use RUE as  active assist during ADLs.    Time 8    Period Weeks    Status On-going    Target Date 03/25/21      OT LONG TERM GOAL #2   Title Pt will demonstrate 25% or greater increase in A/ROM in RUE to improve ability to perform functional reaching tasks.    Time 8    Period Weeks      OT LONG TERM GOAL #3   Title Pt will demonstrate fine motor activation and control by completing 9 hole peg test or box and blocks test for coordination.    Time 8    Period Weeks    Status On-going      OT LONG TERM GOAL #4   Title Pt will improve grip strength to 10# or greater and pinch strength to 5# or greater to improve ability to grasp and hold items during ADL completion.    Time 8    Period Weeks    Status On-going                   Plan - 03/07/21 1535     Clinical Impression Statement A: 10th visit progress note: (From mini reassessment on 02/27/21) - Pt has met 2/4 STGs and 1/4 LTGs with an additional STG partially met. A new goal for grip and pinch strength has been added. Pt is making great progress towards goals and improving functional use of her RUE during ADLs. Pt initially has 0/5 strength in the right wrist and digits, she was able to complete formal MMT against gravity today for wrist and has active digit mobility. Pt working hard in [place] today completing functional reaching while working on grasp, keeping left hand behind her back during activity to avoid trying to help with LUE. Today, Patient's granddaughter reports that Natasha Vazquez closed her right hand in the car door on Monday which has resulted in bruising and blood blisters at her right PIP and DIP joints. Pt voices pain with attempting to move her fingers so session focused more on shoulder use while completing reaching tasks.    Body Structure / Function / Physical Skills ADL;Endurance;UE functional use;ROM;Proprioception;IADL;Strength;Edema;Tone;Sensation;Coordination;FMC  Plan P: Continue with RUE A/ROM, incorporating  reaching and grasp. Follow up on MD's appointment.    Consulted and Agree with Plan of Care Patient;Family member/caregiver    Family Member Consulted Granddaughter             Patient will benefit from skilled therapeutic intervention in order to improve the following deficits and impairments:   Body Structure / Function / Physical Skills: ADL, Endurance, UE functional use, ROM, Proprioception, IADL, Strength, Edema, Tone, Sensation, Coordination, [facility]       Visit Diagnosis: Other lack of coordination  Other symptoms and signs involving the nervous system    Problem List Patient Active Problem List   Diagnosis Date Noted   Slow transit constipation    Dyslipidemia    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

## 2021-03-08 ENCOUNTER — Encounter: Payer: Self-pay | Admitting: Physical Medicine & Rehabilitation

## 2021-03-08 ENCOUNTER — Encounter: Payer: Medicare Other | Attending: Registered Nurse | Admitting: Physical Medicine & Rehabilitation

## 2021-03-08 VITALS — BP 168/82 | HR 78 | Temp 97.8°F | Ht 68.0 in | Wt 152.4 lb

## 2021-03-08 DIAGNOSIS — I63512 Cerebral infarction due to unspecified occlusion or stenosis of left middle cerebral artery: Secondary | ICD-10-CM | POA: Diagnosis not present

## 2021-03-08 NOTE — Progress Notes (Signed)
Subjective:    Patient ID: Natasha Vazquez, female    DOB: 11/05/1950, 71 y.o.   MRN: [id number] 71 y.o. right-handed female with history of hypertension GI bleed as well as hyperlipidemia.  Per chart review lives alone independent household ambulator.  She does not drive.  Presented to [facility]25/2020 with right side weakness aphasia and altered mental status.  Blood pressure was elevated 178/73.  Admission chemistries unremarkable save alcohol negative glucose 147.  CT angiogram head and neck hypodensity in the posterior left frontal lobe concerning for acute/subacute infarction.  No intracranial large vessel occlusion.  Multifocal narrowing of the right A1 and bilateral distal MCA branches.  No hemodynamically significant stenosis in the neck.  MRI of the brain extensive acute infarcts throughout the left cerebrum including left frontal parietal and temporal lobes and left basal ganglia.  There was mild petechial hemorrhage.  Patient did not receive tPA.  Echocardiogram with ejection fraction of 65 to 70% no regional wall motion abnormalities grade 1 diastolic dysfunction.  Neurology follow-up maintained on aspirin 325 mg daily and Plavix 75 mg daily for CVA prophylaxis x3 months then aspirin alone.  Therapy evaluations completed due to patient's right side weakness and dysarthric speech was admitted for a comprehensive rehab program.   Admit date: 01/02/2021 Discharge date: 01/15/2021  HPI Patient is back today after starting therapy as an outpatient.  She is living with her daughter.  Her granddaughter is with her today. She is walking well and no longer receives outpatient PT but has no hand function on the right side and has severe aphasia.  According to the granddaughter, patient gets very frustrated with each therapy and does not feel like the patient is making much progress.  I reviewed speech therapy note and short-term goals have not been achieved. Continues to work with OT.   No useful hand function noted thus far.  Still needs help with cooking but other wise independent  Pain Inventory Average Pain 0 Pain Right Now 0 My pain is intermittent and sharp  LOCATION OF PAIN  hand , fingers  BOWEL Number of stools per week: 4 Oral laxative use No  Type of laxative n/a Enema or suppository use No  History of colostomy No  Incontinent No   BLADDER Normal Bladder incontinence No  Frequent urination No  Leakage with coughing No  Difficulty starting stream No  Incomplete bladder emptying No    Mobility walk without assistance how many minutes can you walk? All day ability to climb steps?  yes do you drive?  no  Function retired I need assistance with the following:  meal prep and household duties  Neuro/Psych No problems in this area  Prior Studies Any changes since last visit?  no  Physicians involved in your care Any changes since last visit?  no   No family history on file. Social History   Socioeconomic History   Marital status: Legally Separated    Spouse name: Not on file   Number of children: Not on file   Years of education: Not on file   Highest education level: Not on file  Occupational History   Not on file  Tobacco Use   Smoking status: Never   Smokeless tobacco: Never  Vaping Use   Vaping Use: Never used  Substance and Sexual Activity   Alcohol use: No   Drug use: No   Sexual activity: Not on file  Other Topics Concern   Not on file  Social History Narrative   Not on file   Social Determinants of Health   Financial Resource Strain: Not on file  Food Insecurity: Not on file  Transportation Needs: Not on file  Physical Activity: Not on file  Stress: Not on file  Social Connections: Not on file   Past Surgical History:  Procedure Laterality Date   ABDOMINAL HYSTERECTOMY     CHOLECYSTECTOMY     IR ANGIO INTRA EXTRACRAN SEL INTERNAL CAROTID BILAT MOD SED  09/16/2016   IR ANGIO VERTEBRAL SEL VERTEBRAL BILAT  MOD SED  09/16/2016   MASS EXCISION Left 02/10/2020   Procedure: excision of soft tissue mass left upper back;  Surgeon: Jesusita Oka, MD;  Location: [place];  Service: General;  Laterality: Left;   Past Medical History:  Diagnosis Date   Hypertension    TMJ (dislocation of temporomandibular joint)    BP (!) 167/82    Pulse 78    Temp 97.8 F (36.6 C) (Oral)    Ht 5\' 8"  (1.727 m)    Wt 152 lb 6.4 oz (69.1 kg)    SpO2 98%    BMI 23.17 kg/m   Opioid Risk Score:   Fall Risk Score:  `1  Depression screen PHQ 2/9  Depression screen [facility] 2/9 03/08/2021 01/29/2021  Decreased Interest 0 0  Down, Depressed, Hopeless 0 0  PHQ - 2 Score 0 0  Altered sleeping - 0  Tired, decreased energy - 0  Change in appetite - 0  Feeling bad or failure about yourself  - 0  Trouble concentrating - 0  Moving slowly or fidgety/restless - 0  Suicidal thoughts - 0  PHQ-9 Score - 0       Review of Systems  Constitutional:  Positive for unexpected weight change.       Night sweats  HENT: Negative.    Eyes: Negative.   Respiratory: Negative.    Cardiovascular: Negative.        Swelling in hand  Gastrointestinal: Negative.   Endocrine: Negative.   Genitourinary: Negative.   Musculoskeletal: Negative.   Skin: Negative.   Allergic/Immunologic: Negative.   Neurological: Negative.   Hematological: Negative.   Psychiatric/Behavioral: Negative.        Objective:   Physical Exam Vitals reviewed.  Constitutional:      Appearance: Normal appearance.  HENT:     Head: Normocephalic and atraumatic.  Eyes:     Extraocular Movements: Extraocular movements intact.     Conjunctiva/sclera: Conjunctivae normal.     Pupils: Pupils are equal, round, and reactive to light.  Neurological:     Mental Status: She is alert.     Comments: Patient has severe expressive aphasia.  The patient is able to follow simple commands Motor strength is 0/5 in the finger flexors trace finger extensors, 3 -  elbow flexion extension Right lower extremity is 5/5 hip flexor knee extensor ankle dorsiflexor Left upper and left lower limb are 5/5 Sensation is reduced to pinch right upper extremity. Ambulates without assistive device she can do side to side toe walking as well as heel walking.  Psychiatric:        Mood and Affect: Mood normal.        Behavior: Behavior normal.          Assessment & Plan:  #1.  Left MCA distribution infarct with right upper extremity weakness as well as expressive aphasia.  Continue outpatient OT, the patient may benefit from a break in  her speech therapy to allow for some natural recovery and perhaps a retrial in a couple months.  I will see the patient back in about 2 months. She may be a [facility] candidate although at this point hand function does not appear to be adequate or sufficient for referral. Consider transport to study at [place] have given information to family and patient.

## 2021-03-08 NOTE — Patient Instructions (Signed)
May take a break from speech therapy and resume when additional natural recovery has occurred to maximize benefit

## 2021-03-12 ENCOUNTER — Other Ambulatory Visit: Payer: Self-pay

## 2021-03-12 ENCOUNTER — Encounter (HOSPITAL_COMMUNITY): Payer: Self-pay

## 2021-03-12 ENCOUNTER — Ambulatory Visit (HOSPITAL_COMMUNITY): Payer: Medicare Other

## 2021-03-12 DIAGNOSIS — R278 Other lack of coordination: Secondary | ICD-10-CM

## 2021-03-12 DIAGNOSIS — R4701 Aphasia: Secondary | ICD-10-CM | POA: Diagnosis not present

## 2021-03-12 DIAGNOSIS — R29818 Other symptoms and signs involving the nervous system: Secondary | ICD-10-CM | POA: Diagnosis not present

## 2021-03-12 NOTE — Therapy (Signed)
[place], [place], [address] Phone: [phone]   Fax:  [phone]  Occupational Therapy Treatment  Patient Details  Name: Natasha Vazquez MRN: [id number] Date of Birth: [birth year] Referring Provider (OT): Lauraine Rinne, PA-C   Encounter Date: 03/12/2021   OT End of Session - 03/12/21 1244     Visit Number 11    Number of Visits 24    Date for OT Re-Evaluation 03/25/21    Authorization Type 1) UHC Medicare 2) Medicaid    Authorization Time Period no visit limit    Progress Note Due on Visit 20    OT Start Time 1115    OT Stop Time 1153    OT Time Calculation (min) 38 min    Activity Tolerance Patient tolerated treatment well    Behavior During Therapy [facility] for tasks assessed/performed             Past Medical History:  Diagnosis Date   Hypertension    TMJ (dislocation of temporomandibular joint)     Past Surgical History:  Procedure Laterality Date   ABDOMINAL HYSTERECTOMY     CHOLECYSTECTOMY     IR ANGIO INTRA EXTRACRAN SEL INTERNAL CAROTID BILAT MOD SED  09/16/2016   IR ANGIO VERTEBRAL SEL VERTEBRAL BILAT MOD SED  09/16/2016   MASS EXCISION Left 02/10/2020   Procedure: excision of soft tissue mass left upper back;  Surgeon: Jesusita Oka, MD;  Location: [facility];  Service: General;  Laterality: Left;    There were no vitals filed for this visit.   Subjective Assessment - 03/12/21 1210     Subjective  S: MD appointment went well. Nothing new to report.    Patient is accompanied by: Family member   Granddaughter Natasha Vazquez   Currently in Pain? No/denies                [facility] OT Assessment - 03/12/21 1211       Assessment   Medical Diagnosis s/p L Ischemic MCA CVA      Precautions   Precautions None                      OT Treatments/Exercises (OP) - 03/12/21 1211       Exercises   Exercises Shoulder;Hand      Neurological Re-education Exercises    Finger Flexion P/ROM Finger flexion with prolonged stretch;      Functional Reaching Activities   Mid Level Using bilateral hands to hold onto small pink ball. Completed shoulder flexion, chest press, 10X each. Started at just below chest level using tray table as start.      Modalities   Modalities Teacher, English as a foreign language Location right hand finger flexors    Electrical Stimulation Action Engineer, petroleum Parameters 38 mA CC 5/5 cycle    Electrical Stimulation Goals Neuromuscular facilitation                      OT Short Term Goals - 03/01/21 2123       OT SHORT TERM GOAL #1   Title Pt will be provided with and educated on HEP to improve mobility of RUE as required for ADL completion.    Time 4    Period Weeks    Status On-going    Target Date 02/23/21      OT SHORT  TERM GOAL #2   Title Pt will increase RUE strength to 4-/5 to improve ability to use RUE as assist when carrying items during ADLs.    Time 4    Period Weeks    Status Partially Met      OT SHORT TERM GOAL #3   Title Pt will demonstrate activation of wrist and digits required for a gross grasp on objects.    Time 4    Period Weeks      OT SHORT TERM GOAL #4   Title Pt will be educated on and demonstrate independence in NMR strategies to improve functional use of RUE.    Time 4    Period Weeks               OT Long Term Goals - 03/01/21 2124       OT LONG TERM GOAL #1   Title Pt will increase RUE strength to 4/5 or greater to improve ability to use RUE as active assist during ADLs.    Time 8    Period Weeks    Status On-going    Target Date 03/25/21      OT LONG TERM GOAL #2   Title Pt will demonstrate 25% or greater increase in A/ROM in RUE to improve ability to perform functional reaching tasks.    Time 8    Period Weeks      OT LONG TERM GOAL #3   Title Pt will demonstrate fine motor activation and  control by completing 9 hole peg test or box and blocks test for coordination.    Time 8    Period Weeks    Status On-going      OT LONG TERM GOAL #4   Title Pt will improve grip strength to 10# or greater and pinch strength to 5# or greater to improve ability to grasp and hold items during ADL completion.    Time 8    Period Weeks    Status On-going                   Plan - 03/12/21 1245     Clinical Impression Statement A: Focused  session on AA/ROM for the shoulder while attempting to hold onto small pink ball with both arms. Occassional repositioning of hand needed to maintain hold. Passive stretching of the right hand completed with prolonged stretch. NMES was then used to facilicate additional hand strength to achieve further finger flexion. At end of session, patient was able to passively achieve a loose fist. Continues to experience joint restrictions with right hand MCP joints. Discussed completing stretching at home also.    Body Structure / Function / Physical Skills ADL;Endurance;UE functional use;ROM;Proprioception;IADL;Strength;Edema;Tone;Sensation;Coordination;FMC    Plan P: will flexion glove be benefitual? Or does patient have too much range passively?    Consulted and Agree with Plan of Care Patient;Family member/caregiver    Family Member Consulted Granddaughter             Patient will benefit from skilled therapeutic intervention in order to improve the following deficits and impairments:   Body Structure / Function / Physical Skills: ADL, Endurance, UE functional use, ROM, Proprioception, IADL, Strength, Edema, Tone, Sensation, Coordination, [place]       Visit Diagnosis: Other lack of coordination  Other symptoms and signs involving the nervous system    Problem List Patient Active Problem List   Diagnosis Date Noted   Slow transit constipation    Dyslipidemia  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 [place], [place], [address] Phone: [phone]   Fax:  [phone]  Name: Natasha Vazquez MRN: [id number] Date of Birth: Oct 28, 1950

## 2021-03-14 ENCOUNTER — Ambulatory Visit (HOSPITAL_COMMUNITY): Payer: Medicare Other

## 2021-03-14 ENCOUNTER — Other Ambulatory Visit: Payer: Self-pay

## 2021-03-14 DIAGNOSIS — R29818 Other symptoms and signs involving the nervous system: Secondary | ICD-10-CM | POA: Diagnosis not present

## 2021-03-14 DIAGNOSIS — R278 Other lack of coordination: Secondary | ICD-10-CM

## 2021-03-14 DIAGNOSIS — R4701 Aphasia: Secondary | ICD-10-CM | POA: Diagnosis not present

## 2021-03-15 ENCOUNTER — Encounter (HOSPITAL_COMMUNITY): Payer: Self-pay

## 2021-03-15 NOTE — Therapy (Signed)
[place], [place], [address] Phone: [phone]   Fax:  [phone]  Occupational Therapy Treatment  Patient Details  Name: Natasha Vazquez MRN: [id number] Date of Birth: [birth year] Referring Provider (OT): Lauraine Rinne, PA-C   Encounter Date: 03/14/2021   OT End of Session - 03/15/21 1048     Visit Number 12    Number of Visits 24    Date for OT Re-Evaluation 03/25/21    Authorization Type 1) UHC Medicare 2) Medicaid    Authorization Time Period no visit limit    Progress Note Due on Visit 20    OT Start Time 1117    OT Stop Time 1155    OT Time Calculation (min) 38 min    Activity Tolerance Patient tolerated treatment well    Behavior During Therapy [facility] for tasks assessed/performed             Past Medical History:  Diagnosis Date   Hypertension    TMJ (dislocation of temporomandibular joint)     Past Surgical History:  Procedure Laterality Date   ABDOMINAL HYSTERECTOMY     CHOLECYSTECTOMY     IR ANGIO INTRA EXTRACRAN SEL INTERNAL CAROTID BILAT MOD SED  09/16/2016   IR ANGIO VERTEBRAL SEL VERTEBRAL BILAT MOD SED  09/16/2016   MASS EXCISION Left 02/10/2020   Procedure: excision of soft tissue mass left upper back;  Surgeon: Jesusita Oka, MD;  Location: [facility];  Service: General;  Laterality: Left;    There were no vitals filed for this visit.   Subjective Assessment - 03/15/21 1034     Subjective  S: Nothing new to report. Pt is aphasic.    Currently in Pain? No/denies                [facility] OT Assessment - 03/15/21 1035       Assessment   Medical Diagnosis s/p L Ischemic MCA CVA      Precautions   Precautions None                      OT Treatments/Exercises (OP) - 03/15/21 1035       Exercises   Exercises Hand      Neurological Re-education Exercises   Finger Flexion P/ROM Finger flexion with prolonged stretch completed    Finger  Extension composite digit flexion/extension 10X, A/ROM    Other Grasp and Release Exercises  With use of NMES, patient completed active assisted composite finger flexion and active finger extension during off ramp.      Modalities   Modalities Teacher, English as a foreign language Location right hand finger flexors    Electrical Stimulation Action Engineer, petroleum Parameters 40 mA CC 5/5 cycle    Electrical Stimulation Goals Neuromuscular facilitation                      OT Short Term Goals - 03/01/21 2123       OT SHORT TERM GOAL #1   Title Pt will be provided with and educated on HEP to improve mobility of RUE as required for ADL completion.    Time 4    Period Weeks    Status On-going    Target Date 02/23/21      OT SHORT TERM GOAL #2   Title Pt will increase RUE strength to 4-/5 to  improve ability to use RUE as assist when carrying items during ADLs.    Time 4    Period Weeks    Status Partially Met      OT SHORT TERM GOAL #3   Title Pt will demonstrate activation of wrist and digits required for a gross grasp on objects.    Time 4    Period Weeks      OT SHORT TERM GOAL #4   Title Pt will be educated on and demonstrate independence in NMR strategies to improve functional use of RUE.    Time 4    Period Weeks               OT Long Term Goals - 03/01/21 2124       OT LONG TERM GOAL #1   Title Pt will increase RUE strength to 4/5 or greater to improve ability to use RUE as active assist during ADLs.    Time 8    Period Weeks    Status On-going    Target Date 03/25/21      OT LONG TERM GOAL #2   Title Pt will demonstrate 25% or greater increase in A/ROM in RUE to improve ability to perform functional reaching tasks.    Time 8    Period Weeks      OT LONG TERM GOAL #3   Title Pt will demonstrate fine motor activation and control by completing 9 hole peg test or box and blocks test for  coordination.    Time 8    Period Weeks    Status On-going      OT LONG TERM GOAL #4   Title Pt will improve grip strength to 10# or greater and pinch strength to 5# or greater to improve ability to grasp and hold items during ADL completion.    Time 8    Period Weeks    Status On-going                   Plan - 03/15/21 1049     Clinical Impression Statement A: Session focused on utilizing NMES to provide neuromuscular strengthening. Patient is unable to demonstrate any active MCP extension for the thumb and index finger. Passive prolonged stretching completed to composite MCP joints. Pt presents with increase joint limitations in the MCP joints of the thumb and index. Pt is progressing with forming a loose gross grasp regardless of limited joint mobility.    Body Structure / Function / Physical Skills ADL;Endurance;UE functional use;ROM;Proprioception;IADL;Strength;Edema;Tone;Sensation;Coordination;FMC    Plan P: Continue to work on shoulder stability at or under shoulder level. Continue to work on increasing passive and active movement in the hand to increase ability to grasp. Complete lateral pinch activity.    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

## 2021-03-19 ENCOUNTER — Ambulatory Visit (HOSPITAL_COMMUNITY): Payer: Medicare Other | Admitting: Occupational Therapy

## 2021-03-19 ENCOUNTER — Other Ambulatory Visit: Payer: Self-pay

## 2021-03-19 ENCOUNTER — Encounter (HOSPITAL_COMMUNITY): Payer: Self-pay | Admitting: Occupational Therapy

## 2021-03-19 DIAGNOSIS — R278 Other lack of coordination: Secondary | ICD-10-CM

## 2021-03-19 DIAGNOSIS — R29818 Other symptoms and signs involving the nervous system: Secondary | ICD-10-CM | POA: Diagnosis not present

## 2021-03-19 DIAGNOSIS — R4701 Aphasia: Secondary | ICD-10-CM | POA: Diagnosis not present

## 2021-03-19 NOTE — Therapy (Addendum)
[place], [place], [address] Phone: [phone]   Fax:  [phone]  Occupational Therapy Treatment  Patient Details  Name: Natasha Vazquez MRN: [id number] Date of Birth: 1950-03-21 Referring Provider (OT): Lauraine Rinne, PA-C   Encounter Date: 03/19/2021   OT End of Session - 03/19/21 1259     Visit Number 13    Number of Visits 24    Date for OT Re-Evaluation 03/25/21    Authorization Type 1) UHC Medicare 2) Medicaid    Authorization Time Period no visit limit    Progress Note Due on Visit 39    OT Start Time 1112    OT Stop Time 1155    OT Time Calculation (min) 43 min    Activity Tolerance Patient tolerated treatment well    Behavior During Therapy [facility] for tasks assessed/performed             Past Medical History:  Diagnosis Date   Hypertension    TMJ (dislocation of temporomandibular joint)     Past Surgical History:  Procedure Laterality Date   ABDOMINAL HYSTERECTOMY     CHOLECYSTECTOMY     IR ANGIO INTRA EXTRACRAN SEL INTERNAL CAROTID BILAT MOD SED  09/16/2016   IR ANGIO VERTEBRAL SEL VERTEBRAL BILAT MOD SED  09/16/2016   MASS EXCISION Left 02/10/2020   Procedure: excision of soft tissue mass left upper back;  Surgeon: Jesusita Oka, MD;  Location: [facility];  Service: General;  Laterality: Left;    There were no vitals filed for this visit.   Subjective Assessment - 03/19/21 1111     Subjective  S: Nothing new to report. Pt is aphasic and indicates her fingers are tight.    Patient is accompanied by: Family member   granddaughter   Currently in Pain? No/denies                [facility] OT Assessment - 03/19/21 1111       Assessment   Medical Diagnosis s/p L Ischemic MCA CVA      Precautions   Precautions None                      OT Treatments/Exercises (OP) - 03/19/21 1202       Exercises   Exercises Hand      Neurological Re-education  Exercises   Finger Flexion P/ROM Finger flexion with prolonged stretch completed 5X    Finger Extension composite digit flexion/extension 10X, A/ROM    Thumb Opposition P/ROM 5X    Other Exercises 1 holding volleyball between hands, protraction, flexion, 10X each    Other Grasp and Release Exercises  grasp and squeeze cone shaped stress ball, 5X    Other Grasp and Release Exercises  working on lateral pinch-pt gathering washcloth in fingers and lifting off table 10X; grasping high resistance sponges between fingers and placing across the table with set-up 5X; set-up for grasping yellow clothespins on middle bar of pinch tree then pt squeezing and removing from bar.    Development of Reach Reaching    Reaching to Shoulder Height Pt seated at tabletop and grasping cone and reaching into protraction to place onto one bucket. Completed 10X with OT stabilizing cones and cuing to only reach with arm and not lean forward.                      OT Short Term Goals -  03/01/21 2123       OT SHORT TERM GOAL #1   Title Pt will be provided with and educated on HEP to improve mobility of RUE as required for ADL completion.    Time 4    Period Weeks    Status On-going    Target Date 02/23/21      OT SHORT TERM GOAL #2   Title Pt will increase RUE strength to 4-/5 to improve ability to use RUE as assist when carrying items during ADLs.    Time 4    Period Weeks    Status Partially Met      OT SHORT TERM GOAL #3   Title Pt will demonstrate activation of wrist and digits required for a gross grasp on objects.    Time 4    Period Weeks      OT SHORT TERM GOAL #4   Title Pt will be educated on and demonstrate independence in NMR strategies to improve functional use of RUE.    Time 4    Period Weeks               OT Long Term Goals - 03/01/21 2124       OT LONG TERM GOAL #1   Title Pt will increase RUE strength to 4/5 or greater to improve ability to use RUE as active assist  during ADLs.    Time 8    Period Weeks    Status On-going    Target Date 03/25/21      OT LONG TERM GOAL #2   Title Pt will demonstrate 25% or greater increase in A/ROM in RUE to improve ability to perform functional reaching tasks.    Time 8    Period Weeks      OT LONG TERM GOAL #3   Title Pt will demonstrate fine motor activation and control by completing 9 hole peg test or box and blocks test for coordination.    Time 8    Period Weeks    Status On-going      OT LONG TERM GOAL #4   Title Pt will improve grip strength to 10# or greater and pinch strength to 5# or greater to improve ability to grasp and hold items during ADL completion.    Time 8    Period Weeks    Status On-going                   Plan - 03/19/21 1300     Clinical Impression Statement A: Activities focusing on shoulder strength and stability with reaching activities, min tactile cuing to sit tall but not lean forward required. Also working on development of grasp and lateral pinch. Pt with full ROM with passive stretching, improvement in A/ROM after passive stretching and joint mobility work in the hands and digits. Pt with some ROM noted in thumb, continues to be unable to actively extend index PIP or DIP joints. Pt working on lateral pinch with washcloth, sponges, and yellow clothespins, min/mod difficulty grasping but max difficulty releasing.    Body Structure / Function / Physical Skills ADL;Endurance;UE functional use;ROM;Proprioception;IADL;Strength;Edema;Tone;Sensation;Coordination;FMC    Plan P: Continue to work on shoulder stability at or under shoulder level. Cotninue with grasp and lateral pinch work    OT Home Exercise Plan eval: weightbearing; 1/3: shoulder and elbow AA/ROM 1/12: P/ROM fingers/thumb. Ball roll wrist flexion/extension; 1/25: wrist and forearm A/ROM    Consulted and Agree with Plan of Care Patient  Patient will benefit from skilled therapeutic intervention in  order to improve the following deficits and impairments:   Body Structure / Function / Physical Skills: ADL, Endurance, UE functional use, ROM, Proprioception, IADL, Strength, Edema, Tone, Sensation, Coordination, [facility]       Visit Diagnosis: Other lack of coordination  Other symptoms and signs involving the nervous system    Problem List Patient Active Problem List   Diagnosis Date Noted   Slow transit constipation    Dyslipidemia    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

## 2021-03-20 ENCOUNTER — Encounter (HOSPITAL_COMMUNITY): Payer: Medicare Other | Admitting: Speech Pathology

## 2021-03-21 ENCOUNTER — Encounter (HOSPITAL_COMMUNITY): Payer: Self-pay | Admitting: Occupational Therapy

## 2021-03-21 ENCOUNTER — Ambulatory Visit (HOSPITAL_COMMUNITY): Payer: Medicare Other | Admitting: Speech Pathology

## 2021-03-21 ENCOUNTER — Encounter (HOSPITAL_COMMUNITY): Payer: Self-pay | Admitting: Speech Pathology

## 2021-03-21 ENCOUNTER — Ambulatory Visit (HOSPITAL_COMMUNITY): Payer: Medicare Other | Admitting: Occupational Therapy

## 2021-03-21 ENCOUNTER — Other Ambulatory Visit: Payer: Self-pay

## 2021-03-21 DIAGNOSIS — R4701 Aphasia: Secondary | ICD-10-CM

## 2021-03-21 DIAGNOSIS — R29818 Other symptoms and signs involving the nervous system: Secondary | ICD-10-CM | POA: Diagnosis not present

## 2021-03-21 DIAGNOSIS — R41841 Cognitive communication deficit: Secondary | ICD-10-CM

## 2021-03-21 DIAGNOSIS — R278 Other lack of coordination: Secondary | ICD-10-CM

## 2021-03-21 NOTE — Therapy (Signed)
[place], [place], [address] Phone: [phone]   Fax:  [phone]  Speech Language Pathology Treatment  Patient Details  Name: Natasha Vazquez MRN: [id number] Date of Birth: 03-06-1950 Referring Provider (SLP): Cathlyn Parsons, PA-C   Encounter Date: 03/21/2021   End of Session - 03/21/21 1149     Visit Number 9    Number of Visits 16    Date for SLP Re-Evaluation 03/28/21    Authorization Type UHC Medicare    Progress Note Due on Visit --   10th visit   SLP Start Time 1030    SLP Stop Time  1115    SLP Time Calculation (min) 45 min    Activity Tolerance Patient tolerated treatment well             Past Medical History:  Diagnosis Date   Hypertension    TMJ (dislocation of temporomandibular joint)     Past Surgical History:  Procedure Laterality Date   ABDOMINAL HYSTERECTOMY     CHOLECYSTECTOMY     IR ANGIO INTRA EXTRACRAN SEL INTERNAL CAROTID BILAT MOD SED  09/16/2016   IR ANGIO VERTEBRAL SEL VERTEBRAL BILAT MOD SED  09/16/2016   MASS EXCISION Left 02/10/2020   Procedure: excision of soft tissue mass left upper back;  Surgeon: Jesusita Oka, MD;  Location: [place];  Service: General;  Laterality: Left;    There were no vitals filed for this visit.   Subjective Assessment - 03/21/21 1141     Subjective "Tiger"    Patient is accompained by: Family member    Currently in Pain? 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 She was [facility] on 01/15/21  and her Natasha Vazquez, Blanch Media moved in to help care for her.      Treatment Provided   Treatment provided Cognitive-Linquistic      Pain Assessment   Pain Assessment No/denies pain      Cognitive-Linquistic Treatment   Treatment focused on Aphasia;Patient/family/caregiver education    Skilled Treatment SLP provided multimodality communication strategies to elicit word productions (including melodic intonation, phrase completion, modeling, gestures, breaking perseverative responses, and written cues) and target of auditory and reading comprehension.      Assessment / Recommendations / Plan   Plan Continue with current plan of care      Progression Toward Goals   Progression toward goals Progressing toward goals              SLP Education - 03/21/21 1145     Education Details Sent home additional reading comprehension work for [employer]) Educated Patient;Caregiver(s)    Methods Explanation;Handout    Comprehension Verbalized understanding              SLP Short Term Goals - 03/21/21 1149       SLP SHORT TERM GOAL #1   Title Patient will point to objects and object pictures/photos when named in field of 4 with  80% accuracy and cue for error awareness prn.    Baseline F=2, 90%, F=3, 80%, F=4, 70%    Time 4    Period Weeks    Status On-going    Target Date 03/28/21      SLP SHORT TERM GOAL #2   Title Pt will increase naming of common objects/pictures to 80% acc when provided with mod/max cues.    Baseline 0% independently, 66% with max cues    Time 4    Period Weeks    Status On-going    Target Date 03/28/21      SLP SHORT TERM GOAL #3   Title Pt will complete single word sentence completion tasks with set list of high frequency words with 80% acc when provided picture cue and mod prompts from SLP.    Baseline 70% max assist    Time 4    Period Weeks    Status On-going    Target Date 03/28/21      SLP SHORT TERM GOAL #4   Title Pt will provide a gesture for  set list of 20 words with 90% effectiveness and mi/mod prompt via modeling from SLP.    Baseline 75% max assist    Time 4    Period Weeks    Status On-going    Target Date 03/28/21      SLP SHORT TERM GOAL #5   Title Pt will verbalize automatic and melodic tasks during faded choral singing/talking activities with allowance for approximations and 80% acc with mod assist from SLP.    Baseline 50% max assist    Time 4    Period Weeks    Status On-going    Target Date 03/28/21              SLP Long Term Goals - 03/21/21 1150       SLP LONG TERM GOAL #1   Title Pt will communicate basic wants/needs to [facility] via multimodality communication strategies with mi/mod assist from caregivers.    Baseline Total assist    Time 3    Period Months    Status On-going      SLP LONG TERM GOAL #2   Title Pt will increase auditory comprehension for basic level information to [facility] with use of multimodality cues and min assist from caregivers.    Baseline mod assist    Time 3    Period Months    Status On-going              Plan - 03/21/21 1149     Clinical Impression Statement Pt was accompanied by her granddaughter, Natasha Vazquez, for therapy today. Pt and granddaughter report that Pt has been frustrated with her speech progress. Notes from recent MD visit reviewed as well with similar reports. Pt has only attended 8 treatment sessions since her evaluation and she is making good progress, but it is difficult for Pt to see that. She has two more appointments scheduled with me next week and she says that she might like to take a break from SLP therapy and resume after a couple of months. Will plan to repeat the MAST next session to update progress. She completed basic level reading comprehension tasks at the word level with 70% acc with mod cues. Next session, update goals. Pt was Vazquez information regarding Lingraphica communication device (could possibly qualify in a few months) and Tactus therapy  apps.    Speech Therapy Frequency 2x / week    Duration 1  week    Treatment/Interventions SLP instruction and feedback;Compensatory strategies;Patient/family education;Cueing hierarchy;Multimodal communcation approach;Language facilitation;Compensatory techniques    Potential to Achieve Goals Good    Potential Considerations Severity of impairments    SLP Home Exercise Plan Pt will completed HEP as assigned to facilitate carryover of treatment strategies and techniques in home environment with use of written cues as needed.    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 [place], [place], [address] Phone: [phone]   Fax:  [phone]   Name: Natasha Vazquez MRN: [id number] Date of Birth: 27-Nov-1950

## 2021-03-21 NOTE — Therapy (Addendum)
[place], [place], [address] Phone: [phone]   Fax:  [phone]  Occupational Therapy Treatment  Patient Details  Name: Natasha Vazquez MRN: [id number] Date of Birth: 1950/06/10 Referring Provider (OT): Lauraine Rinne, PA-C   Encounter Date: 03/21/2021   OT End of Session - 03/21/21 1200     Visit Number 14    Number of Visits 24    Date for OT Re-Evaluation 03/25/21    Authorization Type 1) UHC Medicare 2) Medicaid    Authorization Time Period no visit limit    Progress Note Due on Visit 36    OT Start Time 1119    OT Stop Time 1158    OT Time Calculation (min) 39 min    Activity Tolerance Patient tolerated treatment well    Behavior During Therapy [facility] for tasks assessed/performed             Past Medical History:  Diagnosis Date   Hypertension    TMJ (dislocation of temporomandibular joint)     Past Surgical History:  Procedure Laterality Date   ABDOMINAL HYSTERECTOMY     CHOLECYSTECTOMY     IR ANGIO INTRA EXTRACRAN SEL INTERNAL CAROTID BILAT MOD SED  09/16/2016   IR ANGIO VERTEBRAL SEL VERTEBRAL BILAT MOD SED  09/16/2016   MASS EXCISION Left 02/10/2020   Procedure: excision of soft tissue mass left upper back;  Surgeon: Jesusita Oka, MD;  Location: [place];  Service: General;  Laterality: Left;    There were no vitals filed for this visit.   Subjective Assessment - 03/21/21 1120     Subjective  S: Terrible. (when asked how she was)    Patient is accompanied by: Family member   granddaughter               [facility] OT Assessment - 03/21/21 1119       Assessment   Medical Diagnosis s/p L Ischemic MCA CVA      Precautions   Precautions None                      OT Treatments/Exercises (OP) - 03/21/21 1125       Exercises   Exercises Hand      Neurological Re-education Exercises   Forearm Supination AROM;10 reps    Forearm Pronation  AROM;10 reps    Wrist Flexion AROM;10 reps    Wrist Extension AROM;10 reps    Finger Flexion P/ROM Finger flexion with prolonged stretch completed 5X    Finger Extension composite digit flexion/extension 5X, A/ROM    Other Exercises 1 holding volleyball between hands, protraction, flexion, 10X each; holding boomwacker pt completing flexion AA/ROM 10X with OT min guard at elbow    Other Grasp and Release Exercises  grasp and squeeze cone shaped stress ball, 10X; pt removing 10 large pegs from pegboard and placing into bucket top today.    Other Grasp and Release Exercises  Working on lateral pinch- set-up for grasping yellow clothespins on middle bar of pinch tree then pt squeezing and removing from bar in 3 point pinch fashion    Development of Reach Reaching    Reaching to Shoulder Height Pt seated at tabletop and grasping cone and reaching into protraction to stack. Completed 10X with OT stabilizing cones and cuing to only reach with arm and not lean forward.  OT Short Term Goals - 03/01/21 2123       OT SHORT TERM GOAL #1   Title Pt will be provided with and educated on HEP to improve mobility of RUE as required for ADL completion.    Time 4    Period Weeks    Status On-going    Target Date 02/23/21      OT SHORT TERM GOAL #2   Title Pt will increase RUE strength to 4-/5 to improve ability to use RUE as assist when carrying items during ADLs.    Time 4    Period Weeks    Status Partially Met      OT SHORT TERM GOAL #3   Title Pt will demonstrate activation of wrist and digits required for a gross grasp on objects.    Time 4    Period Weeks      OT SHORT TERM GOAL #4   Title Pt will be educated on and demonstrate independence in NMR strategies to improve functional use of RUE.    Time 4    Period Weeks               OT Long Term Goals - 03/01/21 2124       OT LONG TERM GOAL #1   Title Pt will increase RUE strength to 4/5 or greater  to improve ability to use RUE as active assist during ADLs.    Time 8    Period Weeks    Status On-going    Target Date 03/25/21      OT LONG TERM GOAL #2   Title Pt will demonstrate 25% or greater increase in A/ROM in RUE to improve ability to perform functional reaching tasks.    Time 8    Period Weeks      OT LONG TERM GOAL #3   Title Pt will demonstrate fine motor activation and control by completing 9 hole peg test or box and blocks test for coordination.    Time 8    Period Weeks    Status On-going      OT LONG TERM GOAL #4   Title Pt will improve grip strength to 10# or greater and pinch strength to 5# or greater to improve ability to grasp and hold items during ADL completion.    Time 8    Period Weeks    Status On-going                   Plan - 03/21/21 1201     Clinical Impression Statement A: Pt reports knee pain today, granddaughter thinks her fingers may be sore as well as pt was tender when trying to get edema glove on yesterday. Continued with functional reaching activities combining grasp when moving cones and using boomwacker for AA/ROM flexion. Kickball used behind pt's back to maintain posture and cue to not lean back into chair during tasks. Continued with fine motor activities working on functional grasp and pinch. Pt noted to have approximately 75% of a fist by end of session. Increased time for fine motor planning. Verbal and visual cuing for techniques.    Body Structure / Function / Physical Skills ADL;Endurance;UE functional use;ROM;Proprioception;IADL;Strength;Edema;Tone;Sensation;Coordination;FMC    Plan P: Reassessment, recertification    OT Home Exercise Plan eval: weightbearing; 1/3: shoulder and elbow AA/ROM 1/12: P/ROM fingers/thumb. Ball roll wrist flexion/extension; 1/25: wrist and forearm A/ROM    Consulted and Agree with Plan of Care Patient    Family Member Consulted  Granddaughter             Patient will benefit from skilled  therapeutic intervention in order to improve the following deficits and impairments:   Body Structure / Function / Physical Skills: ADL, Endurance, UE functional use, ROM, Proprioception, IADL, Strength, Edema, Tone, Sensation, Coordination, [facility]       Visit Diagnosis: Other lack of coordination  Other symptoms and signs involving the nervous system    Problem List Patient Active Problem List   Diagnosis Date Noted   Slow transit constipation    Dyslipidemia    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

## 2021-03-25 ENCOUNTER — Ambulatory Visit: Payer: Medicare Other | Admitting: Neurology

## 2021-03-26 ENCOUNTER — Other Ambulatory Visit: Payer: Self-pay

## 2021-03-26 ENCOUNTER — Encounter (HOSPITAL_COMMUNITY): Payer: Self-pay

## 2021-03-26 ENCOUNTER — Ambulatory Visit (HOSPITAL_COMMUNITY): Payer: Medicare Other | Admitting: Speech Pathology

## 2021-03-26 ENCOUNTER — Ambulatory Visit (HOSPITAL_COMMUNITY): Payer: Medicare Other

## 2021-03-26 ENCOUNTER — Encounter (HOSPITAL_COMMUNITY): Payer: Medicare Other | Admitting: Speech Pathology

## 2021-03-26 DIAGNOSIS — R29818 Other symptoms and signs involving the nervous system: Secondary | ICD-10-CM | POA: Diagnosis not present

## 2021-03-26 DIAGNOSIS — R278 Other lack of coordination: Secondary | ICD-10-CM | POA: Diagnosis not present

## 2021-03-26 DIAGNOSIS — R4701 Aphasia: Secondary | ICD-10-CM

## 2021-03-26 DIAGNOSIS — R41841 Cognitive communication deficit: Secondary | ICD-10-CM

## 2021-03-26 NOTE — Patient Instructions (Signed)
Putty Home Exercise Program  Complete 1-2 times a day.  putty squeeze  Pt. should squeeze putty in hand trying to keep it round by rotating putty after each squeeze. push fingers through putty to palm each time. Complete for __1____ minute.   PUTTY KEY GRIP  Hold the putty at the top of your hand. Squeeze the putty between your thumb and the side of your 2nd finger as shown. Complete for ___1_____ minute.    PUTTY 3 JAW CHUCK  Roll up some putty into a ball then flatten it. Then, firmly squeeze it with your first 3 fingers as shown. Complete for __1____ minute.

## 2021-03-26 NOTE — Therapy (Signed)
[place], [place], [address] Phone: [phone]   Fax:  [phone]  Speech Language Pathology Treatment  Patient Details  Name: Natasha Vazquez MRN: [id number] Date of Birth: [birth year] Referring Provider (SLP): Cathlyn Parsons, PA-C   Encounter Date: 03/26/2021   End of Session - 03/26/21 1816     Visit Number 10    Number of Visits 16    Date for SLP Re-Evaluation 03/28/21    Authorization Type UHC Medicare    Progress Note Due on Visit --   10th visit   SLP Start Time 1515    SLP Stop Time  1600    SLP Time Calculation (min) 45 min    Activity Tolerance Patient tolerated treatment well             Past Medical History:  Diagnosis Date   Hypertension    TMJ (dislocation of temporomandibular joint)     Past Surgical History:  Procedure Laterality Date   ABDOMINAL HYSTERECTOMY     CHOLECYSTECTOMY     IR ANGIO INTRA EXTRACRAN SEL INTERNAL CAROTID BILAT MOD SED  09/16/2016   IR ANGIO VERTEBRAL SEL VERTEBRAL BILAT MOD SED  09/16/2016   MASS EXCISION Left 02/10/2020   Procedure: excision of soft tissue mass left upper back;  Surgeon: Jesusita Oka, MD;  Location: [place];  Service: General;  Laterality: Left;    There were no vitals filed for this visit.   Subjective Assessment - 03/26/21 1815     Subjective "Talking"    Patient is accompained by: Family member    Currently in Pain? 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 She was discharged from [facility] inpatient rehab on  01/15/21 and her daughter, Blanch Media moved in to help care for her.      Treatment Provided   Treatment provided Cognitive-Linquistic      Pain Assessment   Pain Assessment No/denies pain      Cognitive-Linquistic Treatment   Treatment focused on Aphasia;Patient/family/caregiver education    Skilled Treatment SLP provided multimodality communication strategies to elicit word productions (including melodic intonation, phrase completion, modeling, gestures, breaking perseverative responses, and written cues) and target of auditory and reading comprehension.      Assessment / Recommendations / Plan   Plan Other (Comment);Discharge SLP treatment due to (comment)                SLP Short Term Goals - 03/26/21 1816       SLP SHORT TERM GOAL #1   Title Patient will point to objects and object pictures/photos when named in field of 4 with 80% accuracy and cue for error awareness prn.    Baseline F=2, 90%, F=3, 80%, F=4, 70%    Time 4    Period Weeks    Status Achieved    Target Date 03/28/21      SLP SHORT TERM GOAL #2   Title Pt will increase naming of common objects/pictures to 80%  acc when provided with mod/max cues.    Baseline 0% independently, 66% with max cues    Time 4    Period Weeks    Status Achieved    Target Date 03/28/21      SLP SHORT TERM GOAL #3   Title Pt will complete single word sentence completion tasks with set list of high frequency words with 80% acc when provided picture cue and mod prompts from SLP.    Baseline 70% max assist    Time 4    Period Weeks    Status Achieved    Target Date 03/28/21      SLP SHORT TERM GOAL #4   Title Pt will provide a gesture for set list of 20 words with 90% effectiveness and mi/mod prompt via modeling from SLP.    Baseline 75% max assist    Time 4    Period Weeks    Status Partially Met    Target Date 03/28/21      SLP SHORT TERM GOAL #5   Title Pt will verbalize automatic and melodic tasks during faded choral  singing/talking activities with allowance for approximations and 80% acc with mod assist from SLP.    Baseline 50% max assist    Time 4    Period Weeks    Status Achieved    Target Date 03/28/21              SLP Long Term Goals - 03/26/21 1817       SLP LONG TERM GOAL #1   Title Pt will communicate basic wants/needs to [facility] via multimodality communication strategies with mi/mod assist from caregivers.    Baseline Total assist    Time 3    Period Months    Status Partially Met      SLP LONG TERM GOAL #2   Title Pt will increase auditory comprehension for basic level information to [facility] with use of multimodality cues and min assist from caregivers.    Baseline mod assist    Time 3    Period Months    Status Partially Met            MAST Pt was administered the [place] Aphasia Screening Tool (MAST) and achieved an overall expressive score of 15/50, receptive score of 26/50, and a total score of 43/100.  Expressive Index Naming 0/10 Automatic Speech 7/10 Repetition 4/10 Writing 0/10 Verbal Fluency 4/10 Expressive Subscale 15/50  Receptive Index Yes/No Accuracy 18/20 Object Recognition 4/10 Following Instructions 2/10 Reading Instructions 0/10 Receptive Subscale 26/50  Total Index Expressive 15/50, initial evaluation: 2/50 Receptive 28/50, initial evaluation: 28/50 Total Score 43/100, initial evaluation: 28/100   Plan - 03/26/21 1816     Clinical Impression Statement Pt was accompanied by her granddaughter, Natasha Vazquez, for therapy today. Natasha Vazquez stated that Pt would like to take a break from SLP therapy due to frustration. It is difficult for Pt to see progress, but she has made great progress since her stroke. The MAST was re administered, see results above. Pt with the most improvement in automatic speech and repetition. She continues to have the greatest difficulty with confrontation naming due to severity of apraxia and perseveration. When she first started, she  was unable to name any objects and after therapy focusing on set list of 20+ words, Pt has successfully labeled 10 words with some consistency (pizza, apple, key, cow, cat, cookie, socks, baby, fish, and ball). She can complete single word sentence completion tasks with list of  20+ words with ~88% acc. Pt is able to use her set list of personally relevant written words with ~75% acc with mi/mod assist. Pt continues to perseverate on "talking" and requires mod cues to break this. Pt would like to take a break from SLP therapy, but I am hopeful that she will return in a couple of months. She was Vazquez a home program to continue with and also shown different aphasia applications (Tactus and Lingraphica). Will d/c Pt from SLP services at this time. Should she decide to come back, will need MD order. Pt's granddaughter acknowledges.   Treatment/Interventions SLP instruction and feedback;Compensatory strategies;Patient/family education;Cueing hierarchy;Multimodal communcation approach;Language facilitation;Compensatory techniques    Potential to Achieve Goals Good    Potential Considerations Severity of impairments    SLP Home Exercise Plan Pt will completed HEP as assigned to facilitate carryover of treatment strategies and techniques in home environment with use of written cues as needed.    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   Chronic heart failure with preserved ejection fraction (HFpEF) ([place]) 01/02/2021   Acute ischemic stroke ([place]) 12/26/2020   Essential hypertension 12/26/2020    Mixed hyperlipidemia 12/26/2020   Acute ischemic left MCA stroke ([place]) 12/26/2020   SPEECH THERAPY DISCHARGE SUMMARY  Visits from Start of Care: 10  Current functional level related to goals / functional outcomes: Pt made good progress toward goals, see above   Remaining deficits: Severe expressive aphasia with apraxia, moderate receptive aphasia, see above   Education / Equipment: HEP provided; Pt wishes to take a break from SLP services at this time and may resume in the future.    Patient agrees to discharge. Patient goals were partially met. Patient is being discharged due to the patient's request..    Thank you,  Genene Churn, [place]  [place] 03/26/2021, 6:18 PM  [place] [address] [place], [place], [address] Phone: [phone]   Fax:  [phone]   Name: Natasha Vazquez MRN: [id number] Date of Birth: 22-Aug-1950

## 2021-03-27 NOTE — Therapy (Signed)
[place], [place], [address] Phone: [phone]   Fax:  [phone]  Occupational Therapy Treatment  Patient Details  Name: Natasha Vazquez MRN: [id number] Date of Birth: 06/10/1950 Referring Provider (OT): Lauraine Rinne, PA-C  Progress Note Reporting Period 02/27/21 to 03/26/21  See note below for Objective Data and Assessment of Progress/Goals.      Encounter Date: 03/26/2021   OT End of Session - 03/26/21 1629     Visit Number 15    Number of Visits 24    Date for OT Re-Evaluation 04/23/21    Authorization Type 1) UHC Medicare 2) Medicaid    Authorization Time Period no visit limit    Progress Note Due on Visit 25    OT Start Time 1607    OT Stop Time 1650    OT Time Calculation (min) 43 min    Activity Tolerance Patient tolerated treatment well    Behavior During Therapy WFL for tasks assessed/performed             Past Medical History:  Diagnosis Date   Hypertension    TMJ (dislocation of temporomandibular joint)     Past Surgical History:  Procedure Laterality Date   ABDOMINAL HYSTERECTOMY     CHOLECYSTECTOMY     IR ANGIO INTRA EXTRACRAN SEL INTERNAL CAROTID BILAT MOD SED  09/16/2016   IR ANGIO VERTEBRAL SEL VERTEBRAL BILAT MOD SED  09/16/2016   MASS EXCISION Left 02/10/2020   Procedure: excision of soft tissue mass left upper back;  Surgeon: Jesusita Oka, MD;  Location: [place];  Service: General;  Laterality: Left;    There were no vitals filed for this visit.   Subjective Assessment - 03/26/21 1611     Subjective  S: Granddaughter reports that they had to cancel her Neurologist appointment yesterday because he was sick. They still need to reschedule.    Patient is accompanied by: Family member   granddaughter   Currently in Pain? No/denies                [facility] OT Assessment - 03/26/21 1611       Assessment   Medical Diagnosis s/p L Ischemic MCA CVA       Precautions   Precautions None      Prior Function   Level of Independence Independent      Coordination   9 Hole Peg Test --   unable to test   Box and Blocks Modified using high resistive sponges. 6 moved in 1'      ROM / Strength   AROM / PROM / Strength Strength      Strength   Strength Assessment Site Hand    Right/Left Shoulder Right    Right Shoulder Flexion 3-/5   previous: same   Right Shoulder ABduction 3-/5   previous: same   Right Shoulder Internal Rotation 5/5   previous: 3-5   Right Shoulder External Rotation 5/5   previous: 3+/5   Right/Left Elbow Right    Right Elbow Flexion 5/5   previous: 4/5   Right Elbow Extension 5/5   previous: 4-/5   Right/Left Forearm Right    Right Forearm Pronation 4+/5   previous: same   Right Forearm Supination 4+/5   previous: 4-/5   Right/Left Wrist Right    Right Wrist Flexion 4/5   previous: 4-/5   Right Wrist Extension 4/5   previous: 3-/5   Right Wrist  Radial Deviation 3+/5   previous: 3-/5   Right Wrist Ulnar Deviation 3+/5   previous: 3-/5   Right/Left hand Right    Right Hand Grip (lbs) 0   previous: same   Right Hand Lateral Pinch 2 lbs   previous: 1.5   Right Hand 3 Point Pinch 0 lbs   previous: same                     OT Treatments/Exercises (OP) - 03/26/21 1630       Exercises   Exercises Hand                    OT Education - 03/27/21 1020     Education Details Provided container of Playdoh for HEP to strengthening hand - grip and pinch    Person(s) Educated Patient;Other (comment)   Granddaughter   Methods Explanation;Demonstration;Verbal cues;Handout    Comprehension Verbalized understanding;Returned demonstration;Need further instruction;Verbal cues required              OT Short Term Goals - 03/01/21 2123       OT SHORT TERM GOAL #1   Title Pt will be provided with and educated on HEP to improve mobility of RUE as required for ADL completion.    Time 4    Period  Weeks    Status On-going    Target Date 02/23/21      OT SHORT TERM GOAL #2   Title Pt will increase RUE strength to 4-/5 to improve ability to use RUE as assist when carrying items during ADLs.    Time 4    Period Weeks    Status Partially Met      OT SHORT TERM GOAL #3   Title Pt will demonstrate activation of wrist and digits required for a gross grasp on objects.    Time 4    Period Weeks      OT SHORT TERM GOAL #4   Title Pt will be educated on and demonstrate independence in NMR strategies to improve functional use of RUE.    Time 4    Period Weeks               OT Long Term Goals - 03/01/21 2124       OT LONG TERM GOAL #1   Title Pt will increase RUE strength to 4/5 or greater to improve ability to use RUE as active assist during ADLs.    Time 8    Period Weeks    Status On-going    Target Date 03/25/21      OT LONG TERM GOAL #2   Title Pt will demonstrate 25% or greater increase in A/ROM in RUE to improve ability to perform functional reaching tasks.    Time 8    Period Weeks      OT LONG TERM GOAL #3   Title Pt will demonstrate fine motor activation and control by completing 9 hole peg test or box and blocks test for coordination.    Time 8    Period Weeks    Status On-going      OT LONG TERM GOAL #4   Title Pt will improve grip strength to 10# or greater and pinch strength to 5# or greater to improve ability to grasp and hold items during ADL completion.    Time 8    Period Weeks    Status On-going  Plan - 03/27/21 1021     Clinical Impression Statement A: reassessment completed this date. Although no additional therapy goals have been met this date, patient is able to demonstrate progress. Right shoulder strength continues to be decreased, although improvement is noted with elbow, forearm, and wrist strength. Unable to demonstrate any reading for grip strength or 3 point pinch. Lateral pinch has improved to 1/2 lb.  Provided HEP to work on hand strength and reviewed. Due to hand weakness and limited MCP, PIP, and DIP joint mobility, patient has maximum difficulty being able to use her right hand for any functional grip and pinch activity. Unable to complete the 9 hole peg test or Box and Block test as standardized. She did complete a modified Box and Block test using high resistance squares. Patient will benefit from continuing skilled OT services to focus on mentioned deficits. 2X a week for 4 more weeks.    Body Structure / Function / Physical Skills ADL;Endurance;UE functional use;ROM;Proprioception;IADL;Strength;Edema;Tone;Sensation;Coordination;FMC    OT Frequency 2x / week    OT Duration 4 weeks    Plan P: Continue skilled OT services to focus on RUE (shoulder, elbow, wrist), hand strength and coordination in order to increase active use of her right UE during basic ADL tasks.    Consulted and Agree with Plan of Care Patient    Family Member Consulted Granddaughter             Patient will benefit from skilled therapeutic intervention in order to improve the following deficits and impairments:   Body Structure / Function / Physical Skills: ADL, Endurance, UE functional use, ROM, Proprioception, IADL, Strength, Edema, Tone, Sensation, Coordination, [place]       Visit Diagnosis: Other lack of coordination - Plan: Ot plan of care cert/re-cert  Other symptoms and signs involving the nervous system - Plan: Ot plan of care cert/re-cert    Problem List Patient Active Problem List   Diagnosis Date Noted   Slow transit constipation    Dyslipidemia    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 [place], [place], [address] Phone: [phone]   Fax:  [phone]  Name: Natasha Vazquez MRN: [id number] Date of Birth: 02-27-1950

## 2021-03-28 ENCOUNTER — Ambulatory Visit (HOSPITAL_COMMUNITY): Payer: Medicare Other

## 2021-03-28 ENCOUNTER — Other Ambulatory Visit: Payer: Self-pay

## 2021-03-28 ENCOUNTER — Ambulatory Visit (HOSPITAL_COMMUNITY): Payer: Medicare Other | Admitting: Speech Pathology

## 2021-03-28 DIAGNOSIS — R29818 Other symptoms and signs involving the nervous system: Secondary | ICD-10-CM

## 2021-03-28 DIAGNOSIS — R278 Other lack of coordination: Secondary | ICD-10-CM | POA: Diagnosis not present

## 2021-03-28 DIAGNOSIS — R4701 Aphasia: Secondary | ICD-10-CM | POA: Diagnosis not present

## 2021-03-30 ENCOUNTER — Encounter (HOSPITAL_COMMUNITY): Payer: Self-pay

## 2021-03-30 NOTE — Therapy (Signed)
[place], [place], [address] Phone: [phone]   Fax:  [phone]  Occupational Therapy Treatment  Patient Details  Name: Natasha Vazquez MRN: [id number] Date of Birth: 06-01-1950 Referring Provider (OT): Lauraine Rinne, PA-C   Encounter Date: 03/28/2021   OT End of Session - 03/30/21 1135     Visit Number 16    Number of Visits 24    Date for OT Re-Evaluation 04/23/21    Authorization Type 1) UHC Medicare 2) Medicaid    Authorization Time Period no visit limit    Progress Note Due on Visit 25    OT Start Time 1305    OT Stop Time 1344    OT Time Calculation (min) 39 min    Activity Tolerance Patient tolerated treatment well    Behavior During Therapy [facility] for tasks assessed/performed             Past Medical History:  Diagnosis Date   Hypertension    TMJ (dislocation of temporomandibular joint)     Past Surgical History:  Procedure Laterality Date   ABDOMINAL HYSTERECTOMY     CHOLECYSTECTOMY     IR ANGIO INTRA EXTRACRAN SEL INTERNAL CAROTID BILAT MOD SED  09/16/2016   IR ANGIO VERTEBRAL SEL VERTEBRAL BILAT MOD SED  09/16/2016   MASS EXCISION Left 02/10/2020   Procedure: excision of soft tissue mass left upper back;  Surgeon: Jesusita Oka, MD;  Location: [place];  Service: General;  Laterality: Left;    There were no vitals filed for this visit.   Subjective Assessment - 03/30/21 1121     Subjective  S: Granddaughter reports that pt's hand seemed swollen today and they have not worked on it yet.    Patient is accompanied by: Family member   granddaughter Merleen Nicely   Currently in Pain? No/denies                [facility] OT Assessment - 03/30/21 1122       Assessment   Medical Diagnosis s/p L Ischemic MCA CVA      Precautions   Precautions None                      OT Treatments/Exercises (OP) - 03/30/21 1122       ADLs   Home Maintenance Folded two  washcloths while standing using both hands focusing on right hand grasp and pinch.      Exercises   Exercises Wrist      Wrist Exercises   Wrist Flexion PROM;5 reps    Wrist Extension PROM;5 reps      Neurological Re-education Exercises   Finger Flexion P/ROM composite flinger flexion 5X    Other Grasp and Release Exercises  Saebo ring tree used to work on grasp and release as well as functional reaching. Completed standing. Removed all colored rings from vertical pole on base and placed each on a red horizontal level. Focused on lateral pinch, pronation and supation of forearm. Removed only 3 rings from horizontal red level and placed back on vertical pole at base.                      OT Short Term Goals - 03/01/21 2123       OT SHORT TERM GOAL #1   Title Pt will be provided with and educated on HEP to improve mobility of RUE as required for ADL completion.  Time 4    Period Weeks    Status On-going    Target Date 02/23/21      OT SHORT TERM GOAL #2   Title Pt will increase RUE strength to 4-/5 to improve ability to use RUE as assist when carrying items during ADLs.    Time 4    Period Weeks    Status Partially Met      OT SHORT TERM GOAL #3   Title Pt will demonstrate activation of wrist and digits required for a gross grasp on objects.    Time 4    Period Weeks      OT SHORT TERM GOAL #4   Title Pt will be educated on and demonstrate independence in NMR strategies to improve functional use of RUE.    Time 4    Period Weeks               OT Long Term Goals - 03/01/21 2124       OT LONG TERM GOAL #1   Title Pt will increase RUE strength to 4/5 or greater to improve ability to use RUE as active assist during ADLs.    Time 8    Period Weeks    Status On-going    Target Date 03/25/21      OT LONG TERM GOAL #2   Title Pt will demonstrate 25% or greater increase in A/ROM in RUE to improve ability to perform functional reaching tasks.    Time 8     Period Weeks      OT LONG TERM GOAL #3   Title Pt will demonstrate fine motor activation and control by completing 9 hole peg test or box and blocks test for coordination.    Time 8    Period Weeks    Status On-going      OT LONG TERM GOAL #4   Title Pt will improve grip strength to 10# or greater and pinch strength to 5# or greater to improve ability to grasp and hold items during ADL completion.    Time 8    Period Weeks    Status On-going                   Plan - 03/30/21 1135     Clinical Impression Statement A: Focused session on right hand lateral pinch with ring tree and colored rings. Did require moderate assist to complete task as instructed. To focus more on lateral pinch, OT did provide more physical assist with forearm supination and pronation guidance. OT supported colored rings to prevent movement which would prevent patient from successfully achieving a lateral pinch on rings. Required verbal, tactile, and visual cues to complete activities with proper form and technique.    Occupational performance deficits (Please refer to evaluation for details): --    Body Structure / Function / Physical Skills ADL;Endurance;UE functional use;ROM;Proprioception;IADL;Strength;Edema;Tone;Sensation;Coordination;FMC    Plan P: work on active wrist flexion and extension (attempt forearm on table if possible). Continue with lateral pinch work.    Consulted and Agree with Plan of Care Patient;Family member/caregiver    Family Member Consulted Granddaughter             Patient will benefit from skilled therapeutic intervention in order to improve the following deficits and impairments:   Body Structure / Function / Physical Skills: ADL, Endurance, UE functional use, ROM, Proprioception, IADL, Strength, Edema, Tone, Sensation, Coordination, [facility]       Visit Diagnosis: Other lack  of coordination  Other symptoms and signs involving the nervous system    Problem  List Patient Active Problem List   Diagnosis Date Noted   Slow transit constipation    Dyslipidemia    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

## 2021-04-02 ENCOUNTER — Encounter (HOSPITAL_COMMUNITY): Payer: Self-pay | Admitting: Occupational Therapy

## 2021-04-02 ENCOUNTER — Ambulatory Visit (HOSPITAL_COMMUNITY): Payer: Medicare Other | Admitting: Occupational Therapy

## 2021-04-02 ENCOUNTER — Other Ambulatory Visit: Payer: Self-pay

## 2021-04-02 DIAGNOSIS — R278 Other lack of coordination: Secondary | ICD-10-CM

## 2021-04-02 DIAGNOSIS — R29818 Other symptoms and signs involving the nervous system: Secondary | ICD-10-CM

## 2021-04-02 DIAGNOSIS — R4701 Aphasia: Secondary | ICD-10-CM | POA: Diagnosis not present

## 2021-04-02 NOTE — Therapy (Signed)
[place], [place], [address] Phone: [phone]   Fax:  [phone]  Occupational Therapy Treatment  Patient Details  Name: Natasha Vazquez MRN: [id number] Date of Birth: 1950/06/07 Referring Provider (OT): Lauraine Rinne, PA-C   Encounter Date: 04/02/2021   OT End of Session - 04/02/21 1525     Visit Number 17    Number of Visits 24    Date for OT Re-Evaluation 04/23/21    Authorization Type 1) UHC Medicare 2) Medicaid    Authorization Time Period no visit limit    Progress Note Due on Visit 25    OT Start Time 1300    OT Stop Time 1340    OT Time Calculation (min) 40 min    Activity Tolerance Patient tolerated treatment well    Behavior During Therapy [facility] for tasks assessed/performed             Past Medical History:  Diagnosis Date   Hypertension    TMJ (dislocation of temporomandibular joint)     Past Surgical History:  Procedure Laterality Date   ABDOMINAL HYSTERECTOMY     CHOLECYSTECTOMY     IR ANGIO INTRA EXTRACRAN SEL INTERNAL CAROTID BILAT MOD SED  09/16/2016   IR ANGIO VERTEBRAL SEL VERTEBRAL BILAT MOD SED  09/16/2016   MASS EXCISION Left 02/10/2020   Procedure: excision of soft tissue mass left upper back;  Surgeon: Jesusita Oka, MD;  Location: [place];  Service: General;  Laterality: Left;    There were no vitals filed for this visit.   Subjective Assessment - 04/02/21 1300     Subjective  S: Granddaughter reports her hand continues to be somewhat tight.    Currently in Pain? No/denies                [facility] OT Assessment - 04/02/21 1300       Assessment   Medical Diagnosis s/p L Ischemic MCA CVA      Precautions   Precautions None                      OT Treatments/Exercises (OP) - 04/02/21 1318       Exercises   Exercises Wrist      Wrist Exercises   Wrist Flexion PROM;5 reps;AROM;10 reps    Wrist Extension PROM;5 reps;AROM;10  reps      Additional Wrist Exercises   Sponges Pt using hand gripper at 7# to grasp and move 5 sponges, holding horizontally. OT stabilizing at end of gripper and forearm.      Neurological Re-education Exercises   Shoulder Flexion AAROM;10 reps    Shoulder Protraction AAROM;10 reps    Forearm Supination PROM;5 reps;AROM;10 reps    Forearm Pronation AROM;10 reps    Finger Flexion P/ROM composite flinger flexion 5X    Finger Extension composite digit flexion/extension 5X, A/ROM    Other Grasp and Release Exercises  Pt reaching for jenga blocks positioned on edge of table, using lateral pinch to grasp and pick up. Picked up 10 blocks, OT providing min assist for stabilizing blocks during pick up.    Reciprocal Movements UBE: level 1, 3' forward pace at 3.0 with OT stabilizing at elbow                      OT Short Term Goals - 03/01/21 2123       OT SHORT TERM GOAL #1  Title Pt will be provided with and educated on HEP to improve mobility of RUE as required for ADL completion.    Time 4    Period Weeks    Status On-going    Target Date 02/23/21      OT SHORT TERM GOAL #2   Title Pt will increase RUE strength to 4-/5 to improve ability to use RUE as assist when carrying items during ADLs.    Time 4    Period Weeks    Status Partially Met      OT SHORT TERM GOAL #3   Title Pt will demonstrate activation of wrist and digits required for a gross grasp on objects.    Time 4    Period Weeks      OT SHORT TERM GOAL #4   Title Pt will be educated on and demonstrate independence in NMR strategies to improve functional use of RUE.    Time 4    Period Weeks               OT Long Term Goals - 03/01/21 2124       OT LONG TERM GOAL #1   Title Pt will increase RUE strength to 4/5 or greater to improve ability to use RUE as active assist during ADLs.    Time 8    Period Weeks    Status On-going    Target Date 03/25/21      OT LONG TERM GOAL #2   Title Pt will  demonstrate 25% or greater increase in A/ROM in RUE to improve ability to perform functional reaching tasks.    Time 8    Period Weeks      OT LONG TERM GOAL #3   Title Pt will demonstrate fine motor activation and control by completing 9 hole peg test or box and blocks test for coordination.    Time 8    Period Weeks    Status On-going      OT LONG TERM GOAL #4   Title Pt will improve grip strength to 10# or greater and pinch strength to 5# or greater to improve ability to grasp and hold items during ADL completion.    Time 8    Period Weeks    Status On-going                   Plan - 04/02/21 1525     Clinical Impression Statement A: Session focusing on functional grasp and lateral pinch. Pt completing A/ROM for wrist and forearm at beginning of session, AA/ROM for shoulder protraction and flexion. Pt with volitional thumb abduction today required for lateral pinch, increased time and easily fatigues however continues to improve. Added hand gripper task today and pt able to complete on lowest setting with min assist from OT. Verbal and visual cuing for form and technique during tasks.    Body Structure / Function / Physical Skills ADL;Endurance;UE functional use;ROM;Proprioception;IADL;Strength;Edema;Tone;Sensation;Coordination;FMC    Plan P: Continue with RUE A/ROM and AA/ROM, Continue with lateral pinch work and functional grasp tasks    OT Home Exercise Plan eval: weightbearing; 1/3: shoulder and elbow AA/ROM 1/12: P/ROM fingers/thumb. Ball roll wrist flexion/extension; 1/25: wrist and forearm A/ROM    Consulted and Agree with Plan of Care Patient;Family member/caregiver    Family Member Consulted Granddaughter             Patient will benefit from skilled therapeutic intervention in order to improve the following deficits and impairments:  Body Structure / Function / Physical Skills: ADL, Endurance, UE functional use, ROM, Proprioception, IADL, Strength, Edema, Tone,  Sensation, Coordination, [facility]       Visit Diagnosis: Other lack of coordination  Other symptoms and signs involving the nervous system    Problem List Patient Active Problem List   Diagnosis Date Noted   Slow transit constipation    Dyslipidemia    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

## 2021-04-05 ENCOUNTER — Ambulatory Visit (HOSPITAL_COMMUNITY): Payer: Medicare Other | Attending: Physician Assistant | Admitting: Occupational Therapy

## 2021-04-05 ENCOUNTER — Encounter (HOSPITAL_COMMUNITY): Payer: Self-pay | Admitting: Occupational Therapy

## 2021-04-05 ENCOUNTER — Other Ambulatory Visit: Payer: Self-pay

## 2021-04-05 DIAGNOSIS — R29818 Other symptoms and signs involving the nervous system: Secondary | ICD-10-CM | POA: Insufficient documentation

## 2021-04-05 DIAGNOSIS — R278 Other lack of coordination: Secondary | ICD-10-CM | POA: Insufficient documentation

## 2021-04-05 NOTE — Therapy (Addendum)
[place] ?[place] ?[address] ?[place], [place], [address] ?Phone: [phone]   Fax:  [phone] ? ?Occupational Therapy Treatment ? ?Patient Details  ?Name: Natasha Vazquez ?MRN: [id number] ?Date of Birth: 09/11/1950 ?Referring Provider (OT): Lauraine Rinne, PA-C ? ? ?Encounter Date: 04/05/2021 ? ? OT End of Session - 04/05/21 1150   ? ? Visit Number 18   ? Number of Visits 24   ? Date for OT Re-Evaluation 04/23/21   ? Authorization Type 1) UHC Medicare 2) Medicaid   ? Authorization Time Period no visit limit   ? Progress Note Due on Visit 25   ? OT Start Time 1035   ? OT Stop Time 1114   ? OT Time Calculation (min) 39 min   ? Activity Tolerance Patient tolerated treatment well   ? Behavior During Therapy [facility] for tasks assessed/performed   ? ?  ?  ? ?  ? ? ?Past Medical History:  ?Diagnosis Date  ? Hypertension   ? TMJ (dislocation of temporomandibular joint)   ? ? ?Past Surgical History:  ?Procedure Laterality Date  ? ABDOMINAL HYSTERECTOMY    ? CHOLECYSTECTOMY    ? IR ANGIO INTRA EXTRACRAN SEL INTERNAL CAROTID BILAT MOD SED  09/16/2016  ? IR ANGIO VERTEBRAL SEL VERTEBRAL BILAT MOD SED  09/16/2016  ? MASS EXCISION Left 02/10/2020  ? Procedure: excision of soft tissue mass left upper back;  Surgeon: Jesusita Oka, MD;  Location: [place];  Service: General;  Laterality: Left;  ? ? ?There were no vitals filed for this visit. ? ? ? 04/05/21 1035  ?Symptoms/Limitations  ?Subjective  S: Lord have mercy. (during new reaching activity and keeping feet still)  ?Pain Assessment  ?Currently in Pain? No/denies  ? ? ? ? ? ? ? ? ? ? ? ? ? ? ? ? OT Treatments/Exercises (OP) - 04/05/21 1144   ? ?  ? Exercises  ? Exercises Wrist   ?  ? Wrist Exercises  ? Wrist Flexion PROM;5 reps;AROM;10 reps   ? Wrist Extension PROM;5 reps;AROM;10 reps   ?  ? Additional Wrist Exercises  ? Hand Gripper with Large Beads all beads gripper at 7#, completed horizontal and vertical, OT providing  assist to stablize gripper and pt squeezing   ?  ? Neurological Re-education Exercises  ? Forearm Supination PROM;5 reps;AROM;10 reps   ? Forearm Pronation AROM;10 reps   ? Finger Flexion P/ROM composite flinger flexion 5X   ? Finger Extension composite digit flexion/extension 5X, A/ROM   ? Development of Reach Saebo   ? Saebo Ring Tree Pt standing at tabletop with Ring Tree on table. OT holding balls and pt reaching for ball, grasping between fingers and thumb, then reaching to place on red bar and slide across. Completed for 5 balls. Then switched to rings, OT assisting with set-up to pinch rings between fingers, then pt reaching to slide onto green bar. Pt standing approximately 2 feet from table, OT cuing with hands at hips to prevent leaning too far forward so pt can focus on shoulder flexion during task.   ?  ? Functional Reaching Activities  ? Mid Level Pt reaching to bottom shelf of kitchen cabinet to grasp boxes or bottles and bring down to countertop. OT assisting with stabilizing items for pt to grasp. Increased time for grasping.   ? ?  ?  ? ?  ? ? ? ? ? ? ? ? ? ? ? OT Short  Term Goals - 03/01/21 2123   ? ?  ? OT SHORT TERM GOAL #1  ? Title Pt will be provided with and educated on HEP to improve mobility of RUE as required for ADL completion.   ? Time 4   ? Period Weeks   ? Status On-going   ? Target Date 02/23/21   ?  ? OT SHORT TERM GOAL #2  ? Title Pt will increase RUE strength to 4-/5 to improve ability to use RUE as assist when carrying items during ADLs.   ? Time 4   ? Period Weeks   ? Status Partially Met   ?  ? OT SHORT TERM GOAL #3  ? Title Pt will demonstrate activation of wrist and digits required for a gross grasp on objects.   ? Time 4   ? Period Weeks   ?  ? OT SHORT TERM GOAL #4  ? Title Pt will be educated on and demonstrate independence in NMR strategies to improve functional use of RUE.   ? Time 4   ? Period Weeks   ? ?  ?  ? ?  ? ? ? ? OT Long Term Goals - 03/01/21 2124   ? ?  ? OT  LONG TERM GOAL #1  ? Title Pt will increase RUE strength to 4/5 or greater to improve ability to use RUE as active assist during ADLs.   ? Time 8   ? Period Weeks   ? Status On-going   ? Target Date 03/25/21   ?  ? OT LONG TERM GOAL #2  ? Title Pt will demonstrate 25% or greater increase in A/ROM in RUE to improve ability to perform functional reaching tasks.   ? Time 8   ? Period Weeks   ?  ? OT LONG TERM GOAL #3  ? Title Pt will demonstrate fine motor activation and control by completing 9 hole peg test or box and blocks test for coordination.   ? Time 8   ? Period Weeks   ? Status On-going   ?  ? OT LONG TERM GOAL #4  ? Title Pt will improve grip strength to 10# or greater and pinch strength to 5# or greater to improve ability to grasp and hold items during ADL completion.   ? Time 8   ? Period Weeks   ? Status On-going   ? ?  ?  ? ?  ? ? ? ? ? ? ? ? Plan - 04/05/21 1150   ? ? Clinical Impression Statement A: Session focusing on functional reaching and grasp. Pt standing approximately 1-2 feet from table for saebo tree activity, OT cuing at hips to not step forward or lean too far forward when placing items on tree. Pt reaching to chest and shoulder height (red and green bars) for task. Improvement in independence with hand gripper today, OT stabilizing and occasionally providing min assist. Also completing functional reaching to cabinet, pt working to actively abduct and flex thumb for grasping.   ? Body Structure / Function / Physical Skills ADL;Endurance;UE functional use;ROM;Proprioception;IADL;Strength;Edema;Tone;Sensation;Coordination;FMC   ? Plan P: Continue with RUE A/ROM and AA/ROM, Continue with lateral pinch work and functional grasp tasks   ? OT Home Exercise Plan eval: weightbearing; 1/3: shoulder and elbow AA/ROM 1/12: P/ROM fingers/thumb. Ball roll wrist flexion/extension; 1/25: wrist and forearm A/ROM   ? Consulted and Agree with Plan of Care Patient;Family member/caregiver   ? Family Member  Consulted Granddaughter   ? ?  ?  ? ?  ? ? ?  Patient will benefit from skilled therapeutic intervention in order to improve the following deficits and impairments:   ?Body Structure / Function / Physical Skills: ADL, Endurance, UE functional use, ROM, Proprioception, IADL, Strength, Edema, Tone, Sensation, Coordination, FMC ?  ?  ? ? ?Visit Diagnosis: ?Other lack of coordination ? ?Other symptoms and signs involving the nervous system ? ? ? ?Problem List ?Patient Active Problem List  ? Diagnosis Date Noted  ? Slow transit constipation   ? Dyslipidemia   ? 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 Chronic heart failure with preserved ejection fraction (HFpEF) ([place]) 01/02/2021  ? Acute ischemic stroke ([place]) 12/26/2020  ? Essential hypertension 12/26/2020  ? Mixed hyperlipidemia 12/26/2020  ? Acute ischemic left MCA stroke ([place]) 12/26/2020  ? ? ?Guadelupe Sabin, OTR/L  ?[phone] ?04/05/2021, 11:53 AM ? ?[place] ?[place] ?[address] ?[place], [place], [address] ?Phone: [phone]   Fax:  [phone] ? ?Name: RASHIYA LOFLAND ?MRN: [id number] ?Date of Birth: 04-Sep-1950 ? ?

## 2021-04-09 ENCOUNTER — Other Ambulatory Visit: Payer: Self-pay

## 2021-04-09 ENCOUNTER — Ambulatory Visit (HOSPITAL_COMMUNITY): Payer: Medicare Other

## 2021-04-09 ENCOUNTER — Encounter (HOSPITAL_COMMUNITY): Payer: Self-pay

## 2021-04-09 DIAGNOSIS — R29818 Other symptoms and signs involving the nervous system: Secondary | ICD-10-CM

## 2021-04-09 DIAGNOSIS — R278 Other lack of coordination: Secondary | ICD-10-CM

## 2021-04-10 NOTE — Therapy (Signed)
[place] ?[place] ?[address] ?[place], [place], [address] ?Phone: [phone]   Fax:  [phone] ? ?Occupational Therapy Treatment ? ?Patient Details  ?Name: Natasha Vazquez ?MRN: [id number] ?Date of Birth: [birth year] ?Referring Provider (OT): Lauraine Rinne, PA-C ? ? ?Encounter Date: 04/09/2021 ? ? OT End of Session - 04/10/21 1030   ? ? Visit Number 19   ? Number of Visits 24   ? Date for OT Re-Evaluation 04/23/21   ? Authorization Type 1) UHC Medicare 2) Medicaid   ? Authorization Time Period no visit limit   ? Progress Note Due on Visit 25   ? OT Start Time 1300   ? OT Stop Time 1340   ? OT Time Calculation (min) 40 min   ? Activity Tolerance Patient tolerated treatment well   ? Behavior During Therapy [facility] for tasks assessed/performed   ? ?  ?  ? ?  ? ? ?Past Medical History:  ?Diagnosis Date  ? Hypertension   ? TMJ (dislocation of temporomandibular joint)   ? ? ?Past Surgical History:  ?Procedure Laterality Date  ? ABDOMINAL HYSTERECTOMY    ? CHOLECYSTECTOMY    ? IR ANGIO INTRA EXTRACRAN SEL INTERNAL CAROTID BILAT MOD SED  09/16/2016  ? IR ANGIO VERTEBRAL SEL VERTEBRAL BILAT MOD SED  09/16/2016  ? MASS EXCISION Left 02/10/2020  ? Procedure: excision of soft tissue mass left upper back;  Surgeon: Jesusita Oka, MD;  Location: [facility];  Service: General;  Laterality: Left;  ? ? ?There were no vitals filed for this visit. ? ? Subjective Assessment - 04/09/21 1320   ? ? Subjective  S: Brought person NMES unit and requesting instruction on how to use it.   ? Patient is accompanied by: Family member   Granddaughter  ? Currently in Pain? No/denies   ? ?  ?  ? ?  ? ? ? ? ? [place] OT Assessment - 04/09/21 1320   ? ?  ? Assessment  ? Medical Diagnosis s/p L Ischemic MCA CVA   ?  ? Precautions  ? Precautions None   ? ?  ?  ? ?  ? ? ? ? ? ? ? ? ? ? ? OT Treatments/Exercises (OP) - 04/09/21 1321   ? ?  ? Exercises  ? Exercises Wrist;Hand   ?  ? Additional Wrist Exercises   ? Sponges Sponges; 12; used 2 point pinch to transfer each sponge from table to top of container.   ?  ? Neurological Re-education Exercises  ? Finger Flexion P/ROM composite flinger flexion 5X   ? Finger Extension composite digit flexion/extension 5X, A/ROM   ? Thumb Opposition P/ROM 5X   ? Other Exercises 1 functional 2 finger point pinch used while transfering high resistance sponges from table to container lid.   ? ?  ?  ? ?  ? ? ? ? ? ? ? ? ? OT Education - 04/10/21 1014   ? ? Education Details Education completed regarding use of personal NMES unit with focus on wrist and hand activation.   ? Person(s) Educated Patient;Other (comment)   Granddaughter  ? Methods Explanation;Demonstration   ? Comprehension Verbalized understanding   ? ?  ?  ? ?  ? ? ? OT Short Term Goals - 03/01/21 2123   ? ?  ? OT SHORT TERM GOAL #1  ? Title Pt will be provided with and educated on HEP to improve mobility of RUE  as required for ADL completion.   ? Time 4   ? Period Weeks   ? Status On-going   ? Target Date 02/23/21   ?  ? OT SHORT TERM GOAL #2  ? Title Pt will increase RUE strength to 4-/5 to improve ability to use RUE as assist when carrying items during ADLs.   ? Time 4   ? Period Weeks   ? Status Partially Met   ?  ? OT SHORT TERM GOAL #3  ? Title Pt will demonstrate activation of wrist and digits required for a gross grasp on objects.   ? Time 4   ? Period Weeks   ?  ? OT SHORT TERM GOAL #4  ? Title Pt will be educated on and demonstrate independence in NMR strategies to improve functional use of RUE.   ? Time 4   ? Period Weeks   ? ?  ?  ? ?  ? ? ? ? OT Long Term Goals - 03/01/21 2124   ? ?  ? OT LONG TERM GOAL #1  ? Title Pt will increase RUE strength to 4/5 or greater to improve ability to use RUE as active assist during ADLs.   ? Time 8   ? Period Weeks   ? Status On-going   ? Target Date 03/25/21   ?  ? OT LONG TERM GOAL #2  ? Title Pt will demonstrate 25% or greater increase in A/ROM in RUE to improve ability to  perform functional reaching tasks.   ? Time 8   ? Period Weeks   ?  ? OT LONG TERM GOAL #3  ? Title Pt will demonstrate fine motor activation and control by completing 9 hole peg test or box and blocks test for coordination.   ? Time 8   ? Period Weeks   ? Status On-going   ?  ? OT LONG TERM GOAL #4  ? Title Pt will improve grip strength to 10# or greater and pinch strength to 5# or greater to improve ability to grasp and hold items during ADL completion.   ? Time 8   ? Period Weeks   ? Status On-going   ? ?  ?  ? ?  ? ? ? ? ? ? ? ? Plan - 04/10/21 1030   ? ? Clinical Impression Statement A: Attempted hand strengthening task using handgripper at 7# and was unable to achieve needed strength to squeeze and pick up sponge, Also tried using long black kitchen tongs with coban handle and was unable to achieve needed strength to squeeze sponge. Focused on 2 point pinch and lateral pinch to transfer sponges. VC for form and technique were provided.   ? Body Structure / Function / Physical Skills ADL;Endurance;UE functional use;ROM;Proprioception;IADL;Strength;Edema;Tone;Sensation;Coordination;FMC   ? Plan P: Ask if she has been practicing taking her glasses off at home with her right hand. Continue with RUE A/ROM and AA/ROM, Continue with lateral pinch work and functional grasp tasks   ? Consulted and Agree with Plan of Care Patient;Family member/caregiver   ? Family Member Consulted Granddaughter   ? ?  ?  ? ?  ? ? ?Patient will benefit from skilled therapeutic intervention in order to improve the following deficits and impairments:   ?Body Structure / Function / Physical Skills: ADL, Endurance, UE functional use, ROM, Proprioception, IADL, Strength, Edema, Tone, Sensation, Coordination, FMC ?  ?  ? ? ?Visit Diagnosis: ?Other lack of coordination ? ?Other symptoms and  signs involving the nervous system ? ? ? ?Problem List ?Patient Active Problem List  ? Diagnosis Date Noted  ? Slow transit constipation   ? Dyslipidemia    ? 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 Chronic heart failure with preserved ejection fraction (HFpEF) ([place]) 01/02/2021  ? Acute ischemic stroke ([place]) 12/26/2020  ? Essential hypertension 12/26/2020  ? Mixed hyperlipidemia 12/26/2020  ? Acute ischemic left MCA stroke ([place]) 12/26/2020  ? ?Ailene Ravel, OTR/L,CBIS  ?[phone] ? ?04/10/2021, 10:34 AM ? ?[place] ?[place] ?[address] ?[place], [place], [address] ?Phone: [phone]   Fax:  [phone] ? ?Name: Natasha Vazquez ?MRN: [id number] ?Date of Birth: 08/17/1950 ? ?

## 2021-04-11 ENCOUNTER — Ambulatory Visit (HOSPITAL_COMMUNITY): Payer: Medicare Other

## 2021-04-11 ENCOUNTER — Encounter (HOSPITAL_COMMUNITY): Payer: Self-pay

## 2021-04-11 ENCOUNTER — Other Ambulatory Visit: Payer: Self-pay

## 2021-04-11 DIAGNOSIS — R278 Other lack of coordination: Secondary | ICD-10-CM | POA: Diagnosis not present

## 2021-04-11 DIAGNOSIS — R29818 Other symptoms and signs involving the nervous system: Secondary | ICD-10-CM | POA: Diagnosis not present

## 2021-04-11 NOTE — Therapy (Signed)
[place] ?[place] ?[address] ?[place], [place], [address] ?Phone: [phone]   Fax:  [phone] ? ?Occupational Therapy Treatment ? ?Patient Details  ?Name: Natasha Vazquez ?MRN: [id number] ?Date of Birth: [birth year] ?Referring Provider (OT): Lauraine Rinne, PA-C ? ? ?Encounter Date: 04/11/2021 ? ? OT End of Session - 04/11/21 1138   ? ? Visit Number 20   ? Number of Visits 24   ? Date for OT Re-Evaluation 04/23/21   ? Authorization Type 1) UHC Medicare 2) Medicaid   ? Authorization Time Period no visit limit   ? Progress Note Due on Visit 25   ? OT Start Time 1115   ? OT Stop Time 1153   ? OT Time Calculation (min) 38 min   ? Activity Tolerance Patient tolerated treatment well   ? Behavior During Therapy [facility] for tasks assessed/performed   ? ?  ?  ? ?  ? ? ?Past Medical History:  ?Diagnosis Date  ? Hypertension   ? TMJ (dislocation of temporomandibular joint)   ? ? ?Past Surgical History:  ?Procedure Laterality Date  ? ABDOMINAL HYSTERECTOMY    ? CHOLECYSTECTOMY    ? IR ANGIO INTRA EXTRACRAN SEL INTERNAL CAROTID BILAT MOD SED  09/16/2016  ? IR ANGIO VERTEBRAL SEL VERTEBRAL BILAT MOD SED  09/16/2016  ? MASS EXCISION Left 02/10/2020  ? Procedure: excision of soft tissue mass left upper back;  Surgeon: Jesusita Oka, MD;  Location: [place] [facility];  Service: General;  Laterality: Left;  ? ? ?There were no vitals filed for this visit. ? ? Subjective Assessment - 04/11/21 1131   ? ? Subjective  S: Communicates that arm is sore. From hand to elbow. Started yesterday.   ? Patient is accompanied by: Family member   granddaughter  ? Currently in Pain? Yes   no number given. see subjective regarding pain  ? ?  ?  ? ?  ? ? ? ? ? [facility] OT Assessment - 04/11/21 1133   ? ?  ? Assessment  ? Medical Diagnosis s/p L Ischemic MCA CVA   ?  ? Precautions  ? Precautions None   ? ?  ?  ? ?  ? ? ? ? ? ? ? ? ? ? ? OT Treatments/Exercises (OP) - 04/11/21 1133   ? ?  ? ADLs  ? UB Dressing  Removed glasses from face using right hand 5X   ?  ? Exercises  ? Exercises Wrist;Hand   ?  ? Additional Wrist Exercises  ? Sponges 2 point pinch (thumb and index), 5 stacks of 2 sponges (soft). Moved from tabletop to top of container to stack.   ?  ? Neurological Re-education Exercises  ? Shoulder Flexion --   ? Shoulder Protraction --   ? Other Exercises 1 Supine; A/ROM with therapist providing very limited guidance for movement. alternating between A/ROM and AA/ROM, shoulder protraction, er, flexion, 5X   ?  ? Manual Therapy  ? Manual Therapy Other (comment)   ? Manual therapy comments manual therapy completed separate from exercises.   ? Other Manual Therapy Soft tissue and joint mobilization completed to right shoulder, upper trapezius, and anterior shoulder.   ? ?  ?  ? ?  ? ? ? ? ? ? ? ? ? ? ? OT Short Term Goals - 03/01/21 2123   ? ?  ? OT SHORT TERM GOAL #1  ? Title Pt will be provided with and educated  on HEP to improve mobility of RUE as required for ADL completion.   ? Time 4   ? Period Weeks   ? Status On-going   ? Target Date 02/23/21   ?  ? OT SHORT TERM GOAL #2  ? Title Pt will increase RUE strength to 4-/5 to improve ability to use RUE as assist when carrying items during ADLs.   ? Time 4   ? Period Weeks   ? Status Partially Met   ?  ? OT SHORT TERM GOAL #3  ? Title Pt will demonstrate activation of wrist and digits required for a gross grasp on objects.   ? Time 4   ? Period Weeks   ?  ? OT SHORT TERM GOAL #4  ? Title Pt will be educated on and demonstrate independence in NMR strategies to improve functional use of RUE.   ? Time 4   ? Period Weeks   ? ?  ?  ? ?  ? ? ? ? OT Long Term Goals - 03/01/21 2124   ? ?  ? OT LONG TERM GOAL #1  ? Title Pt will increase RUE strength to 4/5 or greater to improve ability to use RUE as active assist during ADLs.   ? Time 8   ? Period Weeks   ? Status On-going   ? Target Date 03/25/21   ?  ? OT LONG TERM GOAL #2  ? Title Pt will demonstrate 25% or greater  increase in A/ROM in RUE to improve ability to perform functional reaching tasks.   ? Time 8   ? Period Weeks   ?  ? OT LONG TERM GOAL #3  ? Title Pt will demonstrate fine motor activation and control by completing 9 hole peg test or box and blocks test for coordination.   ? Time 8   ? Period Weeks   ? Status On-going   ?  ? OT LONG TERM GOAL #4  ? Title Pt will improve grip strength to 10# or greater and pinch strength to 5# or greater to improve ability to grasp and hold items during ADL completion.   ? Time 8   ? Period Weeks   ? Status On-going   ? ?  ?  ? ?  ? ? ? ? ? ? ? ? Plan - 04/11/21 1139   ? ? Clinical Impression Statement A: Max difficulty with sponge stacking task although completed with verbal cues for technique, cues for rest breaks, and form. Attempted grasp/grip task with right hand Large kitchen and small tongs used; both with coban to help with grip although unable to successfully complete task. Due to reports of right arm soreness/discomfort, session was ended with manual techniques, joint mobilitzation and ROM exercises. Pt reports no discomfort in arm after techniques were complete.   ? Body Structure / Function / Physical Skills ADL;Endurance;UE functional use;ROM;Proprioception;IADL;Strength;Edema;Tone;Sensation;Coordination;FMC   ? Plan P: Continue with A/ROM and AA/ROM. Lateral pinch and 2 point pinch work, functional grasp tasks.   ? Consulted and Agree with Plan of Care Patient;Family member/caregiver   ? Family Member Consulted Granddaughter   ? ?  ?  ? ?  ? ? ?Patient will benefit from skilled therapeutic intervention in order to improve the following deficits and impairments:   ?Body Structure / Function / Physical Skills: ADL, Endurance, UE functional use, ROM, Proprioception, IADL, Strength, Edema, Tone, Sensation, Coordination, FMC ?  ?  ? ? ?Visit Diagnosis: ?Other lack of coordination ? ?  Other symptoms and signs involving the nervous system ? ? ? ?Problem List ?Patient Active  Problem List  ? Diagnosis Date Noted  ? Slow transit constipation   ? Dyslipidemia   ? 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 Chronic heart failure with preserved ejection fraction (HFpEF) ([place]) 01/02/2021  ? Acute ischemic stroke ([place]) 12/26/2020  ? Essential hypertension 12/26/2020  ? Mixed hyperlipidemia 12/26/2020  ? Acute ischemic left MCA stroke ([place]) 12/26/2020  ? ?Ailene Ravel, OTR/L,CBIS  ?[phone] ? ?04/11/2021, 12:43 PM ? ?[place] ?[place] ?[address] ?[place], [place], [address] ?Phone: [phone]   Fax:  [phone] ? ?Name: Natasha Vazquez ?MRN: [id number] ?Date of Birth: 1950/02/10 ? ?

## 2021-04-16 ENCOUNTER — Ambulatory Visit (HOSPITAL_COMMUNITY): Payer: Medicare Other | Admitting: Occupational Therapy

## 2021-04-16 ENCOUNTER — Other Ambulatory Visit: Payer: Self-pay

## 2021-04-16 ENCOUNTER — Encounter (HOSPITAL_COMMUNITY): Payer: Self-pay | Admitting: Occupational Therapy

## 2021-04-16 DIAGNOSIS — R29818 Other symptoms and signs involving the nervous system: Secondary | ICD-10-CM | POA: Diagnosis not present

## 2021-04-16 DIAGNOSIS — R278 Other lack of coordination: Secondary | ICD-10-CM | POA: Diagnosis not present

## 2021-04-16 NOTE — Therapy (Signed)
?OUTPATIENT OCCUPATIONAL THERAPY TREATMENT NOTE ? ? ?Patient Name: JERICHA BRYDEN ?MRN: [id number] ?DOB:Mar 03, 1950, 71 y.o., female ?Today's Date: 04/16/2021 ? ?PCP: Carol Ada, MD ?REFERRING PROVIDER: Lauraine Rinne, PA-C ? ? OT End of Session - 04/16/21 1204   ? ? Visit Number 21   ? Number of Visits 24   ? Date for OT Re-Evaluation 04/23/21   ? Authorization Type 1) U[employer] Medicare 2) Medicaid   ? Authorization Time Period no visit limit   ? Progress Note Due on Visit 25   ? OT Start Time 1123   ? OT Stop Time 1204   ? OT Time Calculation (min) 41 min   ? Activity Tolerance Patient tolerated treatment well   ? Behavior During Therapy [facility] for tasks assessed/performed   ? ?  ?  ? ?  ? ? ?Past Medical History:  ?Diagnosis Date  ? Hypertension   ? TMJ (dislocation of temporomandibular joint)   ? ?Past Surgical History:  ?Procedure Laterality Date  ? ABDOMINAL HYSTERECTOMY    ? CHOLECYSTECTOMY    ? IR ANGIO INTRA EXTRACRAN SEL INTERNAL CAROTID BILAT MOD SED  09/16/2016  ? IR ANGIO VERTEBRAL SEL VERTEBRAL BILAT MOD SED  09/16/2016  ? MASS EXCISION Left 02/10/2020  ? Procedure: excision of soft tissue mass left upper back;  Surgeon: Jesusita Oka, MD;  Location: [facility];  Service: General;  Laterality: Left;  ? ?Patient Active Problem List  ? Diagnosis Date Noted  ? Slow transit constipation   ? Dyslipidemia   ? 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 Chronic heart failure with preserved ejection fraction (HFpEF) ([place]) 01/02/2021  ? Acute ischemic stroke ([place]) 12/26/2020  ? Essential hypertension 12/26/2020  ? Mixed hyperlipidemia 12/26/2020  ? Acute ischemic left MCA stroke ([place]) 12/26/2020  ? ? ?ONSET DATE: 12/25/20 ? ?REFERRING DIAG: s/p CVA ? ?THERAPY DIAG:  ?Other lack of coordination ? ?Other symptoms and signs involving the nervous system ? ? ?PERTINENT HISTORY: Pt is a 71 y/o female s/p left MCA CVA on 12/25/20 presenting with right hemiplegia and decreased functional use of  dominant RUE for ADLs.  ? ?PRECAUTIONS: None ? ?SUBJECTIVE: S: Oh yes. (Indicating she did like the manual techniques trialed last session) ? ?PAIN:  ?Are you having pain? No ? ? ? ? ?OBJECTIVE:  ? ?TODAY'S TREATMENT:  ?-Myofascial release: completed separately from therapeutic exercises. Soft tissue and joint mobilization completed to right shoulder, upper trapezius, and anterior shoulder.  ?-Supine; A/ROM with therapist providing very limited guidance for movement. Shoulder protraction, er, flexion, 5X  ?-Functional Reaching: Pt removing 10 large pegs from pegboard attached to standing mirror, OT cuing for keeping feet still and not leaning by placing hands at pt hips. Pt then placing 5 double pegs (2 pegs put together) along bottom of pegboard at approximately waist height. Pt requiring mod assist for set-up and lining up with hole.  ?-Functional Reaching: standing at kitchen cabinet, pt removing and replacing items from bottom shelf. OT stabilizing items for pt to work on opening thumb and index finger for pinching.  ?-UBE: level 1, 2' forward, OT stabilizing at elbow and at hand as needed, pace: 3.0 ? ? ? ? OT Short Term Goals   ? ?  ? OT SHORT TERM GOAL #1  ? Title Pt will be provided with and educated on HEP to improve mobility of RUE as required for ADL completion.   ? Time 4   ? Period Weeks   ?  Status On-going   ? Target Date 02/23/21   ?  ? OT SHORT TERM GOAL #2  ? Title Pt will increase RUE strength to 4-/5 to improve ability to use RUE as assist when carrying items during ADLs.   ? Time 4   ? Period Weeks   ? Status Partially Met   ?  ? OT SHORT TERM GOAL #3  ? Title Pt will demonstrate activation of wrist and digits required for a gross grasp on objects.   ? Time 4   ? Period Weeks   ?  ? OT SHORT TERM GOAL #4  ? Title Pt will be educated on and demonstrate independence in NMR strategies to improve functional use of RUE.   ? Time 4   ? Period Weeks   ? ?  ?  ? ?  ? ? ? OT Long Term Goals   ? ?  ? OT  LONG TERM GOAL #1  ? Title Pt will increase RUE strength to 4/5 or greater to improve ability to use RUE as active assist during ADLs.   ? Time 8   ? Period Weeks   ? Status On-going   ? Target Date 03/25/21   ?  ? OT LONG TERM GOAL #2  ? Title Pt will demonstrate 25% or greater increase in A/ROM in RUE to improve ability to perform functional reaching tasks.   ? Time 8   ? Period Weeks   ?  ? OT LONG TERM GOAL #3  ? Title Pt will demonstrate fine motor activation and control by completing 9 hole peg test or box and blocks test for coordination.   ? Time 8   ? Period Weeks   ? Status On-going   ?  ? OT LONG TERM GOAL #4  ? Title Pt will improve grip strength to 10# or greater and pinch strength to 5# or greater to improve ability to grasp and hold items during ADL completion.   ? Time 8   ? Period Weeks   ? Status On-going   ? ?  ?  ? ?  ? ? ? Plan - 04/16/21 1204   ? ? Clinical Impression Statement A: Pt and granddaughter reports she really felt improvement in her RUE soreness after last session, therefore continued with myofascial release this session. Session focusing on functional reaching and grasp activities. Pt does demonstrate an increase in tone in right hand limiting grasp and active digit mobility compared to this therapists last session with pt. Increased time required for grasp and functional reaching. Verbal and visual cuing for form and technique.   ? Body Structure / Function / Physical Skills ADL;Endurance;UE functional use;ROM;Proprioception;IADL;Strength;Edema;Tone;Sensation;Coordination;FMC   ? Plan P: Continue with A/ROM and AA/ROM. Lateral pinch and 2 point pinch work, functional grasp tasks.   ? OT Home Exercise Plan eval: weightbearing; 1/3: shoulder and elbow AA/ROM 1/12: P/ROM fingers/thumb. Ball roll wrist flexion/extension; 1/25: wrist and forearm A/ROM   ? Consulted and Agree with Plan of Care Patient;Family member/caregiver   ? Family Member Consulted Granddaughter   ? ?  ?  ? ?   ? ? ? ?Guadelupe Sabin, OTR/L  ?[phone] ?04/16/2021, 12:08 PM ? ?  ? ? ? ?

## 2021-04-18 ENCOUNTER — Ambulatory Visit (HOSPITAL_COMMUNITY): Payer: Medicare Other

## 2021-04-18 ENCOUNTER — Encounter (HOSPITAL_COMMUNITY): Payer: Self-pay

## 2021-04-18 ENCOUNTER — Other Ambulatory Visit: Payer: Self-pay

## 2021-04-18 DIAGNOSIS — R29818 Other symptoms and signs involving the nervous system: Secondary | ICD-10-CM | POA: Diagnosis not present

## 2021-04-18 DIAGNOSIS — R278 Other lack of coordination: Secondary | ICD-10-CM | POA: Diagnosis not present

## 2021-04-18 NOTE — Therapy (Signed)
?OUTPATIENT OCCUPATIONAL THERAPY TREATMENT NOTE ? ? ?Patient Name: Natasha Vazquez ?MRN: [id number] ?DOB:04-12-1950, 71 y.o., female ?Today's Date: 04/18/2021 ? ?PCP: Carol Ada, MD ?REFERRING PROVIDER: Lauraine Rinne, PA-C ? ? OT End of Session - 04/18/21 1513   ? ? Visit Number 22   ? Number of Visits 24   ? Date for OT Re-Evaluation 04/23/21   ? Authorization Type 1) UHC Medicare 2) Medicaid   ? Authorization Time Period no visit limit   ? Progress Note Due on Visit 25   ? OT Start Time 1430   ? OT Stop Time 1508   ? OT Time Calculation (min) 38 min   ? Activity Tolerance Patient tolerated treatment well   ? Behavior During Therapy [facility] for tasks assessed/performed   ? ?  ?  ? ?  ? ? ? ?Past Medical History:  ?Diagnosis Date  ? Hypertension   ? TMJ (dislocation of temporomandibular joint)   ? ?Past Surgical History:  ?Procedure Laterality Date  ? ABDOMINAL HYSTERECTOMY    ? CHOLECYSTECTOMY    ? IR ANGIO INTRA EXTRACRAN SEL INTERNAL CAROTID BILAT MOD SED  09/16/2016  ? IR ANGIO VERTEBRAL SEL VERTEBRAL BILAT MOD SED  09/16/2016  ? MASS EXCISION Left 02/10/2020  ? Procedure: excision of soft tissue mass left upper back;  Surgeon: Jesusita Oka, MD;  Location: [facility];  Service: General;  Laterality: Left;  ? ?Patient Active Problem List  ? Diagnosis Date Noted  ? Slow transit constipation   ? Dyslipidemia   ? 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 Chronic heart failure with preserved ejection fraction (HFpEF) ([place]) 01/02/2021  ? Acute ischemic stroke ([place]) 12/26/2020  ? Essential hypertension 12/26/2020  ? Mixed hyperlipidemia 12/26/2020  ? Acute ischemic left MCA stroke ([place]) 12/26/2020  ? ? ?ONSET DATE: 12/25/20 ? ?REFERRING DIAG: s/p CVA ? ?THERAPY DIAG:  ?Other lack of coordination ? ?Other symptoms and signs involving the nervous system ? ? ?PERTINENT HISTORY: Pt is a 71 y/o female s/p left MCA CVA on 12/25/20 presenting with right hemiplegia and decreased functional use of  dominant RUE for ADLs.  ? ?PRECAUTIONS: None ? ?SUBJECTIVE: S: Yes. (When asked if her shoulder was sore again today. ? ?PAIN:  ?Are you having pain? Yes. Right shoulder sore ? ? ? ? ?OBJECTIVE:  ? ?TODAY'S TREATMENT:  ?-Myofascial release: completed separately from therapeutic exercises. Soft tissue and joint mobilization completed to right shoulder, upper trapezius, and anterior shoulder.  ?- P/ROM supine shoulder all ranges 10X ?- A/ROM: supine shoulder IR/er, protraction; 10X ?- AA/ROM: supine, shoulder flexion, horizontal abduction 2 sets of 5X ?- functional reaching and grasp/release task: standing at door. OT provided set up of Squigz (purple, blue, red only). Paced on door, then removed with OT loosening Squigz once patient placed her hand around.  ?- UBE: level 1 2' forward focusing on right hand grasp on handle. ? ? ? ? ? ?04/16/21: ?-Myofascial release: completed separately from therapeutic exercises. Soft tissue and joint mobilization completed to right shoulder, upper trapezius, and anterior shoulder.  ?-Supine; A/ROM with therapist providing very limited guidance for movement. Shoulder protraction, er, flexion, 5X  ?-Functional Reaching: Pt removing 10 large pegs from pegboard attached to standing mirror, OT cuing for keeping feet still and not leaning by placing hands at pt hips. Pt then placing 5 double pegs (2 pegs put together) along bottom of pegboard at approximately waist height. Pt requiring mod assist for set-up and  lining up with hole.  ?-Functional Reaching: standing at kitchen cabinet, pt removing and replacing items from bottom shelf. OT stabilizing items for pt to work on opening thumb and index finger for pinching.  ?-UBE: level 1, 2' forward, OT stabilizing at elbow and at hand as needed, pace: 3.0 ? ? ? ? OT Short Term Goals   ? ?  ? OT SHORT TERM GOAL #1  ? Title Pt will be provided with and educated on HEP to improve mobility of RUE as required for ADL completion.   ? Time 4   ?  Period Weeks   ? Status On-going   ? Target Date 02/23/21   ?  ? OT SHORT TERM GOAL #2  ? Title Pt will increase RUE strength to 4-/5 to improve ability to use RUE as assist when carrying items during ADLs.   ? Time 4   ? Period Weeks   ? Status Partially Met   ?  ? OT SHORT TERM GOAL #3  ? Title Pt will demonstrate activation of wrist and digits required for a gross grasp on objects.   ? Time 4   ? Period Weeks   ?  ? OT SHORT TERM GOAL #4  ? Title Pt will be educated on and demonstrate independence in NMR strategies to improve functional use of RUE.   ? Time 4   ? Period Weeks   ? ?  ?  ? ?  ? ? ? OT Long Term Goals   ? ?  ? OT LONG TERM GOAL #1  ? Title Pt will increase RUE strength to 4/5 or greater to improve ability to use RUE as active assist during ADLs.   ? Time 8   ? Period Weeks   ? Status On-going   ? Target Date 03/25/21   ?  ? OT LONG TERM GOAL #2  ? Title Pt will demonstrate 25% or greater increase in A/ROM in RUE to improve ability to perform functional reaching tasks.   ? Time 8   ? Period Weeks   ?  ? OT LONG TERM GOAL #3  ? Title Pt will demonstrate fine motor activation and control by completing 9 hole peg test or box and blocks test for coordination.   ? Time 8   ? Period Weeks   ? Status On-going   ?  ? OT LONG TERM GOAL #4  ? Title Pt will improve grip strength to 10# or greater and pinch strength to 5# or greater to improve ability to grasp and hold items during ADL completion.   ? Time 8   ? Period Weeks   ? Status On-going   ? ?  ?  ? ?  ? ? ? Plan - 04/18/21 1649   ? ? Clinical Impression Statement A: Completed manual techniques at start of session to address shoulder and scapular pain/discomfort. Completed shoulder stability work and scapular strength supine and standing. Max difficulty with using [place] although was able to focus on functional reaching with assist for occasional support and grasp to pull off.    ? Body Structure / Function / Physical Skills ADL;Endurance;UE functional  use;ROM;Proprioception;IADL;Strength;Edema;Tone;Sensation;Coordination;FMC   ? Plan P: Continue to work on A/ROM, AA/ROM, shoulder/scapular stability. Grasp and functional reaching  ? OT Home Exercise Plan eval: weightbearing; 1/3: shoulder and elbow AA/ROM 1/12: P/ROM fingers/thumb. Ball roll wrist flexion/extension; 1/25: wrist and forearm A/ROM   ? Consulted and Agree with Plan of Care Patient;Family member/caregiver   ?  Family Member Consulted Granddaughter   ? ?  ?  ? ?  ? ? ? ? ?Ailene Ravel, OTR/L,CBIS  ?[phone] ? ?04/18/2021, 4:49 PM ? ?  ? ? ? ?

## 2021-04-23 ENCOUNTER — Encounter (HOSPITAL_COMMUNITY): Payer: Self-pay | Admitting: Occupational Therapy

## 2021-04-23 ENCOUNTER — Other Ambulatory Visit: Payer: Self-pay

## 2021-04-23 ENCOUNTER — Ambulatory Visit (HOSPITAL_COMMUNITY): Payer: Medicare Other | Admitting: Occupational Therapy

## 2021-04-23 DIAGNOSIS — R278 Other lack of coordination: Secondary | ICD-10-CM

## 2021-04-23 DIAGNOSIS — R29818 Other symptoms and signs involving the nervous system: Secondary | ICD-10-CM

## 2021-04-23 NOTE — Therapy (Addendum)
?OUTPATIENT OCCUPATIONAL THERAPY REASSESSMENT & TREATMENT NOTE ?DISCHARGE SUMMARY ? ? ?Patient Name: Natasha Vazquez ?MRN: [id number] ?DOB:[birth year], 71 y.o., female ?Today's Date: 04/23/2021 ? ?PCP: Carol Ada, MD ?REFERRING PROVIDER: Lauraine Rinne, PA-C ? ? OT End of Session - 04/23/21 1203   ? ? Visit Number 23   ? Number of Visits 24   ? Date for OT Re-Evaluation 04/23/21   ? Authorization Type 1) UHC Medicare 2) Medicaid   ? Authorization Time Period no visit limit   ? Progress Note Due on Visit 25   ? OT Start Time 1117   ? OT Stop Time 1157   ? OT Time Calculation (min) 40 min   ? Activity Tolerance Patient tolerated treatment well   ? Behavior During Therapy [facility] for tasks assessed/performed   ? ?  ?  ? ?  ? ? ? ? ?Past Medical History:  ?Diagnosis Date  ? Hypertension   ? TMJ (dislocation of temporomandibular joint)   ? ?Past Surgical History:  ?Procedure Laterality Date  ? ABDOMINAL HYSTERECTOMY    ? CHOLECYSTECTOMY    ? IR ANGIO INTRA EXTRACRAN SEL INTERNAL CAROTID BILAT MOD SED  09/16/2016  ? IR ANGIO VERTEBRAL SEL VERTEBRAL BILAT MOD SED  09/16/2016  ? MASS EXCISION Left 02/10/2020  ? Procedure: excision of soft tissue mass left upper back;  Surgeon: Jesusita Oka, MD;  Location: [facility];  Service: General;  Laterality: Left;  ? ?Patient Active Problem List  ? Diagnosis Date Noted  ? Slow transit constipation   ? Dyslipidemia   ? 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 Chronic heart failure with preserved ejection fraction (HFpEF) ([place]) 01/02/2021  ? Acute ischemic stroke ([place]) 12/26/2020  ? Essential hypertension 12/26/2020  ? Mixed hyperlipidemia 12/26/2020  ? Acute ischemic left MCA stroke ([place]) 12/26/2020  ? ? ?ONSET DATE: 12/25/20 ? ?REFERRING DIAG: s/p CVA ? ?THERAPY DIAG:  ?Other lack of coordination ? ?Other symptoms and signs involving the nervous system ? ? ?PERTINENT HISTORY: Pt is a 71 y/o female s/p left MCA CVA on 12/25/20 presenting with right  hemiplegia and decreased functional use of dominant RUE for ADLs.  ? ?PRECAUTIONS: None ? ?SUBJECTIVE: S: Indicates that she has been stretching her arm.  ? ?PAIN:  ?Are you having pain? Yes. Indicates Right shoulder sore ? ? ? ? ?OBJECTIVE:  ? ?TODAY'S TREATMENT:  ?Reassessment completed today, please see below for updated measurements. Extensive time spent on reassessment, progress throughout time in therapy, and education provided on lack of progress since last reassessment on 03/26/21. Pt and granddaughter verbalize understanding of education and plan to discuss current functioning and potential options of medical management of increasing tone.  ? ? ? 04/23/21 1120  ?Assessment  ?Medical Diagnosis s/p L Ischemic MCA CVA  ?Precautions  ?Precautions None  ?AROM  ?Overall AROM Comments Pt able to complete elbow and forearm A/ROM  ?Right/Left Shoulder Right  ?Right Shoulder Flexion 95 Degrees ?(110 previous)  ?Right Shoulder ABduction 61 Degrees ?(90 previous)  ?Right Shoulder Internal Rotation 90 Degrees ?(same as previous)  ?Right Shoulder External Rotation 14 Degrees ?(18 previous)  ?Right/Left Forearm Right  ?Right Forearm Pronation 90 Degrees ?(same as previous)  ?Right Forearm Supination 73 Degrees ?(70 previous)  ?Right/Left Wrist Right  ?Right Wrist Extension 38 Degrees ?(25 previous)  ?Right Wrist Flexion 35 Degrees ?(50 previous)  ?Right Wrist Radial Deviation 20 Degrees ?(same as previous)  ?Right Wrist Ulnar Deviation 20 Degrees ?(same as  previous)  ?Strength  ?Right Hand Lateral Pinch 2 lbs ?(same as previous)  ?Right Hand 3 Point Pinch 1 lbs ?(0 previous)  ?Right Shoulder Flexion 3-/5 ?(same as previous)  ?Right Shoulder ABduction 3-/5 ?(same as previous)  ?Right Shoulder Internal Rotation 5/5 ?(same as previous)  ?Right Shoulder External Rotation 4+/5 ?(5/5 previous)  ?Right Forearm Pronation 4+/5 ?(same as previous)  ?Right Forearm Supination 4+/5 ?(same as previous)  ?Right Wrist Flexion 4/5 ?(same  as previous)  ?Right Wrist Extension 4/5 ?(same as previous)  ?Right Wrist Radial Deviation 3+/5 ?(same as previous)  ?Right Wrist Ulnar Deviation 3+/5 ?(same as previous)  ?Right/Left Shoulder Right  ?Right/Left Forearm Right  ?Right/Left hand Right  ?Right Hand Gross Grasp Impaired  ?Right Hand Grip (lbs) 0 ?(same as previous)  ? ? ? ? ? ?04/18/21 ?-Myofascial release: completed separately from therapeutic exercises. Soft tissue and joint mobilization completed to right shoulder, upper trapezius, and anterior shoulder.  ?- P/ROM supine shoulder all ranges 10X ?- A/ROM: supine shoulder IR/er, protraction; 10X ?- AA/ROM: supine, shoulder flexion, horizontal abduction 2 sets of 5X ?- functional reaching and grasp/release task: standing at door. OT provided set up of Squigz (purple, blue, red only). Paced on door, then removed with OT loosening Squigz once patient placed her hand around.  ?- UBE: level 1 2' forward focusing on right hand grasp on handle. ? ? ? ? ? ?04/16/21: ?-Myofascial release: completed separately from therapeutic exercises. Soft tissue and joint mobilization completed to right shoulder, upper trapezius, and anterior shoulder.  ?-Supine; A/ROM with therapist providing very limited guidance for movement. Shoulder protraction, er, flexion, 5X  ?-Functional Reaching: Pt removing 10 large pegs from pegboard attached to standing mirror, OT cuing for keeping feet still and not leaning by placing hands at pt hips. Pt then placing 5 double pegs (2 pegs put together) along bottom of pegboard at approximately waist height. Pt requiring mod assist for set-up and lining up with hole.  ?-Functional Reaching: standing at kitchen cabinet, pt removing and replacing items from bottom shelf. OT stabilizing items for pt to work on opening thumb and index finger for pinching.  ?-UBE: level 1, 2' forward, OT stabilizing at elbow and at hand as needed, pace: 3.0 ? ? ? ? OT Short Term Goals   ? ?  ? OT SHORT TERM GOAL #1   ? Title Pt will be provided with and educated on HEP to improve mobility of RUE as required for ADL completion.   ? Time 4   ? Period Weeks   ? Status Met   ? Target Date 02/23/21   ?  ? OT SHORT TERM GOAL #2  ? Title Pt will increase RUE strength to 4-/5 to improve ability to use RUE as assist when carrying items during ADLs.   ? Time 4   ? Period Weeks   ? Status Partially Met   ?  ? OT SHORT TERM GOAL #3  ? Title Pt will demonstrate activation of wrist and digits required for a gross grasp on objects.   ? Time 4   ? Period Weeks   ?  ? OT SHORT TERM GOAL #4  ? Title Pt will be educated on and demonstrate independence in NMR strategies to improve functional use of RUE.   ? Time 4   ? Period Weeks   ? ?  ?  ? ?  ? ? ? OT Long Term Goals   ? ?  ? OT LONG TERM  GOAL #1  ? Title Pt will increase RUE strength to 4/5 or greater to improve ability to use RUE as active assist during ADLs.   ? Time 8   ? Period Weeks   ? Status Partially Met  ? Target Date 03/25/21   ?  ? OT LONG TERM GOAL #2  ? Title Pt will demonstrate 25% or greater increase in A/ROM in RUE to improve ability to perform functional reaching tasks.   ? Time 8   ? Period Weeks   ?  ? OT LONG TERM GOAL #3  ? Title Pt will demonstrate fine motor activation and control by completing 9 hole peg test or box and blocks test for coordination.   ? Time 8   ? Period Weeks   ? Status Not Met  ?  ? OT LONG TERM GOAL #4  ? Title Pt will improve grip strength to 10# or greater and pinch strength to 5# or greater to improve ability to grasp and hold items during ADL completion.   ? Time 8   ? Period Weeks   ? Status Not Met   ? ?  ?  ? ?  ? ? ? Plan   ? ? Clinical Impression Statement A: Reassessment completed this session. Pt is demonstrating increasing tone throughout RUE causing increased pain and decreased available ROM and functional use. Pt has met 3/4 STGs and 1/4 LTG with an additional STG and LTG partially met.  Throughout her time in therapy pt has progressed  from flaccid RUE to performing A/ROM throughout with shoulder being at 50% ROM and strength being fair to good throughout. Recently pt is demonstrating increased difficulty with shoulder A/ROM and strength, as w

## 2021-04-23 NOTE — Patient Instructions (Signed)
Repeat all exercises 10-15 times, 1-2 times per day. ? ?1) Shoulder Protraction-laying on your back ? ? ? ?Begin with elbows by your side, slowly "punch" straight out in front of you.   ? ? ?2) Internal & External Rotation ? ? Supine:      ?   ? ?Stand with elbows at the side and elbows bent 90 degrees. Move your forearms away from your body, then bring back inward toward the body.   ? ? ? ?3) Shoulder FLEXION  ? ?In the standing position, hold a wand/cane with both arms, palms down on both sides. Raise up the wand/cane allowing your unaffected arm to perform most of the effort. Your affected arm should be partially relaxed.   ? ? ? ? ? ? ? ?Wrist AROM Exercises  ? ?1) Wrist Flexion ? ?Start with wrist at edge of table, palm facing up. With wrist hanging slightly off table, curl wrist upward, and back down. ? ? ? ? ? ?2) Wrist Extension ? ?Start with wrist at edge of table, palm facing down. With wrist slightly off the edge of the table, curl wrist up and back down. ? ? ? ? ? ?3) Radial Deviations ? ?Start with forearm flat against a table, wrist hanging slightly off the edge, and palm facing the wall. Bending at the wrist only, and keeping palm facing the wall, bend wrist so fist is pointing towards the floor, back up to start position, and up towards the ceiling. Return to start. ? ? ? ? ? ? ? ?4) WRIST PRONATION ? ?Turn your forearm towards palm face down. ? ?Keep your elbow bent and by the side of your  Body. ? ? ? ? ? ?5) WRIST SUPINATION ? ?Turn your forearm towards palm face up. ? ?Keep your elbow bent and by the side of your  Body. ? ? ? ? ? ? ? ? ? ? ? ? ? ? ? ? ? ?

## 2021-04-24 ENCOUNTER — Encounter: Payer: Self-pay | Admitting: Neurology

## 2021-04-24 ENCOUNTER — Ambulatory Visit (INDEPENDENT_AMBULATORY_CARE_PROVIDER_SITE_OTHER): Payer: Medicare Other | Admitting: Neurology

## 2021-04-24 VITALS — BP 177/90 | HR 75 | Ht 68.0 in | Wt 145.4 lb

## 2021-04-24 DIAGNOSIS — R4701 Aphasia: Secondary | ICD-10-CM

## 2021-04-24 DIAGNOSIS — I63511 Cerebral infarction due to unspecified occlusion or stenosis of right middle cerebral artery: Secondary | ICD-10-CM | POA: Insufficient documentation

## 2021-04-24 DIAGNOSIS — E7800 Pure hypercholesterolemia, unspecified: Secondary | ICD-10-CM | POA: Insufficient documentation

## 2021-04-24 DIAGNOSIS — I63512 Cerebral infarction due to unspecified occlusion or stenosis of left middle cerebral artery: Secondary | ICD-10-CM | POA: Diagnosis not present

## 2021-04-24 DIAGNOSIS — R011 Cardiac murmur, unspecified: Secondary | ICD-10-CM | POA: Insufficient documentation

## 2021-04-24 DIAGNOSIS — H547 Unspecified visual loss: Secondary | ICD-10-CM | POA: Insufficient documentation

## 2021-04-24 DIAGNOSIS — I69351 Hemiplegia and hemiparesis following cerebral infarction affecting right dominant side: Secondary | ICD-10-CM | POA: Diagnosis not present

## 2021-04-24 DIAGNOSIS — I69320 Aphasia following cerebral infarction: Secondary | ICD-10-CM | POA: Insufficient documentation

## 2021-04-24 DIAGNOSIS — E2839 Other primary ovarian failure: Secondary | ICD-10-CM | POA: Insufficient documentation

## 2021-04-24 DIAGNOSIS — D179 Benign lipomatous neoplasm, unspecified: Secondary | ICD-10-CM | POA: Insufficient documentation

## 2021-04-24 NOTE — Patient Instructions (Signed)
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 I also advised the patient to eat a healthy diet with plenty of whole grains, cereals, fruits and vegetables, exercise regularly and maintain ideal body weight continue ongoing occupational and resume speech therapy.  Recommend 30-day heart monitor for paroxysmal A-fib.  Followup in the future with my nurse practitioner in 3 months or call earlier if necessary. ? ?Stroke Prevention ?Some medical conditions and behaviors can lead to a higher chance of having a stroke. You can help prevent a stroke by eating healthy, exercising, not smoking, and managing any medical conditions you have. ?Stroke is a leading cause of functional impairment. Primary prevention is particularly important because a majority of strokes are first-time events. Stroke changes the lives of not only those who experience a stroke but also their family and other caregivers. ?How can this condition affect me? ?A stroke is a medical emergency and should be treated right away. A stroke can lead to brain damage and can sometimes be life-threatening. If a person gets medical treatment right away, there is a better chance of surviving and recovering from a stroke. ?What can increase my risk? ?The following medical conditions may increase your risk of a stroke: ?Cardiovascular disease. ?High blood pressure (hypertension). ?Diabetes. ?High cholesterol. ?Sickle cell disease. ?Blood clotting  disorders (hypercoagulable state). ?Obesity. ?Sleep disorders (obstructive sleep apnea). ?Other risk factors include: ?Being older than age 46. ?Having a history of blood clots, stroke, or mini-stroke (transient ischemic attack, TIA). ?Genetic factors, such as race, ethnicity, or a family history of stroke. ?Smoking cigarettes or using other tobacco products. ?Taking birth control pills, especially if you also use tobacco. ?Heavy use of alcohol or drugs, especially cocaine and methamphetamine. ?Physical inactivity. ?What actions can I take to prevent this? ?Manage your health conditions ?High cholesterol levels. ?Eating a healthy diet is important for preventing high cholesterol. If cholesterol cannot be managed through diet alone, you may need to take medicines. ?Take any prescribed medicines to control your cholesterol as told by your health care provider. ?Hypertension. ?To reduce your risk of stroke, try to keep your blood pressure below 130/80. ?Eating a healthy diet and exercising regularly are important for controlling blood pressure. If these steps are not enough to manage your blood pressure, you may need to take medicines. ?Take any prescribed medicines to control hypertension as told by your health care provider. ?Ask your health care provider if you should monitor your blood pressure at home. ?Have your blood pressure checked every year, even if your blood pressure is normal. Blood pressure increases with age and some medical conditions. ?Diabetes. ?Eating a healthy diet and exercising regularly are important parts of managing your blood sugar (glucose). If your blood sugar cannot be managed through diet and exercise, you may need to take medicines. ?Take any prescribed medicines to control your diabetes as told by your health care provider. ?Get evaluated for obstructive sleep apnea. Talk to your health care provider about getting a sleep evaluation if you snore a lot or have excessive sleepiness. ?Make  sure that any  other medical conditions you have, such as atrial fibrillation or atherosclerosis, are managed. ?Nutrition ?Follow instructions from your health care provider about what to eat or drink to help manage your health condition. These instructions may include: ?Reducing your daily calorie intake. ?Limiting how much salt (sodium) you use to 1,500 milligrams (mg) each day. ?Using only healthy fats for cooking, such as olive oil, canola oil, or sunflower oil. ?Eating healthy foods. You can do this by: ?Choosing foods that are high in fiber, such as whole grains, and fresh fruits and vegetables. ?Eating at least 5 servings of fruits and vegetables a day. Try to fill one-half of your plate with fruits and vegetables at each meal. ?Choosing lean protein foods, such as lean cuts of meat, poultry without skin, fish, tofu, beans, and nuts. ?Eating low-fat dairy products. ?Avoiding foods that are high in sodium. This can help lower blood pressure. ?Avoiding foods that have saturated fat, trans fat, and cholesterol. This can help prevent high cholesterol. ?Avoiding processed and prepared foods. ?Counting your daily carbohydrate intake. ? ?Lifestyle ?If you drink alcohol: ?Limit how much you have to: ?0-1 drink a day for women who are not pregnant. ?0-2 drinks a day for men. ?Know how much alcohol is in your drink. In the [place] one drink equals one 12 oz bottle of beer (364m), one 5 oz glass of wine (147m, or one 1? oz glass of hard liquor (4440m ?Do not use any products that contain nicotine or tobacco. These products include cigarettes, chewing tobacco, and vaping devices, such as e-cigarettes. If you need help quitting, ask your health care provider. ?Avoid secondhand smoke. ?Do not use drugs. ?Activity ? ?Try to stay at a healthy weight. ?Get at least 30 minutes of exercise on most days, such as: ?Fast walking. ?Biking. ?Swimming. ?Medicines ?Take over-the-counter and prescription medicines only as told by your  health care provider. Aspirin or blood thinners (antiplatelets or anticoagulants) may be recommended to reduce your risk of forming blood clots that can lead to stroke. ?Avoid taking birth control pills. Talk to your health care provider about the risks of taking birth control pills if: ?You are over 35 16ars old. ?You smoke. ?You get very bad headaches. ?You have had a blood clot. ?Where to find more information ?American Stroke Association: www.strokeassociation.org ?Get help right away if: ?You or a loved one has any symptoms of a stroke. "BE FAST" is an easy way to remember the main warning signs of a stroke: ?B - Balance. Signs are dizziness, sudden trouble walking, or loss of balance. ?E - Eyes. Signs are trouble seeing or a sudden change in vision. ?F - Face. Signs are sudden weakness or numbness of the face, or the face or eyelid drooping on one side. ?A - Arms. Signs are weakness or numbness in an arm. This happens suddenly and usually on one side of the body. ?S - Speech. Signs are sudden trouble speaking, slurred speech, or trouble understanding what people say. ?T - Time. Time to call emergency services. Write down what time symptoms started. ?You or a loved one has other signs of a stroke, such as: ?A sudden, severe headache with no known cause. ?Nausea or vomiting. ?Seizure. ?These symptoms may represent a serious problem that is an emergency. Do not wait to see if the symptoms will go away. Get medical help right away. Call your local emergency services (911 in the [place] Do not drive yourself to the hospital. ?Summary ?You can help to prevent a  stroke by eating healthy, exercising, not smoking, limiting alcohol intake, and managing any medical conditions you may have. ?Do not use any products that contain nicotine or tobacco. These include cigarettes, chewing tobacco, and vaping devices, such as e-cigarettes. If you need help quitting, ask your health care provider. ?Remember "BE FAST" for warning  signs of a stroke. Get help right away if you or a loved one has any of these signs. ?This information is not intended to replace advice given to you by your health care provider. Make sure you discuss a

## 2021-04-24 NOTE — Progress Notes (Signed)
?[facility] ?[address] ?[place]. [place] [address] ?[phone] ? ?     OFFICE CONSULT NOTE ? ?[name] ?Date of Birth:  06-03-1950 ?Medical Record Number:  [id number]  ? ?Referring MD:  Silvestre Mesi PA-c ? ?Reason for Referral:  Stroke ? ?HPI: NatashaVazquez is a pleasant 71 year old lady seen today for initial office consultation visit for stroke.  She is accompanied by her granddaughter.  History is obtained from them and review of electronic medical records and opossum reviewed pertinent available imaging films in PACS.  She has no significant past medical history except hypertension.  She was brought in for evaluation to [facility] by family for sudden onset of inability to speak and right-sided weakness.  NIH stroke scale was 13 on admission.  She was last known to be normal at 7 PM on 12/24/2020.  Nobody spoke to her or saw her until next day till 7:30 PM when son went to check on her and found her to be lying on the floor unable to speak and with right-sided weakness.  Code stroke was called but since last known normal was beyond 24 hours she is not a candidate for tPA.  MRI scan was obtained which showed acute patchy left MCA infarcts involving parts of frontal, parietal temporal lobes and basal ganglia.  There is no significant edema or midline shift.  CT angiogram showed no intracranial large vessel occlusion.  Multifocal calcifications were noted in the left MCA with multifocal stenosis involving distal branch vessels and right A1 anterior cerebral artery and bilateral distal MCA branches.  Echocardiogram showed normal ejection fraction of 65 to 70% without definite cardiac source of embolism.  LDL cholesterol was 77 mg percent and hemoglobin A1c was 5.6.  Patient was seen by therapist and transferred to inpatient rehab where she spent several weeks and is currently discharged home.  She was ordered to have a outpatient 30-day heart monitor but for unclear reason that  has not yet been done.  Patient is presently living at home with her granddaughter.  Her aphasia persist and speech is not significantly improved.  Right upper extremity also remains significantly weak though her leg strength is improved.  She is able to walk independently.  Family has now arranged to provide 24-hour care for her at home.  She is currently doing outpatient occupational and speech therapy and has been discharged from physical therapy.  She is still on aspirin and Plavix and tolerating well without bruising or bleeding.  There is no prior history of atrial fibrillation, palpitations, syncope or significant cardiac problems.  She has had no recurrent stroke or TIA symptoms. ? ?ROS:   ?14 system review of systems is positive for speech difficulty, aphasia, dysarthria, weakness, walking difficulty, all other systems negative ? ?PMH:  ?Past Medical History:  ?Diagnosis Date  ? Hypertension   ? TMJ (dislocation of temporomandibular joint)   ? ? ?Social History:  ?Social History  ? ?Socioeconomic History  ? Marital status: Legally Separated  ?  Spouse name: Not on file  ? Number of children: Not on file  ? Years of education: Not on file  ? Highest education level: Not on file  ?Occupational History  ? Not on file  ?Tobacco Use  ? Smoking status: Never  ? Smokeless tobacco: Never  ?Vaping Use  ? Vaping Use: Never used  ?Substance and Sexual Activity  ? Alcohol use: No  ? Drug use: No  ? Sexual activity: Not on  file  ?Other Topics Concern  ? Not on file  ?Social History Narrative  ? Not on file  ? ?Social Determinants of Health  ? ?Financial Resource Strain: Not on file  ?Food Insecurity: Not on file  ?Transportation Needs: Not on file  ?Physical Activity: Not on file  ?Stress: Not on file  ?Social Connections: Not on file  ?Intimate Partner Violence: Not on file  ? ? ?Medications:   ?Current Outpatient Medications on File Prior to Visit  ?Medication Sig Dispense Refill  ? acetaminophen (TYLENOL) 325 MG  tablet Take 2 tablets (650 mg total) by mouth every 4 (four) hours as needed for mild pain (or temp > 37.5 C (99.5 F)).    ? amLODipine (NORVASC) 2.5 MG tablet Take 1 tablet (2.5 mg total) by mouth daily. 30 tablet 0  ? carboxymethylcellulose (REFRESH PLUS) 0.5 % SOLN Place 1 drop into both eyes in the morning and at bedtime.    ? clopidogrel (PLAVIX) 75 MG tablet Take 1 tablet (75 mg total) by mouth daily. 71 tablet 0  ? cycloSPORINE (RESTASIS) 0.05 % ophthalmic emulsion Place 1 drop into both eyes 2 (two) times daily.    ? Multiple Vitamins-Minerals (PRESERVISION AREDS 2 PO) Take 1 tablet by mouth in the morning and at bedtime.    ? Omega-3 Fatty Acids (FISH OIL) 1000 MG CAPS Take 1 capsule (1,000 mg total) by mouth daily. 30 capsule 0  ? rosuvastatin (CRESTOR) 20 MG tablet Take 1 tablet (20 mg total) by mouth daily. 30 tablet 0  ? ?No current facility-administered medications on file prior to visit.  ? ? ?Allergies:   ?Allergies  ?Allergen Reactions  ? Lisinopril   ?  Other reaction(s): cough  ? Penicillin V Potassium   ?  Other reaction(s): hives  ? Penicillins Rash and Other (See Comments)  ?  Has patient had a PCN reaction causing immediate rash, facial/tongue/throat swelling, SOB or lightheadedness with hypotension: Yes ?Has patient had a PCN reaction causing severe rash involving mucus membranes or skin necrosis: No ?Has patient had a PCN reaction that required hospitalization: Unknown ?Has patient had a PCN reaction occurring within the last 10 years: No ?If all of the above answers are "NO", then may proceed with Cephalosporin use. ?  ? ? ?Physical Exam ?General: well developed, well nourished, seated, in no evident distress ?Head: head normocephalic and atraumatic.   ?Neck: supple with no carotid or supraclavicular bruits ?Cardiovascular: regular rate and rhythm, no murmurs ?Musculoskeletal: no deformity ?Skin:  no rash/petichiae ?Vascular:  Normal pulses all extremities ? ?Neurologic Exam ?Mental Status:  Awake and fully alert.  Moderate expressive aphasia cannot speak only a few words occasional short sentences.  Good comprehension.  Unable to name and repeat.   ?Cranial Nerves: Fundoscopic exam reveals sharp disc margins. Pupils equal, briskly reactive to light. Extraocular movements full without nystagmus. Visual fields full to confrontation. Hearing intact. Facial sensation intact.  Mild right lower facial weakness, tongue, palate moves normally and symmetrically.  ?Motor: Spastic right hemi-plegia with right upper extremity 3/5 strength proximally and at the elbow.  Weakness of grip and intrinsic hand muscles.  Right lower extremity strength is 4/5 with mild weakness of hip flexors and ankle dorsiflexors.  Tone is increased on the right compared to the left.Marland Kitchen ?Sensory.: intact to touch , pinprick , position and vibratory sensation.  ?Coordination: Rapid alternating movements normal in all extremities. Finger-to-nose and heel-to-shin performed accurately bilaterally. ?Gait and Station: Arises from chair without difficulty. Stance  is normal. Gait demonstrates normal stride length and balance . Able to heel, toe and tandem walk without difficulty.  ?Reflexes: 1+ and symmetric. Toes downgoing.  ? ?NIHSS  5 ?Modified Rankin  3 ? ? ?ASSESSMENT: 71 year old lady with left middle cerebral artery embolic infarct of cryptogenic etiology and November 2022.  Vascular risk factors of hypertension, hyperlipidemia, only. ? ? ? 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 I also advised the patient to eat a healthy diet with plenty of whole grains, cereals, fruits and vegetables, exercise regularly a

## 2021-04-25 ENCOUNTER — Encounter (HOSPITAL_COMMUNITY): Payer: Medicare Other | Admitting: Occupational Therapy

## 2021-05-02 ENCOUNTER — Encounter (HOSPITAL_COMMUNITY): Payer: Medicare Other | Admitting: Speech Pathology

## 2021-05-07 ENCOUNTER — Encounter: Payer: Self-pay | Admitting: Physical Medicine & Rehabilitation

## 2021-05-07 ENCOUNTER — Encounter: Payer: Medicare Other | Attending: Registered Nurse | Admitting: Physical Medicine & Rehabilitation

## 2021-05-07 VITALS — BP 183/83 | HR 75 | Ht 68.0 in | Wt 143.8 lb

## 2021-05-07 DIAGNOSIS — G8330 Monoplegia, unspecified affecting unspecified side: Secondary | ICD-10-CM | POA: Diagnosis not present

## 2021-05-07 DIAGNOSIS — I69320 Aphasia following cerebral infarction: Secondary | ICD-10-CM | POA: Diagnosis not present

## 2021-05-07 NOTE — Progress Notes (Signed)
? ?Subjective:  ? ? Patient ID: Natasha Vazquez, female    DOB: [birth year], 71 y.o.   MRN: [id number] ?71 y.o. right-handed female with history of hypertension GI bleed as well as hyperlipidemia.  Per chart review lives alone independent household ambulator.  She does not drive.  Presented to [facility] 12/25/2020 with right side weakness aphasia and altered mental status.  Blood pressure was elevated 178/73.  Admission chemistries unremarkable save alcohol negative glucose 147.  CT angiogram head and neck hypodensity in the posterior left frontal lobe concerning for acute/subacute infarction.  No intracranial large vessel occlusion.  Multifocal narrowing of the right A1 and bilateral distal MCA branches.  No hemodynamically significant stenosis in the neck.  MRI of the brain extensive acute infarcts throughout the left cerebrum including left frontal parietal and temporal lobes and left basal ganglia.  There was mild petechial hemorrhage.  Patient did not receive tPA.  Echocardiogram with ejection fraction of 65 to 70% no regional wall motion abnormalities grade 1 diastolic dysfunction.  Neurology follow-up maintained on aspirin 325 mg daily and Plavix 75 mg daily for CVA prophylaxis x3 months then aspirin alone.  Therapy evaluations completed due to patient's right side weakness and dysarthric speech was admitted for a comprehensive rehab program. ?  ?  ?Hospital Course: JEREE DELCID was admitted to rehab 01/02/2021 for inpatient therapies to consist of PT, ST and OT at least three hours five days a week. Past admission physiatrist, therapy team and rehab RN have worked together to provide customized collaborative inpatient rehab.  Pertaining to patient's left MCA infarction including left frontal parietal temporal lobes remained stable she continued on aspirin 325 mg daily and Plavix 85 mg day x3 months then aspirin alone.  No bleeding episodes.  She would follow neurology services.  Blood pressure  somewhat soft maintained on Norvasc her Cozaar was held she would need outpatient follow-up.   ?Admit date: 01/02/2021 ?Discharge date: 01/15/2021 ?HPI ?Ambulates without assistive device ?Lives alone with 7 hours/day with daughter and granddaughter assistance ?6 steps to enter ?Does not drive ?No walking limitations in terms of distance. ?She climbs steps ?She needs assistance with meal prep but otherwise independent with ADLs ?Has been seeing primary care physician for blood pressure ? ?Bathes with shower chair ? ?Pain Inventory ?Average Pain 0 ?Pain Right Now 0 ? ?BOWEL ?Number of stools per week: 4 ? ?BLADDER ?Normal ? ?Mobility ?walk without assistance ?ability to climb steps?  yes ?do you drive?  no ? ?Function ?retired ?I need assistance with the following:  meal prep ? ?Neuro/Psych ?No problems in this area ? ?Prior Studies ?Any changes since last visit?  no ? ?Physicians involved in your care ?Any changes since last visit?  no ? ? ?No family history on file. ?Social History  ? ?Socioeconomic History  ? Marital status: Legally Separated  ?  Spouse name: Not on file  ? Number of children: Not on file  ? Years of education: Not on file  ? Highest education level: Not on file  ?Occupational History  ? Not on file  ?Tobacco Use  ? Smoking status: Never  ? Smokeless tobacco: Never  ?Vaping Use  ? Vaping Use: Never used  ?Substance and Sexual Activity  ? Alcohol use: No  ? Drug use: No  ? Sexual activity: Not on file  ?Other Topics Concern  ? Not on file  ?Social History Narrative  ? Not on file  ? ?Social Determinants of Health  ? ?  Financial Resource Strain: Not on file  ?Food Insecurity: Not on file  ?Transportation Needs: Not on file  ?Physical Activity: Not on file  ?Stress: Not on file  ?Social Connections: Not on file  ? ?Past Surgical History:  ?Procedure Laterality Date  ? ABDOMINAL HYSTERECTOMY    ? CHOLECYSTECTOMY    ? IR ANGIO INTRA EXTRACRAN SEL INTERNAL CAROTID BILAT MOD SED  09/16/2016  ? IR ANGIO  VERTEBRAL SEL VERTEBRAL BILAT MOD SED  09/16/2016  ? MASS EXCISION Left 02/10/2020  ? Procedure: excision of soft tissue mass left upper back;  Surgeon: Jesusita Oka, MD;  Location: [facility];  Service: General;  Laterality: Left;  ? ?Past Medical History:  ?Diagnosis Date  ? Hypertension   ? TMJ (dislocation of temporomandibular joint)   ? ?BP (!) 183/83   Pulse 75   Ht '5\' 8"'$  (1.727 m)   Wt 143 lb 12.8 oz (65.2 kg)   SpO2 97%   BMI 21.86 kg/m?  ? ?Opioid Risk Score:   ?Fall Risk Score:  `1 ? ?Depression screen PHQ 2/9 ? ? ?  05/07/2021  ?  1:58 PM 03/08/2021  ?  2:00 PM 01/29/2021  ?  3:18 PM  ?Depression screen PHQ 2/9  ?Decreased Interest 0 0 0  ?Down, Depressed, Hopeless 0 0 0  ?PHQ - 2 Score 0 0 0  ?Altered sleeping   0  ?Tired, decreased energy   0  ?Change in appetite   0  ?Feeling bad or failure about yourself    0  ?Trouble concentrating   0  ?Moving slowly or fidgety/restless   0  ?Suicidal thoughts   0  ?PHQ-9 Score   0  ?  ? ?Review of Systems  ?Constitutional: Negative.   ?HENT: Negative.    ?Eyes: Negative.   ?Respiratory: Negative.    ?Cardiovascular: Negative.   ?Gastrointestinal: Negative.   ?Endocrine: Negative.   ?Genitourinary: Negative.   ?Musculoskeletal: Negative.   ?Skin: Negative.   ?Allergic/Immunologic: Negative.   ?Neurological: Negative.   ?Hematological:  Bruises/bleeds easily.  ?     Plavix  ?Psychiatric/Behavioral: Negative.    ?All other systems reviewed and are negative. ? ?   ?Objective:  ? Physical Exam ? ?Motor strength is 4 - at the right deltoid bicep tricep grip 5/5 in the right hip flexor knee extensor ankle dorsiflexor 5/5 in the left upper and left lower limb ?Tone is normal right upper extremity ?Expressive aphasia noted, nonfluent, able to name simple objects ?Comprehension is good ?Ambulates without assistive device no evidence of toe drag or knee instability ? ? ?   ?Assessment & Plan:  ? ?#1.  Left MCA distribution infarct with right upper extremity  greater than lower extremity weakness. ?She is functioning at a modified independent level for basic care but needs assistance for higher level ADLs, shopping, medications.  The patient has just completed outpatient therapy has attended PT, speech and OT treatments for around 3 months.  Completed PT earlier given her lower extremity improvements. ?Do not think she needs PMNR follow-up ?Follow-up with primary care ?Follow-up with neurology ?

## 2021-05-07 NOTE — Patient Instructions (Signed)
Please call if Right arm develops more spasticity or pain  ?

## 2021-05-08 DIAGNOSIS — E7800 Pure hypercholesterolemia, unspecified: Secondary | ICD-10-CM | POA: Diagnosis not present

## 2021-05-08 DIAGNOSIS — I10 Essential (primary) hypertension: Secondary | ICD-10-CM | POA: Diagnosis not present

## 2021-05-08 DIAGNOSIS — R634 Abnormal weight loss: Secondary | ICD-10-CM | POA: Diagnosis not present

## 2021-05-08 DIAGNOSIS — R7303 Prediabetes: Secondary | ICD-10-CM | POA: Diagnosis not present

## 2021-05-08 DIAGNOSIS — I679 Cerebrovascular disease, unspecified: Secondary | ICD-10-CM | POA: Diagnosis not present

## 2021-05-08 DIAGNOSIS — I69320 Aphasia following cerebral infarction: Secondary | ICD-10-CM | POA: Diagnosis not present

## 2021-05-09 ENCOUNTER — Encounter (HOSPITAL_COMMUNITY): Payer: Medicare Other | Admitting: Speech Pathology

## 2021-05-29 ENCOUNTER — Encounter (HOSPITAL_COMMUNITY): Payer: Medicare Other | Admitting: Speech Pathology

## 2021-05-31 ENCOUNTER — Encounter (HOSPITAL_COMMUNITY): Payer: Medicare Other | Admitting: Occupational Therapy

## 2021-06-04 ENCOUNTER — Encounter (HOSPITAL_COMMUNITY): Payer: Medicare Other

## 2021-06-05 ENCOUNTER — Ambulatory Visit (HOSPITAL_COMMUNITY): Payer: Medicare Other | Attending: Physician Assistant | Admitting: Speech Pathology

## 2021-06-05 ENCOUNTER — Encounter (HOSPITAL_COMMUNITY): Payer: Self-pay | Admitting: Speech Pathology

## 2021-06-05 ENCOUNTER — Ambulatory Visit (HOSPITAL_COMMUNITY): Payer: Medicare Other

## 2021-06-05 DIAGNOSIS — R4701 Aphasia: Secondary | ICD-10-CM | POA: Insufficient documentation

## 2021-06-05 DIAGNOSIS — R278 Other lack of coordination: Secondary | ICD-10-CM

## 2021-06-05 DIAGNOSIS — R29818 Other symptoms and signs involving the nervous system: Secondary | ICD-10-CM | POA: Insufficient documentation

## 2021-06-05 DIAGNOSIS — I69890 Apraxia following other cerebrovascular disease: Secondary | ICD-10-CM | POA: Insufficient documentation

## 2021-06-05 NOTE — Therapy (Signed)
?OUTPATIENT SPEECH LANGUAGE PATHOLOGY APHASIA EVALUATION ? ? ?Patient Name: Natasha Vazquez ?MRN: [id number] ?DOB:[birth year], 71 y.o., female ?Today's Date: 06/05/2021 ? ?PCP: Carol Ada, MD ?REFERRING PROVIDER: Carol Ada, MD ? ? End of Session - 06/05/21 1138   ? ? Visit Number 1   ? Number of Visits 8   ? Date for SLP Re-Evaluation 07/25/21   ? Authorization Type [employer]   ? Progress Note Due on Visit --   10th visit  ? SLP Start Time 1030   ? SLP Stop Time  1115   ? SLP Time Calculation (min) 45 min   ? Activity Tolerance Patient tolerated treatment well   ? ?  ?  ? ?  ? ? ?Past Medical History:  ?Diagnosis Date  ? Hypertension   ? TMJ (dislocation of temporomandibular joint)   ? ?Past Surgical History:  ?Procedure Laterality Date  ? ABDOMINAL HYSTERECTOMY    ? CHOLECYSTECTOMY    ? IR ANGIO INTRA EXTRACRAN SEL INTERNAL CAROTID BILAT MOD SED  09/16/2016  ? IR ANGIO VERTEBRAL SEL VERTEBRAL BILAT MOD SED  09/16/2016  ? MASS EXCISION Left 02/10/2020  ? Procedure: excision of soft tissue mass left upper back;  Surgeon: Jesusita Oka, MD;  Location: [place];  Service: General;  Laterality: Left;  ? ?Patient Active Problem List  ? Diagnosis Date Noted  ? 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 Aphasia as late effect of cerebrovascular accident 04/24/2021  ? 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 Decreased estrogen level 04/24/2021  ? Diminished vision 04/24/2021  ? Heart murmur 04/24/2021  ? Lipoma 04/24/2021  ? Pure hypercholesterolemia 04/24/2021  ? Slow transit constipation   ? Dyslipidemia   ? 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 Chronic heart failure with preserved ejection fraction (HFpEF) ([place]) 01/02/2021  ? Acute ischemic stroke ([place]) 12/26/2020  ? Essential hypertension 12/26/2020  ? Mixed hyperlipidemia 12/26/2020  ? Acute ischemic left MCA stroke ([place]) 12/26/2020  ? ? ?ONSET DATE: 12/25/2020   ? ?REFERRING DIAG: Aphasia as late effect from CVA ? ?THERAPY DIAG:  ?Aphasia ? ?Apraxia following other cerebrovascular disease ? ?SUBJECTIVE:  ? ?SUBJECTIVE STATEMENT: ?"One, two" ?Pt accompanied by: self and family member ? ?PERTINENT HISTORY: Natasha Vazquez is a 71 yo female who was referred for speech/language evaluation and treatment by her PCP, Carol Ada, MD. 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 She was discharged from [facility] inpatient rehab on 01/15/21 and seen for outpatient SLP therapy from 01/24/21-03/26/21. She was discharged from SLP services at the request from the patient and her family due to Pt feeling frustrated about her aphasia. She made slow, but good progress during outpatient SLP therapy with [place] Aphasia Screening Tool (MAST) scores improving from 2/50 to 15/50 in Expressive portion and 26/50 to 28/50 for the Receptive portion. ? ? ?PAIN:  ?Are you having pain? No ? ?FALLS: Has patient fallen in last 6 months?  No ? ?LIVING ENVIRONMENT: ?Lives with: lives with their family ?Lives in: House/apartment ? ?PLOF:  ?Level of assistance: Needed assistance with ADLs ?Employment: Retired ? ? ?PATIENT GOALS Improve speech ? ?OBJECTIVE:  ? ?DIAGNOSTIC FINDINGS: See MAST below ? ?COGNITION: ?Overall cognitive status: History of cognitive impairments - at baseline and due to severity of aphasia ?Areas of impairment: Memory and Following commands ?Comments: Higher level executive functioning deficits noted due  to severity of aphasia ? ?AUDITORY COMPREHENSION: ?Overall auditory comprehension: Impaired: moderately complex ?YES/NO questions: Impaired: moderately complex ?Following directions: Impaired: moderately complex ?Conversation: Simple ?Interfering components: processing speed and working memory ?Effective technique: extra processing time, pausing, repetition/stressing words, written cues, stressing words, and visual/gestural  cues ? ? ?READING COMPREHENSION: Impaired: word ? ?EXPRESSION: verbal ? ?VERBAL EXPRESSION: ?Level of generative/spontaneous verbalization: word ?Automatic speech: name: impaired, social response: impaired, counting: impaired, and day of week: Requires initiation cues   ?Repetition:  Pt able to repeat some single words ?Naming: Responsive: 26-50%, Confrontation: 26-50%, and Divergent: 26-50% ?Pragmatics: Appears intact ?Comments: Relative strength in automatic speech task and phonemic/visual cues ?Interfering components:  ?Effective technique: sentence completion, phonemic cues, written cues, articulatory cues, and melodic intonation ?Non-verbal means of communication: gestures and writing ? ?WRITTEN EXPRESSION: ?Dominant hand: right ? Written expression: Impaired: word ? ?MOTOR SPEECH: ?Overall motor speech: impaired ?Level of impairment: Word ?Respiration:  WFL ?Phonation: normal ?Resonance: WFL ?Articulation: Impaired: word ?Intelligibility: Intelligibility reduced ?Motor planning: Impaired: aware and unaware ? ?Interfering components:  N/A ?Effective technique: slow rate; Pt with premorbid history of stutter per family ? ? ?ORAL MOTOR EXAMINATION ?Facial : WFL ?Lingual: WFL ?Velum: WFL ?Mandible: WFL ?Cough: WFL ?Voice: WFL ? ?STANDARDIZED ASSESSMENTS: ?  MAST Pt was administered the [place] (MAST) and achieved an overall expressive score of 18/50, receptive score of 26/50, and a total score of 44/100. ? ?Expressive Index ?Naming 4/10 ?Automatic Speech 8/10 ?Repetition 6/10 ?Writing 0/10 ?Verbal Fluency 0/10 ?Expressive Subscale 18/50 ? ?Receptive Index ?Yes/No Accuracy 18/20 ?Object Recognition 6/10 ?Following Instructions 2/10 ?Reading Instructions 0/10 ?Receptive Subscale 26/50 ? ?Total Index ?Expressive 18/50 ?Receptive 26/50 ?Total Score 44/100 ? ? ? ?PATIENT EDUCATION: ?Education details: Provided information on consideration of alternative communication devices ?Person educated:  Patient and Caregiver ?Education method: Explanation and Handouts ?Education comprehension: verbalized understanding and needs further education ? ? ?GOALS: ?Goals reviewed with patient? Yes ? ?SHORT TERM GOALS: Target date: 07/17/2021 ? ?Pt will provide a gesture for set list of 20 words with 90% effectiveness and mi/mod prompt via modeling from SLP.  ? ?Baseline: 75% max assist  ?Goal status: INITIAL ? ?2.  Pt will participate in a 2-week trial using alternative communication devices to communicate with her family and therapist due to the severity of her aphasia. ?Baseline: Low tech versions only at this time ?Goal status: INITIAL ? ?3.  Pt will increase naming of common objects/pictures to 80% acc when provided with mi/mod cues.  ?Baseline: mod/max cues ?Goal status: INITIAL ? ?4.  Patient will point to objects and object pictures/photos when named in field of 5 with 90% accuracy and cue for error awareness prn.  ?Baseline: 60% ?Goal status: INITIAL ? ?5.  Pt will answer personally relevant questions regarding preferences with 80% acc when provided mi/mod assist and total communication options. ?Baseline: Max assist ?Goal status: INITIAL ? ? ?LONG TERM GOALS: Target date: 08/07/2021 ? ?SLP LONG TERM GOAL #1 ? Pt will communicate basic wants/needs to [facility] via multimodality communication strategies with mi/mod assist from caregivers.  ?Baseline: Max assist  ? ?Goal status: INITIAL ?  ?SLP LONG TERM GOAL #2 ?Pt will increase auditory comprehension for basic level information to [facility] with use of multimodality cues and min assist from caregivers.  ?Baseline: mi/mod assist  ?  ?Goal status: INITIAL ? ? ?ASSESSMENT: ? ?CLINICAL IMPRESSION: ?Patient is a 71 y.o. female who was seen today for a speech/language evaluation due to residual severe aphasia from  her stroke on 12/25/2020. Pt presents with dysnomia, perseverative response interference, impaired initiation, premorbid stutter, impaired phrase repetition, good automatic  speech when provided with initial cueing, good understanding of basic yes/no questions and object identification in field of 4, and difficulty following oral/written directions. Pt was previously give

## 2021-06-05 NOTE — Therapy (Signed)
?OUTPATIENT OCCUPATIONAL THERAPY NEURO EVALUATION ? ?Patient Name: Natasha Vazquez ?MRN: [id number] ?DOB:[birth year], 71 y.o., female ?Today's Date: 06/05/2021 ? ?PCP: Carol Ada, MD ?REFERRING PROVIDER: Carol Ada, MD ? ? ? ?Past Medical History:  ?Diagnosis Date  ? Hypertension   ? TMJ (dislocation of temporomandibular joint)   ? ?Past Surgical History:  ?Procedure Laterality Date  ? ABDOMINAL HYSTERECTOMY    ? CHOLECYSTECTOMY    ? IR ANGIO INTRA EXTRACRAN SEL INTERNAL CAROTID BILAT MOD SED  09/16/2016  ? IR ANGIO VERTEBRAL SEL VERTEBRAL BILAT MOD SED  09/16/2016  ? MASS EXCISION Left 02/10/2020  ? Procedure: excision of soft tissue mass left upper back;  Surgeon: Jesusita Oka, MD;  Location: [place];  Service: General;  Laterality: Left;  ? ?Patient Active Problem List  ? Diagnosis Date Noted  ? 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[place]) 05/07/2021  ? Aphasia as late effect of cerebrovascular accident 04/24/2021  ? 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 Decreased estrogen level 04/24/2021  ? Diminished vision 04/24/2021  ? Heart murmur 04/24/2021  ? Lipoma 04/24/2021  ? Pure hypercholesterolemia 04/24/2021  ? Slow transit constipation   ? Dyslipidemia   ? 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 Chronic heart failure with preserved ejection fraction (HFpEF) ([place]) 01/02/2021  ? Acute ischemic stroke ([place]) 12/26/2020  ? Essential hypertension 12/26/2020  ? Mixed hyperlipidemia 12/26/2020  ? Acute ischemic left MCA stroke ([place]) 12/26/2020  ? ? ?ONSET DATE: 12/25/20 ? ?REFERRING DIAG: S/P CVA ? ?THERAPY DIAG:  ?Other lack of coordination ?  ?Other symptoms and signs involving the nervous system ? ?SUBJECTIVE:  ? ?SUBJECTIVE STATEMENT: ?S: Granddaughter reports some confusion regarding previous discharge.  ?Pt accompanied by: family member (Granddaughter: Merleen Nicely) ? ?PERTINENT HISTORY: Pt is a 71 y/o female s/p left MCA CVA on  12/25/20 presenting with right hemiplegia and decreased functional use of dominant RUE for ADLs. Patient received outpatient OT services at this clinic 01/24/21- 04/23/21.  During that time, Pt met 3/4 STGs and 1/4 LTG with an additional STG and LTG partially met.  Throughout her time in therapy pt had progressed from flaccid RUE to performing A/ROM throughout with shoulder being at 50% ROM and strength being fair to good throughout. Due to plateau in progress and increasing tone in the RUE which affected progressing A/ROM patient was discharged and encouraged to discuss options for medical management of tone and returning to OT when appropriate with new referral. She was discharge due to lack of progress since last progress note 4 weeks prior.  ? ?PRECAUTIONS: Other: right side hemiparesis ? ?WEIGHT BEARING RESTRICTIONS No ? ?PAIN:  ?Are you having pain? No ? ?FALLS: Has patient fallen in last 6 months? No ? ?LIVING ENVIRONMENT: ?Lives with: lives with their family ? ? ?PLOF: Independent prior to CVA ? ?PATIENT GOALS to increase use of her RUE. ? ?OBJECTIVE:  ? ?HAND DOMINANCE: Right ? ?ADLs: ?Overall ADLs: Completes basic ADL tasks with min assist if needed due to right UE hemiparesis. ? ? ? ? ? ?MOBILITY STATUS: Independent ? ?POSTURE COMMENTS:  ?No Significant postural limitations ? ? ? ? ?UE ROM    ? ?Active ROM Right ?06/05/2021  ?Shoulder flexion 85  ?Shoulder abduction 42  ?Shoulder internal rotation 90  ?Shoulder external rotation 40  ?Elbow flexion WFL  ?Elbow extension WFL  ?Wrist flexion 56  ?Wrist extension 42  ?Wrist ulnar deviation 22  ?Wrist radial  deviation 20  ?Wrist pronation 90  ?Wrist supination 76  ?(Blank rows = not tested) ? ? ?UE MMT:    ? ?MMT Right ?06/05/2021  ?Shoulder flexion 3-/5  ?Shoulder abduction 3-/5  ?Shoulder internal rotation 3/5  ?Shoulder external rotation 3/5  ?Elbow flexion 3/5  ?Elbow extension 3/5  ?Wrist flexion 3/5  ?Wrist extension 3/5  ?Wrist ulnar deviation 3/5  ?Wrist  radial deviation 3/5  ?Wrist pronation 3/5  ?Wrist supination 3/5  ?(Blank rows = not tested) ? ?HAND FUNCTION: ?Grip strength: Right: 0 lbs; (dial reaches the line on the top of box that 0 is in. No number reached though). Previous: same ?Lateral pinch: right: 1 # (previous: 2#) ?3 point pinch: right: 1# (previous: 1#) ? ?COORDINATION: ?Unable to test coordination due to lack of functional active movement in fingers needing to perform 9 hole peg test. ? ?SENSATION: ?WFL ? ?EDEMA: Slight edema noted in right hand and fingers. ? ?MUSCLE TONE: RUE: Moderate and Hypertonic Effects mainly shoulder, wrist and fingers. ? ?COGNITION: ?Overall cognitive status:  expressive and receptive aphasia. ? ?VISION: ?Subjective report: No vision deficits noted during evaluation via observation. ?Baseline vision: Wears glasses all the time ?Visual history: macular degeneration ? ?VISION ASSESSMENT: ?Not tested ? ? ?PERCEPTION: WFL ? ?PRAXIS: WFL ? ? ? ? ?TODAY'S TREATMENT:  ?Discussed patient's follow up appointment with both Rehab MD and Neurologist. Reason for OT discharge at appointment on 05/03/21 with recommendation to discuss medical management of tone.   ? ? ?PATIENT EDUCATION: ?Education details: Will reach out to Neurologist to discuss needing medical management of RUE tone and inquire about Botox. Increased tone is impeding progress in therapy and needs to be addressed. Encouraged purchasing a night time hand brace to provide composite finger extension. Pulled up similar braces that can be purchased via [employer] with measurements taken for correct size. OT will reach out to Granddaughter with update from Neurologist on next step.  ?Person educated: Patient and Granddaughter ?Education method: Explanation ?Education comprehension: verbalized understanding ? ? ?HOME EXERCISE PROGRAM: ?N/A ? ? ? ?GOALS: TBD ? ? ?ASSESSMENT: ? ?CLINICAL IMPRESSION: ?Patient is a 71 y.o. female who was seen today for occupational therapy evaluation  for Right side hemiparesis due to Left CVA causing increased tone effecting the shoulder, wrist and fingers, decreased strength, ROM, coordination resulting in increased difficulty utilizing her RUE to help complete basic ADL tasks. Due to increased RUE tone, patient is unable to progress in therapy and at previous appointment it was recommended to discuss medical management with MD at follow up appointment. Discussion did not happen. OT will contact Neurologist to inquire if Botox is able to be performed that their clinic. Will contact Granddaughter, Merleen Nicely with update via phone [phone] Cell)  ? ?PERFORMANCE DEFICITS in functional skills including ADLs, IADLs, coordination, tone, ROM, strength, pain, [place], GMC, and UE functional use. ? ?IMPAIRMENTS are limiting patient from ADLs, IADLs, and leisure.  ? ?COMORBIDITIES may have co-morbidities  that affects occupational performance. Patient will benefit from skilled OT to address above impairments and improve overall function. ? ?MODIFICATION OR ASSISTANCE TO COMPLETE EVALUATION: Min-Moderate modification of tasks or assist with assess necessary to complete an evaluation. ? ?OT OCCUPATIONAL PROFILE AND HISTORY: Detailed assessment: Review of records and additional review of physical, cognitive, psychosocial history related to current functional performance. ? ?CLINICAL DECISION MAKING: Moderate - several treatment options, min-mod task modification necessary ? ?REHAB POTENTIAL: Good ? ?EVALUATION COMPLEXITY: Moderate ? ? ? ?PLAN: ?OT FREQUENCY:  TBD ? ?  OT DURATION: other: TBD ? ?PLANNED INTERVENTIONS:  TBD ? ?RECOMMENDED OTHER SERVICES: Medical management for RUE increased tone  ? ?CONSULTED AND AGREED WITH PLAN OF CARE: Patient and family member/caregiver ? ?PLAN FOR NEXT SESSION: P: OT to contact Neurologist with Botox option to help manage and decreased increased RUE tone in order to return to OT and continue to progress.  ? ? ?Ailene Ravel, OTR/L,CBIS   ?[phone] ? ?06/05/2021, 11:28 AM ? ? ? ? ? ? ? ?   ?

## 2021-06-07 ENCOUNTER — Encounter (HOSPITAL_COMMUNITY): Payer: Medicare Other | Admitting: Occupational Therapy

## 2021-06-13 ENCOUNTER — Telehealth (HOSPITAL_COMMUNITY): Payer: Self-pay

## 2021-06-13 NOTE — Telephone Encounter (Signed)
Left message for Merleen Nicely (Granddaughter) to inform her to call and schedule an appointment with Dr. Dianna Limbo to assess the tone and determine if Botox is recommended. ? ?Ailene Ravel, OTR/L,CBIS  ?[phone] ? ?

## 2021-06-24 ENCOUNTER — Encounter (HOSPITAL_COMMUNITY): Payer: Self-pay | Admitting: Speech Pathology

## 2021-06-24 ENCOUNTER — Ambulatory Visit (HOSPITAL_COMMUNITY): Payer: Medicare Other | Admitting: Speech Pathology

## 2021-06-24 DIAGNOSIS — R29818 Other symptoms and signs involving the nervous system: Secondary | ICD-10-CM | POA: Diagnosis not present

## 2021-06-24 DIAGNOSIS — R278 Other lack of coordination: Secondary | ICD-10-CM | POA: Diagnosis not present

## 2021-06-24 DIAGNOSIS — I69890 Apraxia following other cerebrovascular disease: Secondary | ICD-10-CM | POA: Diagnosis not present

## 2021-06-24 DIAGNOSIS — R4701 Aphasia: Secondary | ICD-10-CM

## 2021-06-24 NOTE — Therapy (Signed)
OUTPATIENT SPEECH LANGUAGE PATHOLOGY APHASIA EVALUATION   Patient Name: Natasha Vazquez MRN: [id number] DOB:01-16-1951, 71 y.o., female Today's Date: 06/24/2021  PCP: Natasha Ada, MD REFERRING PROVIDER: Carol Ada, MD   End of Session - 06/24/21 1655     Visit Number 2    Number of Visits 8    Date for SLP Re-Evaluation 07/25/21    Authorization Type UHC Medicare    Progress Note Due on Visit --   10th visit   SLP Start Time 0900    SLP Stop Time  0945    SLP Time Calculation (min) 45 min    Activity Tolerance Patient tolerated treatment well             Past Medical History:  Diagnosis Date   Hypertension    TMJ (dislocation of temporomandibular joint)    Past Surgical History:  Procedure Laterality Date   ABDOMINAL HYSTERECTOMY     CHOLECYSTECTOMY     IR ANGIO INTRA EXTRACRAN SEL INTERNAL CAROTID BILAT MOD SED  09/16/2016   IR ANGIO VERTEBRAL SEL VERTEBRAL BILAT MOD SED  09/16/2016   MASS EXCISION Left 02/10/2020   Procedure: excision of soft tissue mass left upper back;  Surgeon: Jesusita Oka, MD;  Location: [place];  Service: General;  Laterality: Left;   Patient Active Problem List   Diagnosis Date Noted   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[place]) 05/07/2021   Aphasia as late effect of cerebrovascular accident 04/24/2021   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 ([place]) 04/24/2021   Decreased estrogen level 04/24/2021   Diminished vision 04/24/2021   Heart murmur 04/24/2021   Lipoma 04/24/2021   Pure hypercholesterolemia 04/24/2021   Slow transit constipation    Dyslipidemia    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   Chronic heart failure with preserved ejection fraction (HFpEF) ([place]) 01/02/2021   Acute ischemic stroke ([place]) 12/26/2020   Essential hypertension 12/26/2020   Mixed hyperlipidemia 12/26/2020   Acute ischemic left MCA stroke ([place]) 12/26/2020    ONSET DATE:  12/25/2020   REFERRING DIAG: Aphasia as late effect from CVA  THERAPY DIAG:  Aphasia  Apraxia following other cerebrovascular disease  SUBJECTIVE:   SUBJECTIVE STATEMENT: "One, two" Pt accompanied by: self and family member  PERTINENT HISTORY: Natasha Vazquez is a 71 yo female who was referred for speech/language evaluation and treatment by her PCP, Natasha Ada, MD. 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 She was discharged from [facility] inpatient rehab on 01/15/21 and seen for outpatient SLP therapy from 01/24/21-03/26/21. She was discharged from SLP services at the request from the patient and her family due to Pt feeling frustrated about her aphasia. She made slow, but good progress during outpatient SLP therapy with [place] Aphasia Screening Tool (MAST) scores improving from 2/50 to 15/50 in Expressive portion and 26/50 to 28/50 for the Receptive portion.   PAIN:  Are you having pain? No  FALLS: Has patient fallen in last 6 months?  No  LIVING ENVIRONMENT: Lives with: lives with their family Lives in: House/apartment  PLOF:  Level of assistance: Needed assistance with ADLs Employment: Retired   PATIENT GOALS Improve speech  OBJECTIVE:   DIAGNOSTIC FINDINGS: See MAST below  COGNITION: Overall cognitive status: History of cognitive impairments - at baseline and due to severity of aphasia Areas of impairment: Memory and Following commands Comments: Higher level executive functioning deficits noted due  to severity of aphasia  AUDITORY COMPREHENSION: Overall auditory comprehension: Impaired: moderately complex YES/NO questions: Impaired: moderately complex Following directions: Impaired: moderately complex Conversation: Simple Interfering components: processing speed and working memory Effective technique: extra processing time, pausing, repetition/stressing words, written cues, stressing words, and  visual/gestural cues   READING COMPREHENSION: Impaired: word  EXPRESSION: verbal  VERBAL EXPRESSION: Level of generative/spontaneous verbalization: word Automatic speech: name: impaired, social response: impaired, counting: impaired, and day of week: Requires initiation cues   Repetition:  Pt able to repeat some single words Naming: Responsive: 26-50%, Confrontation: 26-50%, and Divergent: 26-50% Pragmatics: Appears intact Comments: Relative strength in automatic speech task and phonemic/visual cues Interfering components:  Effective technique: sentence completion, phonemic cues, written cues, articulatory cues, and melodic intonation Non-verbal means of communication: gestures and writing  WRITTEN EXPRESSION: Dominant hand: right  Written expression: Impaired: word  MOTOR SPEECH: Overall motor speech: impaired Level of impairment: Word Respiration:  WFL Phonation: normal Resonance: WFL Articulation: Impaired: word Intelligibility: Intelligibility reduced Motor planning: Impaired: aware and unaware  Interfering components:  N/A Effective technique: slow rate; Pt with premorbid history of stutter per family   ORAL MOTOR EXAMINATION Facial : WFL Lingual: WFL Velum: WFL Mandible: WFL Cough: WFL Voice: WFL  STANDARDIZED ASSESSMENTS:   MAST Pt was administered the [place] Aphasia Screening Tool (MAST) and achieved an overall expressive score of 18/50, receptive score of 26/50, and a total score of 44/100.  Expressive Index Naming 4/10 Automatic Speech 8/10 Repetition 6/10 Writing 0/10 Verbal Fluency 0/10 Expressive Subscale 18/50  Receptive Index Yes/No Accuracy 18/20 Object Recognition 6/10 Following Instructions 2/10 Reading Instructions 0/10 Receptive Subscale 26/50  Total Index Expressive 18/50 Receptive 26/50 Total Score 44/100    PATIENT EDUCATION: Education details: Provided information on consideration of alternative communication  devices Person educated: Patient and Caregiver Education method: Explanation and Handouts Education comprehension: verbalized understanding and needs further education   GOALS: Goals reviewed with patient? Yes  SHORT TERM GOALS: Target date: 07/29/2021  Pt will provide a gesture for set list of 20 words with 90% effectiveness and mi/mod prompt via modeling from SLP.   Baseline: 75% max assist  Goal status: INITIAL  2.  Pt will participate in a 2-week trial using alternative communication devices to communicate with her family and therapist due to the severity of her aphasia. Baseline: Low tech versions only at this time Goal status: INITIAL  3.  Pt will increase naming of common objects/pictures to 80% acc when provided with mi/mod cues.  Baseline: mod/max cues Goal status: INITIAL  4.  Patient will point to objects and object pictures/photos when named in field of 5 with 90% accuracy and cue for error awareness prn.  Baseline: 60% Goal status: INITIAL  5.  Pt will answer personally relevant questions regarding preferences with 80% acc when provided mi/mod assist and total communication options. Baseline: Max assist Goal status: INITIAL   LONG TERM GOALS: Target date: 08/19/2021  SLP LONG TERM GOAL #1  Pt will communicate basic wants/needs to [facility] via multimodality communication strategies with mi/mod assist from caregivers.  Baseline: Max assist   Goal status: INITIAL   SLP LONG TERM GOAL #2 Pt will increase auditory comprehension for basic level information to [facility] with use of multimodality cues and min assist from caregivers.  Baseline: mi/mod assist    Goal status: INITIAL   ASSESSMENT:  CLINICAL IMPRESSION: Patient is a 71 y.o. female who was seen today for a speech/language evaluation due to residual severe aphasia from  her stroke on 12/25/2020. Pt presents with dysnomia, perseverative response interference, impaired initiation, premorbid stutter, impaired phrase  repetition, good automatic speech when provided with initial cueing, good understanding of basic yes/no questions and object identification in field of 4, and difficulty following oral/written directions. Pt was previously given a communication template filled out with personally relevant vocabulary, however her family members indicate that they primarily communicate with her by asking her yes/no questions and pointing which is laborious, frustrating, and ineffective to both the patient and family. Pt benefits from multimodality communication strategies to elicit word productions (including melodic intonation, phrase completion, modeling, gestures, breaking perseverative responses, and written cues). Pt and family would like for Pt to participate in SLP therapy again to improve communication skills. They are open to exploring alternative communication options as well.   OBJECTIVE IMPAIRMENTS include executive functioning, expressive language, receptive language, and aphasia. These impairments are limiting patient from managing medications, managing appointments, managing finances, and effectively communicating at home and in community. Factors affecting potential to achieve goals and functional outcome are ability to learn/carryover information, cooperation/participation level, and severity of impairments. Patient will benefit from skilled SLP services to address above impairments and improve overall function.  REHAB POTENTIAL: Fair Pt with severe aphasia  PLAN: SLP FREQUENCY: 2x/week  SLP DURATION: 6 weeks  PLANNED INTERVENTIONS: Cueing hierachy, Cognitive reorganization, Internal/external aids, Functional tasks, Multimodal communication approach, SLP instruction and feedback, Compensatory strategies, Patient/family education, and trial of alternative communication systems   Thank you,  Genene Churn, [place]  [place], [place] 06/24/2021, 4:57 PM

## 2021-06-26 ENCOUNTER — Ambulatory Visit (HOSPITAL_COMMUNITY): Payer: Medicare Other | Admitting: Speech Pathology

## 2021-06-26 ENCOUNTER — Encounter (HOSPITAL_COMMUNITY): Payer: Self-pay | Admitting: Speech Pathology

## 2021-06-26 DIAGNOSIS — R278 Other lack of coordination: Secondary | ICD-10-CM | POA: Diagnosis not present

## 2021-06-26 DIAGNOSIS — R4701 Aphasia: Secondary | ICD-10-CM | POA: Diagnosis not present

## 2021-06-26 DIAGNOSIS — I69890 Apraxia following other cerebrovascular disease: Secondary | ICD-10-CM | POA: Diagnosis not present

## 2021-06-26 DIAGNOSIS — R29818 Other symptoms and signs involving the nervous system: Secondary | ICD-10-CM | POA: Diagnosis not present

## 2021-06-26 NOTE — Therapy (Signed)
OUTPATIENT SPEECH LANGUAGE PATHOLOGY TREATMENT NOTE   Patient Name: Natasha Vazquez MRN: [id number] DOB:20-Aug-1950, 71 y.o., female Today's Date: 06/26/2021  PCP: Carol Ada, MD REFERRING PROVIDER: Carol Ada, MD  ONSET DATE: 12/25/2020    REFERRING DIAG: Aphasia as late effect from CVA   THERAPY DIAG:  Aphasia   Apraxia following other cerebrovascular disease  PERTINENT HISTORY: Natasha Vazquez is a 71 yo female who was referred for speech/language evaluation and treatment by her PCP, Carol Ada, MD. 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 She was discharged from [facility] inpatient rehab on 01/15/21 and seen for outpatient SLP therapy from 01/24/21-03/26/21. She was discharged from SLP services at the request from the patient and her family due to Pt feeling frustrated about her aphasia. She made slow, but good progress during outpatient SLP therapy with [place] Aphasia Screening Tool (MAST) scores improving from 2/50 to 15/50 in Expressive portion and 26/50 to 28/50 for the Receptive portion.   END OF SESSION:   End of Session - 06/26/21 1027     Visit Number 3    Number of Visits 8    Date for SLP Re-Evaluation 07/25/21    Authorization Type UHC Medicare    Progress Note Due on Visit --   10th visit   SLP Start Time 0900    SLP Stop Time  0950    SLP Time Calculation (min) 50 min    Activity Tolerance Patient tolerated treatment well             Past Medical History:  Diagnosis Date   Hypertension    TMJ (dislocation of temporomandibular joint)    Past Surgical History:  Procedure Laterality Date   ABDOMINAL HYSTERECTOMY     CHOLECYSTECTOMY     IR ANGIO INTRA EXTRACRAN SEL INTERNAL CAROTID BILAT MOD SED  09/16/2016   IR ANGIO VERTEBRAL SEL VERTEBRAL BILAT MOD SED  09/16/2016   MASS EXCISION Left 02/10/2020   Procedure: excision of soft tissue mass left upper back;  Surgeon: Jesusita Oka, MD;   Location: [place];  Service: General;  Laterality: Left;   Patient Active Problem List   Diagnosis Date Noted   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[place]) 05/07/2021   Aphasia as late effect of cerebrovascular accident 04/24/2021   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 ([place]) 04/24/2021   Decreased estrogen level 04/24/2021   Diminished vision 04/24/2021   Heart murmur 04/24/2021   Lipoma 04/24/2021   Pure hypercholesterolemia 04/24/2021   Slow transit constipation    Dyslipidemia    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 {yes/no:20286} NPRS scale: ***/10 Pain location: *** Pain orientation: {Pain Orientation:25161}  PAIN TYPE: {type:313116} Pain description: {PAIN DESCRIPTION:21022940}  Aggravating factors: *** Relieving factors: ***    GOALS: Goals reviewed with patient? Yes   SHORT TERM GOALS: Target date: 07/17/2021   Pt will provide a gesture for set list of 20 words with 90% effectiveness and mi/mod prompt via modeling from SLP.    Baseline: 75% max assist  Goal status: Ongoing   2.  Pt will participate in a 2-week trial using alternative communication devices to communicate with her family and therapist due to the severity of her aphasia. Baseline: Low tech versions only at this time Goal status: Ongoing   3.  Pt will increase naming of common objects/pictures to 80% acc when provided with mi/mod cues.  Baseline: mod/max cues Goal status: Ongoing   4.  Patient will point to objects and object pictures/photos when named in field of 5 with 90%  accuracy and cue for error awareness prn.  Baseline: 60% Goal status: Ongoing   5.  Pt will answer personally relevant questions regarding preferences with 80% acc when provided mi/mod assist and total communication options. Baseline: Max assist Goal status: Ongoing     LONG TERM GOALS: Target date: 08/07/2021   SLP LONG TERM GOAL #1  Pt will communicate basic wants/needs to [facility] via multimodality communication strategies with mi/mod assist from caregivers.  Baseline: Max assist    Goal status: Ongoing   SLP LONG TERM GOAL #2 Pt will increase auditory comprehension for basic level information to [facility] with use of multimodality cues and min assist from caregivers.  Baseline: mi/mod assist    Goal status: Ongoing   ASSESSMENT:   CLINICAL IMPRESSION: Patient is a 71 y.o. female who was seen today for a speech/language evaluation due to residual severe aphasia from her stroke on 12/25/2020. Pt presents with dysnomia, perseverative response interference, impaired initiation, premorbid stutter, impaired phrase repetition, good automatic speech when provided with initial cueing, good understanding of basic yes/no questions and object identification in field of 4, and difficulty following oral/written directions. Pt was previously given a communication template filled out with personally relevant vocabulary, however her family members indicate that they primarily communicate with her by asking her yes/no questions and pointing which is laborious, frustrating, and ineffective to both the patient and family. Pt benefits from multimodality communication strategies to elicit word productions (including melodic intonation, phrase completion, modeling, gestures, breaking perseverative responses, and written cues). Pt and family would like for Pt to participate in SLP therapy again to improve communication skills. They are open to exploring alternative communication options as well.    OBJECTIVE IMPAIRMENTS  include executive functioning, expressive language, receptive language, and aphasia. These impairments are limiting patient from managing medications, managing appointments, managing finances, and effectively communicating at home and in community. Factors affecting potential to achieve goals and functional outcome are ability to learn/carryover information, cooperation/participation level, and severity of impairments. Patient will benefit from skilled SLP services to address above impairments and improve overall function.   REHAB POTENTIAL: Fair Pt with severe aphasia   PLAN: SLP FREQUENCY: 2x/week   SLP DURATION: 6 weeks   PLANNED INTERVENTIONS: Cueing hierachy, Cognitive reorganization, Internal/external aids, Functional tasks, Multimodal communication approach, SLP instruction and feedback, Compensatory strategies, Patient/family education, and trial of alternative communication systems OBJECTIVE:   TODAY'S TREATMENT: ***  (Copy Today's treatment to Plan section here)  Thank you,  Genene Churn, [place]  [place], [place] 06/26/2021, 12:47 PM

## 2021-07-04 ENCOUNTER — Encounter (HOSPITAL_COMMUNITY): Payer: Self-pay | Admitting: Speech Pathology

## 2021-07-04 ENCOUNTER — Ambulatory Visit (HOSPITAL_COMMUNITY): Payer: Medicare Other | Attending: Physician Assistant | Admitting: Speech Pathology

## 2021-07-04 DIAGNOSIS — R4701 Aphasia: Secondary | ICD-10-CM | POA: Diagnosis not present

## 2021-07-04 DIAGNOSIS — I69890 Apraxia following other cerebrovascular disease: Secondary | ICD-10-CM | POA: Insufficient documentation

## 2021-07-04 NOTE — Therapy (Signed)
OUTPATIENT SPEECH LANGUAGE PATHOLOGY TREATMENT NOTE   Patient Name: Natasha Vazquez MRN: [id number] DOB:[birth year], 71 y.o., female Today's Date: 07/04/2021  PCP: Carol Ada, MD REFERRING PROVIDER: Carol Ada, MD  ONSET DATE: 12/25/2020    REFERRING DIAG: Aphasia as late effect from CVA   THERAPY DIAG:  Aphasia   Apraxia following other cerebrovascular disease  PERTINENT HISTORY: Natasha Vazquez is a 71 yo female who was referred for speech/language evaluation and treatment by her PCP, Carol Ada, MD. 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 She was discharged from [facility] inpatient rehab on 01/15/21 and seen for outpatient SLP therapy from 01/24/21-03/26/21. She was discharged from SLP services at the request from the patient and her family due to Pt feeling frustrated about her aphasia. She made slow, but good progress during outpatient SLP therapy with [place] Aphasia Screening Tool (MAST) scores improving from 2/50 to 15/50 in Expressive portion and 26/50 to 28/50 for the Receptive portion.   END OF SESSION:   End of Session - 07/04/21 1704     Visit Number 4    Number of Visits 8    Date for SLP Re-Evaluation 07/25/21    Authorization Type UHC Medicare    Progress Note Due on Visit --   10th visit   SLP Start Time 0900    SLP Stop Time  0945    SLP Time Calculation (min) 45 min    Activity Tolerance Patient tolerated treatment well             Past Medical History:  Diagnosis Date   Hypertension    TMJ (dislocation of temporomandibular joint)    Past Surgical History:  Procedure Laterality Date   ABDOMINAL HYSTERECTOMY     CHOLECYSTECTOMY     IR ANGIO INTRA EXTRACRAN SEL INTERNAL CAROTID BILAT MOD SED  09/16/2016   IR ANGIO VERTEBRAL SEL VERTEBRAL BILAT MOD SED  09/16/2016   MASS EXCISION Left 02/10/2020   Procedure: excision of soft tissue mass left upper back;  Surgeon: Jesusita Oka, MD;   Location: [place];  Service: General;  Laterality: Left;   Patient Active Problem List   Diagnosis Date Noted   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[place]) 05/07/2021   Aphasia as late effect of cerebrovascular accident 04/24/2021   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 ([place]) 04/24/2021   Decreased estrogen level 04/24/2021   Diminished vision 04/24/2021   Heart murmur 04/24/2021   Lipoma 04/24/2021   Pure hypercholesterolemia 04/24/2021   Slow transit constipation    Dyslipidemia    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 No    GOALS: Goals reviewed with patient? Yes   SHORT TERM GOALS: Target date: 07/17/2021   Pt will provide a gesture for set list of 20 words with 90% effectiveness and mi/mod prompt via modeling from SLP.    Baseline: 75% max assist  Goal status: Ongoing   2.  Pt will participate in a 2-week trial using alternative communication devices to communicate with her family and therapist due to the severity of her  aphasia. Baseline: Low tech versions only at this time Goal status: Ongoing   3.  Pt will increase naming of common objects/pictures to 80% acc when provided with mi/mod cues.  Baseline: mod/max cues Goal status: Ongoing   4.  Patient will point to objects and object pictures/photos when named in field of 5 with 90% accuracy and cue for error awareness prn.  Baseline: 60% Goal status: Ongoing   5.  Pt will answer personally relevant questions regarding preferences with 80% acc when provided mi/mod assist and total  communication options. Baseline: Max assist Goal status: Ongoing     LONG TERM GOALS: Target date: 08/07/2021   SLP LONG TERM GOAL #1  Pt will communicate basic wants/needs to [facility] via multimodality communication strategies with mi/mod assist from caregivers.  Baseline: Max assist    Goal status: Ongoing   SLP LONG TERM GOAL #2 Pt will increase auditory comprehension for basic level information to [facility] with use of multimodality cues and min assist from caregivers.  Baseline: mi/mod assist    Goal status: Ongoing   ASSESSMENT:   CLINICAL IMPRESSION: Patient is a 71 y.o. female who was seen today for a speech/language evaluation due to residual severe aphasia from her stroke on 12/25/2020. Pt presents with dysnomia, perseverative response interference, impaired initiation, premorbid stutter, impaired phrase repetition, good automatic speech when provided with initial cueing, good understanding of basic yes/no questions and object identification in field of 4, and difficulty following oral/written directions. Pt was previously given a communication template filled out with personally relevant vocabulary, however her family members indicate that they primarily communicate with her by asking her yes/no questions and pointing which is laborious, frustrating, and ineffective to both the patient and family. Pt benefits from multimodality communication strategies to elicit word productions (including melodic intonation, phrase completion, modeling, gestures, breaking perseverative responses, and written cues). Pt and family would like for Pt to participate in SLP therapy again to improve communication skills. They are open to exploring alternative communication options as well.    OBJECTIVE IMPAIRMENTS include executive functioning, expressive language, receptive language, and aphasia. These impairments are limiting patient from managing medications, managing appointments, managing finances, and  effectively communicating at home and in community. Factors affecting potential to achieve goals and functional outcome are ability to learn/carryover information, cooperation/participation level, and severity of impairments. Patient will benefit from skilled SLP services to address above impairments and improve overall function.   REHAB POTENTIAL: Fair Pt with severe expressive aphasia   PLAN: SLP FREQUENCY: 2x/week   SLP DURATION: 6 weeks   PLANNED INTERVENTIONS: Cueing hierachy, Cognitive reorganization, Internal/external aids, Functional tasks, Multimodal communication approach, SLP instruction and feedback, Compensatory strategies, Patient/family education, and trial of alternative communication systems OBJECTIVE:   TODAY'S TREATMENT: Pt accompanied to therapy with her granddaughter. Part of the session was spent on reviewing the [place] with a [place] representative for device navigation and personalization. Pt was able to select icons with mod assist for scanning. She was able to answer basic personal/bio questions related to birthday, address, and family members with min assist. She attempted to repeat responses verbally with excellent approximations. Pt seemed motivated and excited to have a new way to communicate. Continue with use over next few sessions.   Thank you,  Genene Churn, [place]  [place], [place] 07/04/2021, 5:09 PM

## 2021-07-08 ENCOUNTER — Ambulatory Visit (HOSPITAL_COMMUNITY): Payer: TRICARE For Life (TFL) | Admitting: Speech Pathology

## 2021-07-10 ENCOUNTER — Encounter (HOSPITAL_COMMUNITY): Payer: Self-pay | Admitting: Speech Pathology

## 2021-07-10 ENCOUNTER — Ambulatory Visit (HOSPITAL_COMMUNITY): Payer: Medicare Other | Attending: Physician Assistant | Admitting: Speech Pathology

## 2021-07-10 DIAGNOSIS — I69890 Apraxia following other cerebrovascular disease: Secondary | ICD-10-CM | POA: Insufficient documentation

## 2021-07-10 DIAGNOSIS — R4701 Aphasia: Secondary | ICD-10-CM | POA: Insufficient documentation

## 2021-07-10 DIAGNOSIS — R41841 Cognitive communication deficit: Secondary | ICD-10-CM | POA: Insufficient documentation

## 2021-07-10 NOTE — Therapy (Signed)
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 She was discharged from [facility] inpatient rehab on 01/15/21 and seen for outpatient SLP therapy from 01/24/21-03/26/21. She was discharged from SLP services at the request from the patient and her family due to Pt feeling frustrated about her aphasia. She made slow, but good progress during outpatient SLP therapy with [place] Aphasia Screening Tool (MAST) scores improving from 2/50 to 15/50 in Expressive portion and 26/50 to 28/50 for the Receptive portion.   END OF SESSION:   End of Session - 07/10/21 1014     Visit Number 5    Number of Visits 8    Date for SLP Re-Evaluation 07/25/21    Authorization Type [employer] Medicare    Progress Note Due on Visit --   10th visit   SLP Start Time 0906    SLP Stop Time  [id number]    SLP Time Calculation (min) 46 min    Activity Tolerance Patient tolerated treatment well             Past Medical History:  Diagnosis Date   Hypertension    TMJ (dislocation of temporomandibular joint)    Past Surgical History:  Procedure Laterality Date   ABDOMINAL HYSTERECTOMY     CHOLECYSTECTOMY     IR ANGIO INTRA EXTRACRAN SEL INTERNAL CAROTID BILAT MOD SED  09/16/2016   IR ANGIO VERTEBRAL SEL VERTEBRAL BILAT MOD SED  09/16/2016   MASS EXCISION Left 02/10/2020   Procedure: excision of soft tissue mass left upper back;  Surgeon: Jesusita Oka, MD;   Location: [place];  Service: General;  Laterality: Left;   Patient Active Problem List   Diagnosis Date Noted   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[place]) 05/07/2021   Aphasia as late effect of cerebrovascular accident 04/24/2021   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 ([place]) 04/24/2021   Decreased estrogen level 04/24/2021   Diminished vision 04/24/2021   Heart murmur 04/24/2021   Lipoma 04/24/2021   Pure hypercholesterolemia 04/24/2021   Slow transit constipation    Dyslipidemia    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 No    GOALS: Goals reviewed with patient? Yes   SHORT TERM GOALS: Target date: 07/17/2021   Pt will provide a gesture for set list of 20 words with 90% effectiveness and mi/mod prompt via modeling from SLP.    Baseline: 75% max assist  Goal status: Ongoing   2.  Pt will participate in a 2-week trial using alternative communication devices to communicate with her family and therapist due to the severity of her  aphasia. Baseline: Low tech versions only at this time Goal status: Ongoing   3.  Pt will increase naming of common objects/pictures to 80% acc when provided with mi/mod cues.  Baseline: mod/max cues Goal status: Ongoing   4.  Patient will point to objects and object pictures/photos when named in field of 5 with 90% accuracy and cue for error awareness prn.  Baseline: 60% Goal status: Ongoing   5.  Pt will answer personally relevant questions regarding preferences with 80% acc when provided mi/mod assist and total  communication options. Baseline: Max assist Goal status: Ongoing     LONG TERM GOALS: Target date: 08/07/2021   SLP LONG TERM GOAL #1  Pt will communicate basic wants/needs to [facility] via multimodality communication strategies with mi/mod assist from caregivers.  Baseline: Max assist    Goal status: Ongoing   SLP LONG TERM GOAL #2 Pt will increase auditory comprehension for basic level information to [facility] with use of multimodality cues and min assist from caregivers.  Baseline: mi/mod assist    Goal status: Ongoing   ASSESSMENT:   CLINICAL IMPRESSION: Patient is a 70 y.o. female who was seen today for a speech/language evaluation due to residual severe aphasia from her stroke on 12/25/2020. Pt presents with dysnomia, perseverative response interference, impaired initiation, premorbid stutter, impaired phrase repetition, good automatic speech when provided with initial cueing, good understanding of basic yes/no questions and object identification in field of 4, and difficulty following oral/written directions. Pt was previously given a communication template filled out with personally relevant vocabulary, however her family members indicate that they primarily communicate with her by asking her yes/no questions and pointing which is laborious, frustrating, and ineffective to both the patient and family. Pt benefits from multimodality communication strategies to elicit word productions (including melodic intonation, phrase completion, modeling, gestures, breaking perseverative responses, and written cues). Pt and family would like for Pt to participate in SLP therapy again to improve communication skills. They are open to exploring alternative communication options as well.    OBJECTIVE IMPAIRMENTS include executive functioning, expressive language, receptive language, and aphasia. These impairments are limiting patient from managing medications, managing appointments, managing finances, and  effectively communicating at home and in community. Factors affecting potential to achieve goals and functional outcome are ability to learn/carryover information, cooperation/participation level, and severity of impairments. Patient will benefit from skilled SLP services to address above impairments and improve overall function.   REHAB POTENTIAL: Fair Pt with severe expressive aphasia   PLAN: SLP FREQUENCY: 2x/week   SLP DURATION: 6 weeks   PLANNED INTERVENTIONS: Cueing hierachy, Cognitive reorganization, Internal/external aids, Functional tasks, Multimodal communication approach, SLP instruction and feedback, Compensatory strategies, Patient/family education, and trial of alternative communication systems OBJECTIVE:   TODAY'S TREATMENT: Pt accompanied to therapy with her granddaughter. The Lingraphica device was further personalized with assist from SLP to include Pt interests. Pt was able to use the device to answer questions about how many plants she has indoor/outdoor, types of plants she has, and plants she would like to purchase. She will be going to a nursery later today, so a page was added for her to use at the nursery. Pt continues to be engaged in therapy and appears excited to use the Lingraphica to help her communicate. She requires mod assist for device navigation, but expect this to improve with repetition.   Thank you,  Genene Churn, [place]  [place], [place] 07/10/2021, 2:19 PM

## 2021-07-16 ENCOUNTER — Ambulatory Visit (HOSPITAL_COMMUNITY): Payer: Medicare Other | Attending: Family Medicine | Admitting: Speech Pathology

## 2021-07-16 ENCOUNTER — Encounter (HOSPITAL_COMMUNITY): Payer: Self-pay | Admitting: Speech Pathology

## 2021-07-16 DIAGNOSIS — I69320 Aphasia following cerebral infarction: Secondary | ICD-10-CM | POA: Diagnosis not present

## 2021-07-16 DIAGNOSIS — R29898 Other symptoms and signs involving the musculoskeletal system: Secondary | ICD-10-CM | POA: Insufficient documentation

## 2021-07-16 DIAGNOSIS — I69890 Apraxia following other cerebrovascular disease: Secondary | ICD-10-CM

## 2021-07-16 DIAGNOSIS — R4701 Aphasia: Secondary | ICD-10-CM

## 2021-07-16 NOTE — Therapy (Signed)
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 She was discharged from [facility] inpatient rehab on 01/15/21 and seen for outpatient SLP therapy from 01/24/21-03/26/21. She was discharged from SLP services at the request from the patient and her family due to Pt feeling frustrated about her aphasia. She made slow, but good progress during outpatient SLP therapy with [place] Aphasia Screening Tool (MAST) scores improving from 2/50 to 15/50 in Expressive portion and 26/50 to 28/50 for the Receptive portion.   END OF SESSION:   End of Session - 07/16/21 1745     Visit Number 6    Number of Visits 8    Date for SLP Re-Evaluation 07/25/21    Authorization Type [employer] Medicare    Progress Note Due on Visit --   10th visit   SLP Start Time 1320    SLP Stop Time  1405    SLP Time Calculation (min) 45 min    Activity Tolerance Patient tolerated treatment well             Past Medical History:  Diagnosis Date   Hypertension    TMJ (dislocation of temporomandibular joint)    Past Surgical History:  Procedure Laterality Date   ABDOMINAL HYSTERECTOMY     CHOLECYSTECTOMY     IR ANGIO INTRA EXTRACRAN SEL INTERNAL CAROTID BILAT MOD SED  09/16/2016   IR ANGIO VERTEBRAL SEL VERTEBRAL BILAT MOD SED  09/16/2016   MASS EXCISION Left 02/10/2020   Procedure: excision of soft tissue mass left upper back;  Surgeon: Jesusita Oka, MD;   Location: [place];  Service: General;  Laterality: Left;   Patient Active Problem List   Diagnosis Date Noted   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[place]) 05/07/2021   Aphasia as late effect of cerebrovascular accident 04/24/2021   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 ([place]) 04/24/2021   Decreased estrogen level 04/24/2021   Diminished vision 04/24/2021   Heart murmur 04/24/2021   Lipoma 04/24/2021   Pure hypercholesterolemia 04/24/2021   Slow transit constipation    Dyslipidemia    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 No    GOALS: Goals reviewed with patient? Yes   SHORT TERM GOALS: Target date: 07/17/2021   Pt will provide a gesture for set list of 20 words with 90% effectiveness and mi/mod prompt via modeling from SLP.    Baseline: 75% max assist  Goal status: Ongoing   2.  Pt will participate in a 2-week trial using alternative communication devices to communicate with her family and therapist due to the severity of her  aphasia. Baseline: Low tech versions only at this time Goal status: Ongoing   3.  Pt will increase naming of common objects/pictures to 80% acc when provided with mi/mod cues.  Baseline: mod/max cues Goal status: Ongoing   4.  Patient will point to objects and object pictures/photos when named in field of 5 with 90% accuracy and cue for error awareness prn.  Baseline: 60% Goal status: Ongoing   5.  Pt will answer personally relevant questions regarding preferences with 80% acc when provided mi/mod assist and total  communication options. Baseline: Max assist Goal status: Ongoing     LONG TERM GOALS: Target date: 08/07/2021   SLP LONG TERM GOAL #1  Pt will communicate basic wants/needs to [facility] via multimodality communication strategies with mi/mod assist from caregivers.  Baseline: Max assist    Goal status: Ongoing   SLP LONG TERM GOAL #2 Pt will increase auditory comprehension for basic level information to [facility] with use of multimodality cues and min assist from caregivers.  Baseline: mi/mod assist    Goal status: Ongoing   ASSESSMENT:   CLINICAL IMPRESSION: Patient is a 71 y.o. female who was seen today for a speech/language evaluation due to residual severe aphasia from her stroke on 12/25/2020. Pt presents with dysnomia, perseverative response interference, impaired initiation, premorbid stutter, impaired phrase repetition, good automatic speech when provided with initial cueing, good understanding of basic yes/no questions and object identification in field of 4, and difficulty following oral/written directions. Pt was previously given a communication template filled out with personally relevant vocabulary, however her family members indicate that they primarily communicate with her by asking her yes/no questions and pointing which is laborious, frustrating, and ineffective to both the patient and family. Pt benefits from multimodality communication strategies to elicit word productions (including melodic intonation, phrase completion, modeling, gestures, breaking perseverative responses, and written cues). Pt and family would like for Pt to participate in SLP therapy again to improve communication skills. They are open to exploring alternative communication options as well.    OBJECTIVE IMPAIRMENTS include executive functioning, expressive language, receptive language, and aphasia. These impairments are limiting patient from managing medications, managing appointments, managing finances, and  effectively communicating at home and in community. Factors affecting potential to achieve goals and functional outcome are ability to learn/carryover information, cooperation/participation level, and severity of impairments. Patient will benefit from skilled SLP services to address above impairments and improve overall function.   REHAB POTENTIAL: Fair Pt with severe expressive aphasia   PLAN: SLP FREQUENCY: 2x/week   SLP DURATION: 6 weeks   PLANNED INTERVENTIONS: Cueing hierachy, Cognitive reorganization, Internal/external aids, Functional tasks, Multimodal communication approach, SLP instruction and feedback, Compensatory strategies, Patient/family education, and trial of alternative communication systems OBJECTIVE:   TODAY'S TREATMENT: Pt accompanied to therapy with her granddaughter, Merleen Nicely.  Pt continues to be engaged in therapy and appears excited to use the Lingraphica to help her communicate. The device was further personalized with assist from Pt and [name]. She was able to select appropriate icons and answer "Wh" questions related to her plants with 95% acc with cues for scanning. She is not yet able to edit and create icons without mod assist, however she has her granddaughter to help her with this. Pt noted to have increased spontaneous vocalization (appropriate) when communicating with SLP with use of assistive device. She requires mod assist for device navigation, but expect this to improve with repetition.   Thank you,  Genene Churn, [place]  [place], [place] 07/16/2021,  5:46 PM

## 2021-07-24 ENCOUNTER — Ambulatory Visit (HOSPITAL_COMMUNITY): Payer: Medicare Other | Attending: Family Medicine | Admitting: Speech Pathology

## 2021-07-24 ENCOUNTER — Encounter (HOSPITAL_COMMUNITY): Payer: Self-pay | Admitting: Speech Pathology

## 2021-07-24 DIAGNOSIS — R4701 Aphasia: Secondary | ICD-10-CM | POA: Diagnosis not present

## 2021-07-24 DIAGNOSIS — I69890 Apraxia following other cerebrovascular disease: Secondary | ICD-10-CM | POA: Insufficient documentation

## 2021-07-24 NOTE — Therapy (Signed)
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 She was discharged from [facility] inpatient rehab on 01/15/21 and seen for outpatient SLP therapy from 01/24/21-03/26/21. She was discharged from SLP services at the request from the patient and her family due to Pt feeling frustrated about her aphasia. She made slow, but good progress during outpatient SLP therapy with [place] Aphasia Screening Tool (MAST) scores improving from 2/50 to 15/50 in Expressive portion and 26/50 to 28/50 for the Receptive portion.   END OF SESSION:   End of Session - 07/24/21 0955     Visit Number 7    Number of Visits 8    Date for SLP Re-Evaluation 07/25/21    Authorization Type [employer] Medicare    Progress Note Due on Visit --   10th visit   SLP Start Time 0910    SLP Stop Time  0955    SLP Time Calculation (min) 45 min    Activity Tolerance Patient tolerated treatment well             Past Medical History:  Diagnosis Date   Hypertension    TMJ (dislocation of temporomandibular joint)    Past Surgical History:  Procedure Laterality Date   ABDOMINAL HYSTERECTOMY     CHOLECYSTECTOMY     IR ANGIO INTRA EXTRACRAN SEL INTERNAL CAROTID BILAT MOD SED  09/16/2016   IR ANGIO VERTEBRAL SEL VERTEBRAL BILAT MOD SED  09/16/2016   MASS EXCISION Left 02/10/2020   Procedure: excision of soft tissue mass left upper back;  Surgeon: Jesusita Oka, MD;   Location: [place];  Service: General;  Laterality: Left;   Patient Active Problem List   Diagnosis Date Noted   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[place]) 05/07/2021   Aphasia as late effect of cerebrovascular accident 04/24/2021   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 ([place]) 04/24/2021   Decreased estrogen level 04/24/2021   Diminished vision 04/24/2021   Heart murmur 04/24/2021   Lipoma 04/24/2021   Pure hypercholesterolemia 04/24/2021   Slow transit constipation    Dyslipidemia    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 No    GOALS: Goals reviewed with patient? Yes   SHORT TERM GOALS: Target date: 07/17/2021   Pt will provide a gesture for set list of 20 words with 90% effectiveness and mi/mod prompt via modeling from SLP.    Baseline: 75% max assist  Goal status: Ongoing   2.  Pt will participate in a 2-week trial using alternative communication devices to communicate with her family and therapist due to the severity of her  aphasia. Baseline: Low tech versions only at this time Goal status: Ongoing   3.  Pt will increase naming of common objects/pictures to 80% acc when provided with mi/mod cues.  Baseline: mod/max cues Goal status: Ongoing   4.  Patient will point to objects and object pictures/photos when named in field of 5 with 90% accuracy and cue for error awareness prn.  Baseline: 60% Goal status: Ongoing   5.  Pt will answer personally relevant questions regarding preferences with 80% acc when provided mi/mod assist and total  communication options. Baseline: Max assist Goal status: Ongoing     LONG TERM GOALS: Target date: 08/07/2021   SLP LONG TERM GOAL #1  Pt will communicate basic wants/needs to [facility] via multimodality communication strategies with mi/mod assist from caregivers.  Baseline: Max assist    Goal status: Ongoing   SLP LONG TERM GOAL #2 Pt will increase auditory comprehension for basic level information to [facility] with use of multimodality cues and min assist from caregivers.  Baseline: mi/mod assist    Goal status: Ongoing   ASSESSMENT:   CLINICAL IMPRESSION: Patient is a 71 y.o. female who was seen today for a speech/language evaluation due to residual severe aphasia from her stroke on 12/25/2020. Pt presents with dysnomia, perseverative response interference, impaired initiation, premorbid stutter, impaired phrase repetition, good automatic speech when provided with initial cueing, good understanding of basic yes/no questions and object identification in field of 4, and difficulty following oral/written directions. Pt was previously given a communication template filled out with personally relevant vocabulary, however her family members indicate that they primarily communicate with her by asking her yes/no questions and pointing which is laborious, frustrating, and ineffective to both the patient and family. Pt benefits from multimodality communication strategies to elicit word productions (including melodic intonation, phrase completion, modeling, gestures, breaking perseverative responses, and written cues). Pt and family would like for Pt to participate in SLP therapy again to improve communication skills. They are open to exploring alternative communication options as well.    OBJECTIVE IMPAIRMENTS include executive functioning, expressive language, receptive language, and aphasia. These impairments are limiting patient from managing medications, managing appointments, managing finances, and  effectively communicating at home and in community. Factors affecting potential to achieve goals and functional outcome are ability to learn/carryover information, cooperation/participation level, and severity of impairments. Patient will benefit from skilled SLP services to address above impairments and improve overall function.   REHAB POTENTIAL: Fair Pt with severe expressive aphasia   PLAN: SLP FREQUENCY: 2x/week   SLP DURATION: 6 weeks   PLANNED INTERVENTIONS: Cueing hierachy, Cognitive reorganization, Internal/external aids, Functional tasks, Multimodal communication approach, SLP instruction and feedback, Compensatory strategies, Patient/family education, and trial of alternative communication systems OBJECTIVE:   TODAY'S TREATMENT: Pt accompanied to therapy with her granddaughter, Merleen Nicely. They added additional icons to the Lingraphica device and Pt was able to show SLP these icons with min cues. She responded to personal questions with verbal responses (50% acc) and use of device (90+%). She was able to select her favorite plants once provided with min cues to navigate to the page. Additional information regarding names and phone numbers for physicians were added and Pt able to select icon upon request. Ongoing Pt/caregiver education completed and plan to continue over the next month for use of the device.  Thank you,  Genene Churn, [place]  [place], [place] 07/24/2021, 10:11 AM

## 2021-07-25 ENCOUNTER — Ambulatory Visit (HOSPITAL_COMMUNITY): Payer: Medicare Other | Admitting: Speech Pathology

## 2021-07-25 ENCOUNTER — Encounter (HOSPITAL_COMMUNITY): Payer: Self-pay | Admitting: Speech Pathology

## 2021-07-25 DIAGNOSIS — R4701 Aphasia: Secondary | ICD-10-CM

## 2021-07-25 DIAGNOSIS — I69890 Apraxia following other cerebrovascular disease: Secondary | ICD-10-CM | POA: Diagnosis not present

## 2021-07-25 NOTE — Therapy (Incomplete)
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 She was discharged from [facility] inpatient rehab on 01/15/21 and seen for outpatient SLP therapy from 01/24/21-03/26/21. She was discharged from SLP services at the request from the patient and her family due to Pt feeling frustrated about her aphasia. She made slow, but good progress during outpatient SLP therapy with [place] Aphasia Screening Tool (MAST) scores improving from 2/50 to 15/50 in Expressive portion and 26/50 to 28/50 for the Receptive portion.   END OF SESSION:   End of Session - 07/25/21 1312     Visit Number 8    Number of Visits 16    Date for SLP Re-Evaluation 08/29/21    Authorization Type [employer] Medicare    Progress Note Due on Visit --   10th visit   SLP Start Time 0950    SLP Stop Time  1040    SLP Time Calculation (min) 50 min    Activity Tolerance Patient tolerated treatment well             Past Medical History:  Diagnosis Date   Hypertension    TMJ (dislocation of temporomandibular joint)    Past Surgical History:  Procedure Laterality Date   ABDOMINAL HYSTERECTOMY     CHOLECYSTECTOMY     IR ANGIO INTRA EXTRACRAN SEL INTERNAL CAROTID BILAT MOD SED  09/16/2016   IR ANGIO VERTEBRAL SEL VERTEBRAL BILAT MOD SED  09/16/2016   MASS EXCISION Left 02/10/2020   Procedure: excision of soft tissue mass left upper back;  Surgeon: Jesusita Oka, MD;   Location: [place];  Service: General;  Laterality: Left;   Patient Active Problem List   Diagnosis Date Noted   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/07/2021   Aphasia as late effect of cerebrovascular accident 04/24/2021   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 ([place]) 04/24/2021   Decreased estrogen level 04/24/2021   Diminished vision 04/24/2021   Heart murmur 04/24/2021   Lipoma 04/24/2021   Pure hypercholesterolemia 04/24/2021   Slow transit constipation    Dyslipidemia    Left middle cerebral artery stroke ([place]) 01/02/2021   Chronic heart failure with preserved ejection fraction (HFpEF) ([place]) 01/02/2021   Acute ischemic stroke ([place]) 12/26/2020   Essential hypertension 12/26/2020   Mixed hyperlipidemia 12/26/2020   Acute ischemic left MCA stroke ([place]) 12/26/2020     Rationale for Evaluation and Treatment Rehabilitation  SUBJECTIVE: "Yes!"  PAIN:  Are you having pain? No    GOALS: Goals reviewed with patient? Yes   SHORT TERM GOALS: Target date: 07/17/2021   Pt will provide a gesture for set list of 20 words with 90% effectiveness and mi/mod prompt via modeling from SLP.    Baseline: 75% max assist  Goal status: Partially Met/discontinue   2.  Pt will participate in a 2-week trial using alternative communication devices to communicate with her family and therapist due to the severity of her  aphasia. Baseline: Low tech versions only at this time Goal status: Met   3.  Pt will increase naming of common objects/pictures to 80% acc when provided with mi/mod cues.  Baseline: mod/max cues Goal status: Partially Met   4.  Patient will point to objects and object pictures/photos when named in field of 5 with 90% accuracy and cue for error awareness prn.  Baseline: 60% Goal status: Met   5.  Pt will answer personally relevant questions regarding preferences with 80% acc when provided mi/mod assist and total  communication options. Baseline: Max assist Goal status: Ongoing  6. Pt will initiate communication about a shared interest through use of alternative communication systems with indirect prompt over 3 consecutive sessions.   Baseline: moderate prompts  Goal status: Initial  7. Pt will respond to personal preference questions by answering with alternative communication device with 95% acc and min assist.   Baseline: moderate prompts  Goal status: Initial  8. Client will use device to cue themselves to speak (reading or imitating words on device) during 1:1 conversations with SLP with indirect cues over 3 consecutive sessions.    Baseline: moderate prompts  Goal status: Initial   LONG TERM GOALS: Target date: 08/07/2021   SLP LONG TERM GOAL #1  Pt will communicate basic wants/needs to [facility] via multimodality communication strategies with mi/mod assist from caregivers.  Baseline: Max assist    Goal status: Ongoing   SLP LONG TERM GOAL #2 Pt will increase auditory comprehension for basic level information to [facility] with use of multimodality cues and min assist from caregivers.  Baseline: mi/mod assist    Goal status: Ongoing   ASSESSMENT:   CLINICAL IMPRESSION: Patient is a 71 y.o. female who was seen today for a speech/language evaluation due to residual severe aphasia from her stroke on 12/25/2020. Pt presents with dysnomia, perseverative response interference, impaired initiation, premorbid stutter, impaired phrase repetition, good automatic speech when provided with initial cueing, good understanding of basic yes/no questions and object identification in field of 4, and difficulty following oral/written directions. Pt was previously given a communication template filled out with personally relevant vocabulary, however her family members indicate that they primarily communicate with her by asking her yes/no questions and pointing which is laborious, frustrating, and ineffective to both the  patient and family. Pt benefits from multimodality communication strategies to elicit word productions (including melodic intonation, phrase completion, modeling, gestures, breaking perseverative responses, and written cues). Pt and family would like for Pt to participate in SLP therapy again to improve communication skills. They are open to exploring alternative communication options as well.    OBJECTIVE IMPAIRMENTS include executive functioning, expressive language, receptive language, and aphasia. These impairments are limiting patient from managing medications, managing appointments, managing finances, and effectively communicating at home and in community. Factors affecting potential to achieve goals and functional outcome are ability to learn/carryover information, cooperation/participation level, and severity of impairments. Patient will benefit from skilled SLP services to address above impairments and improve overall function.   REHAB POTENTIAL: Fair Pt with severe expressive aphasia   PLAN: SLP FREQUENCY: 2x/week   SLP DURATION: 6 weeks   PLANNED INTERVENTIONS: Cueing hierachy, Cognitive reorganization, Internal/external aids, Functional tasks, Multimodal communication approach, SLP instruction and feedback, Compensatory strategies, Patient/family education, and trial of alternative communication systems  OBJECTIVE:   TODAY'S TREATMENT: Pt accompanied to therapy with her granddaughter, Merleen Nicely. They added additional icons to the [employer] device and Pt was able to show SLP these icons with  min cues. She responded to personal questions with verbal responses (50% acc) and use of device (90+%). She was able to select her favorite plants once provided with min cues to navigate to the page. Additional information regarding names and phone numbers for physicians were added and Pt able to select icon upon request. Ongoing Pt/caregiver education completed and plan to continue over the next month  for use of the device.  Thank you,  Genene Churn, [place]  [place], [place] 07/25/2021, 1:20 PM

## 2021-07-29 ENCOUNTER — Ambulatory Visit (HOSPITAL_COMMUNITY): Payer: Medicare Other | Admitting: Speech Pathology

## 2021-07-29 ENCOUNTER — Encounter (HOSPITAL_COMMUNITY): Payer: Self-pay | Admitting: Speech Pathology

## 2021-07-29 DIAGNOSIS — I69890 Apraxia following other cerebrovascular disease: Secondary | ICD-10-CM

## 2021-07-29 DIAGNOSIS — R4701 Aphasia: Secondary | ICD-10-CM

## 2021-07-29 DIAGNOSIS — R41841 Cognitive communication deficit: Secondary | ICD-10-CM

## 2021-07-29 NOTE — Therapy (Signed)
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 She was discharged from [facility] inpatient rehab on 01/15/21 and seen for outpatient SLP therapy from 01/24/21-03/26/21. She was discharged from SLP services at the request from the patient and her family due to Pt feeling frustrated about her aphasia. She made slow, but good progress during outpatient SLP therapy with [place] Aphasia Screening Tool (MAST) scores improving from 2/50 to 15/50 in Expressive portion and 26/50 to 28/50 for the Receptive portion.   END OF SESSION:   End of Session - 07/29/21 1233     Visit Number 9    Number of Visits 16    Date for SLP Re-Evaluation 08/29/21    Authorization Type [employer] Medicare    Progress Note Due on Visit --   10th visit   SLP Start Time 0905    SLP Stop Time  [phone]    SLP Time Calculation (min) 47 min    Activity Tolerance Patient tolerated treatment well             Past Medical History:  Diagnosis Date   Hypertension    TMJ (dislocation of temporomandibular joint)    Past Surgical History:  Procedure Laterality Date   ABDOMINAL HYSTERECTOMY     CHOLECYSTECTOMY     IR ANGIO INTRA EXTRACRAN SEL INTERNAL CAROTID BILAT MOD SED  09/16/2016   IR ANGIO VERTEBRAL SEL VERTEBRAL BILAT MOD SED  09/16/2016   MASS EXCISION Left 02/10/2020   Procedure: excision of soft tissue mass left upper back;  Surgeon: Jesusita Oka, MD;   Location: [place];  Service: General;  Laterality: Left;   Patient Active Problem List   Diagnosis Date Noted   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[place]) 05/07/2021   Aphasia as late effect of cerebrovascular accident 04/24/2021   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 ([place]) 04/24/2021   Decreased estrogen level 04/24/2021   Diminished vision 04/24/2021   Heart murmur 04/24/2021   Lipoma 04/24/2021   Pure hypercholesterolemia 04/24/2021   Slow transit constipation    Dyslipidemia    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 No    GOALS: Goals reviewed with patient? Yes   SHORT TERM GOALS: Target date: 08/24/2021   Pt will provide a gesture for set list of 20 words with 90% effectiveness and mi/mod prompt via modeling from SLP.    Baseline: 75% max assist  Goal status: Partially Met/discontinue   2.  Pt will participate in a 2-week trial using alternative communication devices to communicate with her family and therapist due to the severity of her  aphasia. Baseline: Low tech versions only at this time Goal status: Met   3.  Pt will increase naming of common objects/pictures to 80% acc when provided with mi/mod cues.  Baseline: mod/max cues Goal status: Partially Met   4.  Patient will point to objects and object pictures/photos when named in field of 5 with 90% accuracy and cue for error awareness prn.  Baseline: 60% Goal status: Met   5.  Pt will answer personally relevant questions regarding preferences with 80% acc when provided mi/mod assist and total  communication options. Baseline: Max assist Goal status: Ongoing  6. Pt will initiate communication about a shared interest through use of alternative communication systems with indirect prompt over 3 consecutive sessions.   Baseline: moderate prompts  Goal status: Ongoing  7. Pt will respond to personal preference questions by answering with alternative communication device with 95% acc and min assist.   Baseline: moderate prompts  Goal status: Ongoing  8. Client will use device to cue themselves to speak (reading or imitating words on device) during 1:1 conversations with SLP with indirect cues over 3 consecutive sessions.    Baseline: moderate prompts  Goal status: Ongoing   LONG TERM GOALS: Target date: 08/24/2021   SLP LONG TERM GOAL #1  Pt will communicate basic wants/needs to [facility] via multimodality communication strategies with mi/mod assist from caregivers.  Baseline: Max assist    Goal status: Ongoing   SLP LONG TERM GOAL #2 Pt will increase auditory comprehension for basic level information to [facility] with use of multimodality cues and min assist from caregivers.  Baseline: mi/mod assist    Goal status: Ongoing   ASSESSMENT:   CLINICAL IMPRESSION: Patient is a 71 y.o. female who was seen today for a speech/language evaluation due to residual severe aphasia from her stroke on 12/25/2020. Pt presents with dysnomia, perseverative response interference, impaired initiation, premorbid stutter, impaired phrase repetition, good automatic speech when provided with initial cueing, good understanding of basic yes/no questions and object identification in field of 4, and difficulty following oral/written directions. Pt was previously given a communication template filled out with personally relevant vocabulary, however her family members indicate that they primarily communicate with her by asking her yes/no questions and pointing which is laborious, frustrating, and ineffective to both the  patient and family. Pt benefits from multimodality communication strategies to elicit word productions (including melodic intonation, phrase completion, modeling, gestures, breaking perseverative responses, and written cues). Pt and family would like for Pt to participate in SLP therapy again to improve communication skills. They are open to exploring alternative communication options as well.    OBJECTIVE IMPAIRMENTS include executive functioning, expressive language, receptive language, and aphasia. These impairments are limiting patient from managing medications, managing appointments, managing finances, and effectively communicating at home and in community. Factors affecting potential to achieve goals and functional outcome are ability to learn/carryover information, cooperation/participation level, and severity of impairments. Patient will benefit from skilled SLP services to address above impairments and improve overall function.   REHAB POTENTIAL: Fair Pt with severe expressive aphasia   PLAN: SLP FREQUENCY: 2x/week   SLP DURATION: 6 weeks   PLANNED INTERVENTIONS: Cueing hierachy, Cognitive reorganization, Internal/external aids, Functional tasks, Multimodal communication approach, SLP instruction and feedback, Compensatory strategies, Patient/family education, and trial of alternative communication systems  OBJECTIVE:   TODAY'S TREATMENT: Pt accompanied to therapy with her granddaughter, Merleen Nicely. Session focused on device navigation and total communication strategies. She has an appointment with neurology on Wednesday so  we created icons for her to use to discuss her concerns (right hand swelling, shoulder/arm/hand pain, use of Botox). She was able to select appropriate icons with min assist and repeated verbal response. Next session, will role play a communication interaction for her upcoming MD appointment.Ongoing Pt/caregiver education completed and plan to continue over the next month  for use of the device.  Thank you,  Genene Churn, [place]  [place], [place] 07/29/2021, 12:54 PM

## 2021-07-30 NOTE — Progress Notes (Signed)
[facility] [address] [place]. [place] [phone]       OFFICE FOLLOW UP NOTE  Natasha Vazquez Date of Birth:  29-Feb-1950 Medical Record Number:  [id number]    Primary neurologist: Dr. Leonie Man Reason for Referral:  Stroke   Chief Complaint  Patient presents with   Follow-up    RM 2 with daughter Natasha Vazquez  Pt is well and stable, daughter states things are about the same      HPI:   Update 07/30/2021 JM: Patient returns for follow-up visit after prior initial visit with Dr. Leonie Man 3 months ago.  She is accompanied by her daughter.  Overall stable.  Denies new stroke/TIA symptoms.  Continues to work with SLP for residual aphasia making gradual progress currently using speech device tablet which has been beneficial for her.  Continued right upper extremity weakness, completed OT as recovery plateaued. Reports gradual worsening of right arm pain and decreased movement of shoulder and hand. Denies residual leg weakness.  No gait difficulty.  Compliant on Plavix and Crestor, denies side effects.  Blood pressure today 136/67.  Has not yet completed cardiac monitor which was previously ordered by Dr. Leonie Man (per daughter, they were never called to have this set up).  Routinely follows with PCP Dr. Tamala Julian with f/u visit 7/19 with plans on lab work.   No further concerns at this time     History provided for reference purposes only Consult visit 04/24/2021 Dr. Leonie Man: Natasha Vazquez is a pleasant 71 year old lady seen today for initial office consultation visit for stroke.  She is accompanied by her granddaughter.  History is obtained from them and review of electronic medical records and opossum reviewed pertinent available imaging films in PACS.  She has no significant past medical history except hypertension.  She was brought in for evaluation to [facility] by family for sudden onset of inability to speak and right-sided weakness.  NIH stroke scale was 13 on  admission.  She was last known to be normal at 7 PM on 12/24/2020.  Nobody spoke to her or saw her until next day till 7:30 PM when son went to check on her and found her to be lying on the floor unable to speak and with right-sided weakness.  Code stroke was called but since last known normal was beyond 24 hours she is not a candidate for tPA.  MRI scan was obtained which showed acute patchy left MCA infarcts involving parts of frontal, parietal temporal lobes and basal ganglia.  There is no significant edema or midline shift.  CT angiogram showed no intracranial large vessel occlusion.  Multifocal calcifications were noted in the left MCA with multifocal stenosis involving distal branch vessels and right A1 anterior cerebral artery and bilateral distal MCA branches.  Echocardiogram showed normal ejection fraction of 65 to 70% without definite cardiac source of embolism.  LDL cholesterol was 77 mg percent and hemoglobin A1c was 5.6.  Patient was seen by therapist and transferred to inpatient rehab where she spent several weeks and is currently discharged home.  She was ordered to have a outpatient 30-day heart monitor but for unclear reason that has not yet been done.  Patient is presently living at home with her granddaughter.  Her aphasia persist and speech is not significantly improved.  Right upper extremity also remains significantly weak though her leg strength is improved.  She is able to walk independently.  Family has now arranged to provide 24-hour care for her  at home.  She is currently doing outpatient occupational and speech therapy and has been discharged from physical therapy.  She is still on aspirin and Plavix and tolerating well without bruising or bleeding.  There is no prior history of atrial fibrillation, palpitations, syncope or significant cardiac problems.  She has had no recurrent stroke or TIA symptoms.  ROS:   14 system review of systems is positive for those listed in HPI and all  other systems negative  PMH:  Past Medical History:  Diagnosis Date   Hypertension    TMJ (dislocation of temporomandibular joint)     Social History:  Social History   Socioeconomic History   Marital status: Legally Separated    Spouse name: Not on file   Number of children: Not on file   Years of education: Not on file   Highest education level: Not on file  Occupational History   Not on file  Tobacco Use   Smoking status: Never   Smokeless tobacco: Never  Vaping Use   Vaping Use: Never used  Substance and Sexual Activity   Alcohol use: No   Drug use: No   Sexual activity: Not on file  Other Topics Concern   Not on file  Social History Narrative   Not on file   Social Determinants of Health   Financial Resource Strain: Not on file  Food Insecurity: Not on file  Transportation Needs: Not on file  Physical Activity: Not on file  Stress: Not on file  Social Connections: Not on file  Intimate Partner Violence: Not on file    Medications:   Current Outpatient Medications on File Prior to Visit  Medication Sig Dispense Refill   amLODipine (NORVASC) 10 MG tablet Take 10 mg by mouth daily.     carboxymethylcellulose (REFRESH PLUS) 0.5 % SOLN Place 1 drop into both eyes in the morning and at bedtime.     clopidogrel (PLAVIX) 75 MG tablet Take 1 tablet (75 mg total) by mouth daily. 71 tablet 0   cycloSPORINE (RESTASIS) 0.05 % ophthalmic emulsion Place 1 drop into both eyes 2 (two) times daily.     Multiple Vitamins-Minerals (PRESERVISION AREDS 2 PO) Take 1 tablet by mouth in the morning and at bedtime.     Omega-3 Fatty Acids (FISH OIL) 1000 MG CAPS Take 1 capsule (1,000 mg total) by mouth daily. 30 capsule 0   rosuvastatin (CRESTOR) 20 MG tablet Take 1 tablet (20 mg total) by mouth daily. 30 tablet 0   acetaminophen (TYLENOL) 325 MG tablet Take 2 tablets (650 mg total) by mouth every 4 (four) hours as needed for mild pain (or temp > 37.5 C (99.5 F)). (Patient not  taking: Reported on 07/31/2021)     amLODipine (NORVASC) 2.5 MG tablet Take 1 tablet (2.5 mg total) by mouth daily. (Patient not taking: Reported on 07/31/2021) 30 tablet 0   No current facility-administered medications on file prior to visit.    Allergies:   Allergies  Allergen Reactions   Lisinopril     Other reaction(s): cough   Penicillin V Potassium     Other reaction(s): hives   Penicillins Rash and Other (See Comments)    Has patient had a PCN reaction causing immediate rash, facial/tongue/throat swelling, SOB or lightheadedness with hypotension: Yes Has patient had a PCN reaction causing severe rash involving mucus membranes or skin necrosis: No Has patient had a PCN reaction that required hospitalization: Unknown Has patient had a PCN reaction occurring within the  last 10 years: No If all of the above answers are "NO", then may proceed with Cephalosporin use.     Physical Exam Today's Vitals   07/31/21 1336  BP: 136/67  Pulse: 77  Weight: 141 lb (64 kg)  Height: '5\' 7"'$  (1.702 m)   Body mass index is 22.08 kg/m.  General: well developed, well nourished, very pleasant elderly African-American female, seated, in no evident distress Head: head normocephalic and atraumatic.   Neck: supple with no carotid or supraclavicular bruits Cardiovascular: regular rate and rhythm, no murmurs Musculoskeletal: no deformity Skin:  no rash/petichiae Vascular:  Normal pulses all extremities  Neurologic Exam Mental Status: Awake and fully alert.  Severe expressive aphasia.  Answers yes/no appropriately.  Difficulty naming but able to repeat.  Able to say simple words.  Able to follow simple step commands.  Mood and affect appropriate. Cranial Nerves: Pupils equal, briskly reactive to light. Extraocular movements full without nystagmus. Visual fields full to confrontation. Hearing intact. Facial sensation intact.  Mild right lower facial weakness, tongue, palate moves normally and  symmetrically.  Motor: Full strength in left upper and lower extremity.   RUE: 4/5 proximal and 3/5 grip strength with increased tone and limited ROM shoulder and hand  RLE: 5/5 throughout  Sensory.: intact to touch , pinprick , position and vibratory sensation.  Coordination: Rapid alternating movements normal in all extremities except right hand. Finger-to-nose performed accurately LUE and heel-to-shin performed accurately bilaterally. Gait and Station: Arises from chair without difficulty. Stance is normal. Gait demonstrates normal stride length and balance . Able to heel, toe and tandem walk without difficulty.  Reflexes: 1+ and symmetric. Toes downgoing.      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  Vascular risk factors of hypertension, hyperlipidemia, only.    -referral placed to Dr. Krista Blue to evaluate for potential benefit of Botox injections for painful spasticity -Continue working with SLP for residual aphasia -Continue clopidogrel 75 mg daily and Crestor 20 mg daily for secondary stroke prevention  -Ensure continued close PCP follow-up for aggressive stroke risk factor management -maintain strict control of hypertension with blood pressure goal below 130/90 and lipids with LDL cholesterol goal below 70 mg/dL. -Still needs to complete 30-day cardiac which was previously ordered by Dr. Leonie Man - advised to contact [facility] cardiology to have this scheduled     Follow up as needed after evaluation with Dr. Krista Blue    CC:  Carol Ada, MD   I spent  31 minutes of face-to-face and non-face-to-face time with patient and daughter.  This included previsit chart review, lab review, study review, order entry, electronic health record documentation, patient and daughter education and discussion regarding history of prior stroke with  residual deficits, secondary stroke prevention measures and aggressive stroke risk factor management, and answered all other questions to patient and daughter satisfaction  Frann Rider, AGNP-BC  [facility] [address] [place], [place] [address]  Phone [phone] Fax [phone] Note: This document was prepared with digital dictation and possible smart phrase technology. Any transcriptional errors that result from this process are unintentional.

## 2021-07-31 ENCOUNTER — Encounter (HOSPITAL_COMMUNITY): Payer: Self-pay | Admitting: Speech Pathology

## 2021-07-31 ENCOUNTER — Encounter: Payer: Self-pay | Admitting: Adult Health

## 2021-07-31 ENCOUNTER — Ambulatory Visit (HOSPITAL_COMMUNITY): Payer: Medicare Other | Admitting: Speech Pathology

## 2021-07-31 ENCOUNTER — Ambulatory Visit (INDEPENDENT_AMBULATORY_CARE_PROVIDER_SITE_OTHER): Payer: Medicare Other | Admitting: Adult Health

## 2021-07-31 VITALS — BP 136/67 | HR 77 | Ht 67.0 in | Wt 141.0 lb

## 2021-07-31 DIAGNOSIS — I63412 Cerebral infarction due to embolism of left middle cerebral artery: Secondary | ICD-10-CM | POA: Diagnosis not present

## 2021-07-31 DIAGNOSIS — G8111 Spastic hemiplegia affecting right dominant side: Secondary | ICD-10-CM

## 2021-07-31 DIAGNOSIS — R4701 Aphasia: Secondary | ICD-10-CM | POA: Diagnosis not present

## 2021-07-31 DIAGNOSIS — I69890 Apraxia following other cerebrovascular disease: Secondary | ICD-10-CM

## 2021-07-31 DIAGNOSIS — R41841 Cognitive communication deficit: Secondary | ICD-10-CM

## 2021-07-31 NOTE — Patient Instructions (Addendum)
Continue working with speech therapy for hopeful ongoing recovery   Will place referral to Dr. Krista Blue regarding possible benefit of botox for right arm pain and spasticity   Continue Plavix and Crestor for secondary stroke prevention  Complete cardiac monitor - contact [facility]ology to get this set up [phone]  Continue to follow up with PCP regarding cholesterol and blood pressure management  Maintain strict control of hypertension with blood pressure goal below 130/90 and cholesterol with LDL cholesterol (bad cholesterol) goal below 70 mg/dL.   Signs of a Stroke? Follow the BEFAST method:  Balance Watch for a sudden loss of balance, trouble with coordination or vertigo Eyes Is there a sudden loss of vision in one or both eyes? Or double vision?  Face: Ask the person to smile. Does one side of the face droop or is it numb?  Arms: Ask the person to raise both arms. Does one arm drift downward? Is there weakness or numbness of a leg? Speech: Ask the person to repeat a simple phrase. Does the speech sound slurred/strange? Is the person confused ? Time: If you observe any of these signs, call 911.      Thank you for coming to see [place] at [facility]. I hope we have been able to provide you high quality care today.  You may receive a patient satisfaction survey over the next few weeks. We would appreciate your feedback and comments so that we may continue to improve ourselves and the health of our patients.

## 2021-07-31 NOTE — Therapy (Signed)
OUTPATIENT SPEECH LANGUAGE PATHOLOGY TREATMENT NOTE   Patient Name: Natasha Vazquez MRN: [id number] DOB:1950-06-10, 71 y.o., female Today's Date: 07/31/2021  PCP: Carol Ada, MD REFERRING PROVIDER: Carol Ada, MD  ONSET DATE: 12/25/2020    REFERRING DIAG: Aphasia as late effect from CVA   THERAPY DIAG:  Aphasia   Apraxia following other cerebrovascular disease  PERTINENT HISTORY: Natasha Vazquez is a 71 yo female who was referred for speech/language evaluation and treatment by her PCP, Carol Ada, MD. 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 She was discharged from [facility] inpatient rehab on 01/15/21 and seen for outpatient SLP therapy from 01/24/21-03/26/21. She was discharged from SLP services at the request from the patient and her family due to Pt feeling frustrated about her aphasia. She made slow, but good progress during outpatient SLP therapy with [place] Aphasia Screening Tool (MAST) scores improving from 2/50 to 15/50 in Expressive portion and 26/50 to 28/50 for the Receptive portion.   END OF SESSION:   End of Session - 07/31/21 1149     Visit Number 10    Number of Visits 16    Date for SLP Re-Evaluation 08/29/21    Authorization Type [employer] Medicare    Progress Note Due on Visit --   10th visit   SLP Start Time 0905    SLP Stop Time  0950    SLP Time Calculation (min) 45 min    Activity Tolerance Patient tolerated treatment well             Past Medical History:  Diagnosis Date   Hypertension    TMJ (dislocation of temporomandibular joint)    Past Surgical History:  Procedure Laterality Date   ABDOMINAL HYSTERECTOMY     CHOLECYSTECTOMY     IR ANGIO INTRA EXTRACRAN SEL INTERNAL CAROTID BILAT MOD SED  09/16/2016   IR ANGIO VERTEBRAL SEL VERTEBRAL BILAT MOD SED  09/16/2016   MASS EXCISION Left 02/10/2020   Procedure: excision of soft tissue mass left upper back;  Surgeon: Jesusita Oka, MD;   Location: [facility];  Service: General;  Laterality: Left;   Patient Active Problem List   Diagnosis Date Noted   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   Aphasia as late effect of cerebrovascular accident 04/24/2021   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 ([place]) 04/24/2021   Decreased estrogen level 04/24/2021   Diminished vision 04/24/2021   Heart murmur 04/24/2021   Lipoma 04/24/2021   Pure hypercholesterolemia 04/24/2021   Slow transit constipation    Dyslipidemia    Left middle cerebral artery stroke ([place]) 01/02/2021   Chronic heart failure with preserved ejection fraction (HFpEF) ([place]) 01/02/2021   Acute ischemic stroke ([place]) 12/26/2020   Essential hypertension 12/26/2020   Mixed hyperlipidemia 12/26/2020   Acute ischemic left MCA stroke ([place]) 12/26/2020     Rationale for Evaluation and Treatment Rehabilitation  SUBJECTIVE: "Yes!"  PAIN:  Are you having pain? No    GOALS: Goals reviewed with patient? Yes   SHORT TERM GOALS: Target date: 08/24/2021   Pt will provide a gesture for set list of 20 words with 90% effectiveness and mi/mod prompt via modeling from SLP.    Baseline: 75% max assist  Goal status: Partially Met/discontinue   2.  Pt will participate in a 2-week trial using alternative communication devices to communicate with her family and therapist due to the severity of her  aphasia. Baseline: Low tech versions only at this time Goal status: Met   3.  Pt will increase naming of common objects/pictures to 80% acc when provided with mi/mod cues.  Baseline: mod/max cues Goal status: Partially Met   4.  Patient will point to objects and object pictures/photos when named in field of 5 with 90% accuracy and cue for error awareness prn.  Baseline: 60% Goal status: Met   5.  Pt will answer personally relevant questions regarding preferences with 80% acc when provided mi/mod assist and  total communication options. Baseline: Max assist Goal status: Ongoing  6. Pt will initiate communication about a shared interest through use of alternative communication systems with indirect prompt over 3 consecutive sessions.   Baseline: moderate prompts  Goal status: Ongoing  7. Pt will respond to personal preference questions by answering with alternative communication device with 95% acc and min assist.   Baseline: moderate prompts  Goal status: Ongoing  8. Client will use device to cue themselves to speak (reading or imitating words on device) during 1:1 conversations with SLP with indirect cues over 3 consecutive sessions.    Baseline: moderate prompts  Goal status: Ongoing   LONG TERM GOALS: Target date: 08/24/2021   SLP LONG TERM GOAL #1  Pt will communicate basic wants/needs to [facility] via multimodality communication strategies with mi/mod assist from caregivers.  Baseline: Max assist    Goal status: Ongoing   SLP LONG TERM GOAL #2 Pt will increase auditory comprehension for basic level information to [facility] with use of multimodality cues and min assist from caregivers.  Baseline: mi/mod assist    Goal status: Ongoing   ASSESSMENT:   CLINICAL IMPRESSION: Patient is a 71 y.o. female who was seen today for a speech/language evaluation due to residual severe aphasia from her stroke on 12/25/2020. Pt presents with dysnomia, perseverative response interference, impaired initiation, premorbid stutter, impaired phrase repetition, good automatic speech when provided with initial cueing, good understanding of basic yes/no questions and object identification in field of 4, and difficulty following oral/written directions. Pt was previously given a communication template filled out with personally relevant vocabulary, however her family members indicate that they primarily communicate with her by asking her yes/no questions and pointing which is laborious, frustrating, and ineffective to  both the patient and family. Pt benefits from multimodality communication strategies to elicit word productions (including melodic intonation, phrase completion, modeling, gestures, breaking perseverative responses, and written cues). Pt and family would like for Pt to participate in SLP therapy again to improve communication skills. They are open to exploring alternative communication options as well.    OBJECTIVE IMPAIRMENTS include executive functioning, expressive language, receptive language, and aphasia. These impairments are limiting patient from managing medications, managing appointments, managing finances, and effectively communicating at home and in community. Factors affecting potential to achieve goals and functional outcome are ability to learn/carryover information, cooperation/participation level, and severity of impairments. Patient will benefit from skilled SLP services to address above impairments and improve overall function.   REHAB POTENTIAL: Fair Pt with severe expressive aphasia   PLAN: SLP FREQUENCY: 2x/week   SLP DURATION: 6 weeks   PLANNED INTERVENTIONS: Cueing hierachy, Cognitive reorganization, Internal/external aids, Functional tasks, Multimodal communication approach, SLP instruction and feedback, Compensatory strategies, Patient/family education, and trial of alternative communication systems  OBJECTIVE:   TODAY'S TREATMENT: Pt accompanied to therapy with her granddaughter, Merleen Nicely. She has her neurology appointment later this afternoon, so the session was spent on role playing conversations with  neurology. New icons were added for her to discuss with her provider (pain is worse in the morning, use of heat, sling, epsom salt, use of pain medication). Pt expressed that she does not like to use medications, but is open to discussing with her provider. Jaycee required cues to slow down when navigating the device in the role playing scenario. Her granddaughter will be at  the appointment with her and will facilitate navigation as needed. Overall, Pt appears to be more interactive, relaxed, and smiles frequently in therapy sessions. Will focus on developing additional scripts for her to use (phone calls/appointments) over the next few sessions in addition to ongoing device personalization.Ongoing Pt/caregiver education completed and plan to continue over the next month for use of the device.    Speech Therapy Progress Note  Dates of Reporting Period: 06/05/2021 to 07/31/21  Objective Reports of Subjective Statement: See above  Goal Update: Goals updated above  Plan: 4 more weeks  Reason Skilled Services are Required: Pt continues to make excellent gains in use of total communication strategies and plan is for her to have a personalized alternative communication device. SLP will facilitate use of total communication strategies, device navigation, pt/caregiver education, and reassessment of needs as indicated.  Thank you,  Genene Churn, [place]  [place], [place] 07/31/2021, 12:06 PM

## 2021-08-05 ENCOUNTER — Ambulatory Visit (HOSPITAL_COMMUNITY): Payer: Medicare Other | Attending: Physician Assistant | Admitting: Speech Pathology

## 2021-08-05 ENCOUNTER — Encounter (HOSPITAL_COMMUNITY): Payer: Self-pay | Admitting: Speech Pathology

## 2021-08-05 DIAGNOSIS — I69890 Apraxia following other cerebrovascular disease: Secondary | ICD-10-CM | POA: Insufficient documentation

## 2021-08-05 DIAGNOSIS — R41841 Cognitive communication deficit: Secondary | ICD-10-CM | POA: Insufficient documentation

## 2021-08-05 DIAGNOSIS — R4701 Aphasia: Secondary | ICD-10-CM | POA: Diagnosis not present

## 2021-08-05 NOTE — Therapy (Signed)
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 She was discharged from [facility] inpatient rehab on 01/15/21 and seen for outpatient SLP therapy from 01/24/21-03/26/21. She was discharged from SLP services at the request from the patient and her family due to Pt feeling frustrated about her aphasia. She made slow, but good progress during outpatient SLP therapy with [place] Aphasia Screening Tool (MAST) scores improving from 2/50 to 15/50 in Expressive portion and 26/50 to 28/50 for the Receptive portion.   END OF SESSION:   End of Session - 08/05/21 0956     Visit Number 11    Number of Visits 16    Date for SLP Re-Evaluation 08/29/21    Authorization Type [employer] Medicare    Progress Note Due on Visit --   10th visit   SLP Start Time [phone]    SLP Stop Time  1033    SLP Time Calculation (min) 45 min    Activity Tolerance Patient tolerated treatment well             Past Medical History:  Diagnosis Date   Hypertension    TMJ (dislocation of temporomandibular joint)    Past Surgical History:  Procedure Laterality Date   ABDOMINAL HYSTERECTOMY     CHOLECYSTECTOMY     IR ANGIO INTRA EXTRACRAN SEL INTERNAL CAROTID BILAT MOD SED  09/16/2016   IR ANGIO VERTEBRAL SEL VERTEBRAL BILAT MOD SED  09/16/2016   MASS EXCISION Left 02/10/2020   Procedure: excision of soft tissue mass left upper back;  Surgeon: Jesusita Oka, MD;   Location: [place];  Service: General;  Laterality: Left;   Patient Active Problem List   Diagnosis Date Noted   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/2021   Aphasia as late effect of cerebrovascular accident 04/24/2021   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 ([place]) 04/24/2021   Decreased estrogen level 04/24/2021   Diminished vision 04/24/2021   Heart murmur 04/24/2021   Lipoma 04/24/2021   Pure hypercholesterolemia 04/24/2021   Slow transit constipation    Dyslipidemia    Left middle cerebral artery stroke ([place]) 01/02/2021   Chronic heart failure with preserved ejection fraction (HFpEF) ([place]) 01/02/2021   Acute ischemic stroke ([place]) 12/26/2020   Essential hypertension 12/26/2020   Mixed hyperlipidemia 12/26/2020   Acute ischemic left MCA stroke ([place]) 12/26/2020     Rationale for Evaluation and Treatment Rehabilitation  SUBJECTIVE: "What's that mean?"  PAIN:  Are you having pain? No    GOALS: Goals reviewed with patient? Yes   SHORT TERM GOALS: Target date: 08/24/2021   Pt will provide a gesture for set list of 20 words with 90% effectiveness and mi/mod prompt via modeling from SLP.    Baseline: 75% max assist  Goal status: Partially Met/discontinue   2.  Pt will participate in a 2-week trial using alternative communication devices to communicate with her family and therapist due to the severity  of her aphasia. Baseline: Low tech versions only at this time Goal status: Met   3.  Pt will increase naming of common objects/pictures to 80% acc when provided with mi/mod cues.  Baseline: mod/max cues Goal status: Partially Met   4.  Patient will point to objects and object pictures/photos when named in field of 5 with 90% accuracy and cue for error awareness prn.  Baseline: 60% Goal status: Met   5.  Pt will answer personally relevant questions regarding preferences with 80% acc when provided mi/mod  assist and total communication options. Baseline: Max assist Goal status: Ongoing  6. Pt will initiate communication about a shared interest through use of alternative communication systems with indirect prompt over 3 consecutive sessions.   Baseline: moderate prompts  Goal status: Ongoing  7. Pt will respond to personal preference questions by answering with alternative communication device with 95% acc and min assist.   Baseline: moderate prompts  Goal status: Ongoing  8. Client will use device to cue themselves to speak (reading or imitating words on device) during 1:1 conversations with SLP with indirect cues over 3 consecutive sessions.    Baseline: moderate prompts  Goal status: Ongoing   LONG TERM GOALS: Target date: 08/24/2021   SLP LONG TERM GOAL #1  Pt will communicate basic wants/needs to [facility] via multimodality communication strategies with mi/mod assist from caregivers.  Baseline: Max assist    Goal status: Ongoing   SLP LONG TERM GOAL #2 Pt will increase auditory comprehension for basic level information to [facility] with use of multimodality cues and min assist from caregivers.  Baseline: mi/mod assist    Goal status: Ongoing   ASSESSMENT:   CLINICAL IMPRESSION: Patient is a 71 y.o. female who was seen today for a speech/language evaluation due to residual severe aphasia from her stroke on 12/25/2020. Pt presents with dysnomia, perseverative response interference, impaired initiation, premorbid stutter, impaired phrase repetition, good automatic speech when provided with initial cueing, good understanding of basic yes/no questions and object identification in field of 4, and difficulty following oral/written directions. Pt was previously given a communication template filled out with personally relevant vocabulary, however her family members indicate that they primarily communicate with her by asking her yes/no questions and pointing which is laborious, frustrating, and  ineffective to both the patient and family. Pt benefits from multimodality communication strategies to elicit word productions (including melodic intonation, phrase completion, modeling, gestures, breaking perseverative responses, and written cues). Pt and family would like for Pt to participate in SLP therapy again to improve communication skills. They are open to exploring alternative communication options as well.    OBJECTIVE IMPAIRMENTS include executive functioning, expressive language, receptive language, and aphasia. These impairments are limiting patient from managing medications, managing appointments, managing finances, and effectively communicating at home and in community. Factors affecting potential to achieve goals and functional outcome are ability to learn/carryover information, cooperation/participation level, and severity of impairments. Patient will benefit from skilled SLP services to address above impairments and improve overall function.   REHAB POTENTIAL: Fair Pt with severe expressive aphasia   PLAN: SLP FREQUENCY: 2x/week   SLP DURATION: 6 weeks   PLANNED INTERVENTIONS: Cueing hierachy, Cognitive reorganization, Internal/external aids, Functional tasks, Multimodal communication approach, SLP instruction and feedback, Compensatory strategies, Patient/family education, and trial of alternative communication systems  OBJECTIVE:   TODAY'S TREATMENT: Pt accompanied to therapy with her granddaughter, Merleen Nicely. Unfortunately, the did not bring her communication device to her neurology appointment last week because of  a transportation change. This session, SLP provided less visual cues to have Pt navigate the device more independently to answer open ended questions. She was able to navigate to her physicians page and an additional icon was created for a new neurologist she will see regarding possible botox in her arm. Pt selected family members she will see on July 4th, 10+ food  items she will consume, favorite desserts, and items she will purchase from the grocery store later today. SLP cued Pt to repeat a verbal response after selecting icons and she was able to do so with 85% acc with min cues. She needs prompts to find specific icons that are not featured on her personalized pages (ie. Holidays> icon search), but Pt able to then participate in a conversation when SLP assists with novel navigation. Ongoing Pt/caregiver education completed and plan to continue over the next month for use of the device.     Thank you,  Genene Churn, [place]  [place], [place] 08/05/2021, 9:57 AM

## 2021-08-07 ENCOUNTER — Encounter (HOSPITAL_COMMUNITY): Payer: Self-pay | Admitting: Speech Pathology

## 2021-08-07 ENCOUNTER — Ambulatory Visit (HOSPITAL_COMMUNITY): Payer: Medicare Other | Admitting: Speech Pathology

## 2021-08-07 DIAGNOSIS — R4701 Aphasia: Secondary | ICD-10-CM | POA: Diagnosis not present

## 2021-08-07 DIAGNOSIS — I69890 Apraxia following other cerebrovascular disease: Secondary | ICD-10-CM | POA: Diagnosis not present

## 2021-08-07 NOTE — Therapy (Addendum)
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 She was discharged from [facility] inpatient rehab on 01/15/21 and seen for outpatient SLP therapy from 01/24/21-03/26/21. She was discharged from SLP services at the request from the patient and her family due to Pt feeling frustrated about her aphasia. She made slow, but good progress during outpatient SLP therapy with [place] Aphasia Screening Tool (MAST) scores improving from 2/50 to 15/50 in Expressive portion and 26/50 to 28/50 for the Receptive portion.   END OF SESSION:   End of Session - 08/07/21 1314     Visit Number 12   Number of Visits 17    Date for SLP Re-Evaluation 08/29/21    Authorization Type [employer] Medicare    Progress Note Due on Visit --   10th visit   SLP Start Time [phone]    SLP Stop Time  1034    SLP Time Calculation (min) 45 min    Activity Tolerance Patient tolerated treatment well             Past Medical History:  Diagnosis Date   Hypertension    TMJ (dislocation of temporomandibular joint)    Past Surgical History:  Procedure Laterality Date   ABDOMINAL HYSTERECTOMY     CHOLECYSTECTOMY     IR ANGIO INTRA EXTRACRAN SEL INTERNAL CAROTID BILAT MOD SED  09/16/2016   IR ANGIO VERTEBRAL SEL VERTEBRAL BILAT MOD SED  09/16/2016   MASS EXCISION Left 02/10/2020   Procedure: excision of soft tissue mass left upper back;  Surgeon: Jesusita Oka, MD;   Location: [place];  Service: General;  Laterality: Left;   Patient Active Problem List   Diagnosis Date Noted   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) 01/02/2021   Acute ischemic stroke ([place]) 12/26/2020   Essential hypertension 12/26/2020   Mixed hyperlipidemia 12/26/2020   Acute ischemic left MCA stroke ([place]) 12/26/2020     Rationale for Evaluation and Treatment Rehabilitation  SUBJECTIVE: "Tired"  PAIN:  Are you having pain? No    GOALS: Goals reviewed with patient? Yes   SHORT TERM GOALS: Target date: 08/24/2021   Pt will provide a gesture for set list of 20 words with 90% effectiveness and mi/mod prompt via modeling from SLP.    Baseline: 75% max assist  Goal status: Partially Met/discontinue   2.  Pt will participate in a 2-week trial using alternative communication devices to communicate with her family and therapist due to the severity of her aphasia.  Baseline: Low tech versions only at this time Goal status: Met   3.  Pt will increase naming of common objects/pictures to 80% acc when provided with mi/mod cues.  Baseline: mod/max cues Goal status: Partially Met   4.  Patient will point to objects and object pictures/photos when named in field of 5 with 90% accuracy and cue for error awareness prn.  Baseline: 60% Goal status: Met   5.  Pt will answer personally relevant questions regarding preferences with 80% acc when provided mi/mod assist and  total communication options. Baseline: Max assist Goal status: Ongoing  6. Pt will initiate communication about a shared interest through use of alternative communication systems with indirect prompt over 3 consecutive sessions.   Baseline: moderate prompts  Goal status: Ongoing  7. Pt will respond to personal preference questions by answering with alternative communication device with 95% acc and min assist.   Baseline: moderate prompts  Goal status: Ongoing  8. Client will use device to cue themselves to speak (reading or imitating words on device) during 1:1 conversations with SLP with indirect cues over 3 consecutive sessions.    Baseline: moderate prompts  Goal status: Ongoing   LONG TERM GOALS: Target date: 08/24/2021   SLP LONG TERM GOAL #1  Pt will communicate basic wants/needs to [facility] via multimodality communication strategies with mi/mod assist from caregivers.  Baseline: Max assist    Goal status: Ongoing   SLP LONG TERM GOAL #2 Pt will increase auditory comprehension for basic level information to [facility] with use of multimodality cues and min assist from caregivers.  Baseline: mi/mod assist    Goal status: Ongoing   ASSESSMENT:   CLINICAL IMPRESSION: Patient is a 71 y.o. female who was seen today for a speech/language evaluation due to residual severe aphasia from her stroke on 12/25/2020. Pt presents with dysnomia, perseverative response interference, impaired initiation, premorbid stutter, impaired phrase repetition, good automatic speech when provided with initial cueing, good understanding of basic yes/no questions and object identification in field of 4, and difficulty following oral/written directions. Pt was previously given a communication template filled out with personally relevant vocabulary, however her family members indicate that they primarily communicate with her by asking her yes/no questions and pointing which is laborious, frustrating, and ineffective to  both the patient and family. Pt benefits from multimodality communication strategies to elicit word productions (including melodic intonation, phrase completion, modeling, gestures, breaking perseverative responses, and written cues). Pt and family would like for Pt to participate in SLP therapy again to improve communication skills. They are open to exploring alternative communication options as well.    OBJECTIVE IMPAIRMENTS include executive functioning, expressive language, receptive language, and aphasia. These impairments are limiting patient from managing medications, managing appointments, managing finances, and effectively communicating at home and in community. Factors affecting potential to achieve goals and functional outcome are ability to learn/carryover information, cooperation/participation level, and severity of impairments. Patient will benefit from skilled SLP services to address above impairments and improve overall function.   REHAB POTENTIAL: Fair Pt with severe expressive aphasia   PLAN: SLP FREQUENCY: 2x/week   SLP DURATION: 6 weeks   PLANNED INTERVENTIONS: Cueing hierachy, Cognitive reorganization, Internal/external aids, Functional tasks, Multimodal communication approach, SLP instruction and feedback, Compensatory strategies, Patient/family education, and trial of alternative communication systems  OBJECTIVE:   TODAY'S TREATMENT: Pt accompanied to therapy with her granddaughter, Merleen Nicely. Pt communicated throughout the session via total communication strategies with a combination of gestures, verbal responses, cued verbal response  with [employer], and by selecting appropriate icons on the [employer] device. Pt was able to select all the family members she saw for the 4th of July and spontaneously say their names after seeing icon or by repeating after the icon selected.  She selected food items eaten, discussed friends, and completed some activities on the therapy  activities page. She continues to benefit from caregiver/SLP support to help with device navigation, but once she is on the appropriate page, she is able to select the icons she wants. Next session, will ask a set list of 10 questions which require page navigation to see exactly how much assist is needed. Ongoing Pt/caregiver education completed and plan to continue over the next month for use of the device.     Thank you,  Genene Churn, [place]  [place], [place] 08/07/2021, 1:16 PM

## 2021-08-12 ENCOUNTER — Ambulatory Visit (HOSPITAL_COMMUNITY): Payer: Medicare Other | Admitting: Speech Pathology

## 2021-08-12 ENCOUNTER — Encounter (HOSPITAL_COMMUNITY): Payer: Self-pay | Admitting: Speech Pathology

## 2021-08-12 DIAGNOSIS — R4701 Aphasia: Secondary | ICD-10-CM

## 2021-08-12 DIAGNOSIS — R41841 Cognitive communication deficit: Secondary | ICD-10-CM

## 2021-08-12 DIAGNOSIS — I69890 Apraxia following other cerebrovascular disease: Secondary | ICD-10-CM | POA: Diagnosis not present

## 2021-08-12 NOTE — Therapy (Addendum)
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 She was discharged from [facility] inpatient rehab on 01/15/21 and seen for outpatient SLP therapy from 01/24/21-03/26/21. She was discharged from SLP services at the request from the patient and her family due to Pt feeling frustrated about her aphasia. She made slow, but good progress during outpatient SLP therapy with [place] Aphasia Screening Tool (MAST) scores improving from 2/50 to 15/50 in Expressive portion and 26/50 to 28/50 for the Receptive portion.   END OF SESSION:   End of Session - 08/12/21 1314     Visit Number 13    Number of Visits 17   Date for SLP Re-Evaluation 08/29/21    Authorization Type [employer] Medicare    Progress Note Due on Visit --   20th visit   SLP Start Time 0945    SLP Stop Time  1030    SLP Time Calculation (min) 45 min    Activity Tolerance Patient tolerated treatment well             Past Medical History:  Diagnosis Date   Hypertension    TMJ (dislocation of temporomandibular joint)    Past Surgical History:  Procedure Laterality Date   ABDOMINAL HYSTERECTOMY     CHOLECYSTECTOMY     IR ANGIO INTRA EXTRACRAN SEL INTERNAL CAROTID BILAT MOD SED  09/16/2016   IR ANGIO VERTEBRAL SEL VERTEBRAL BILAT MOD SED  09/16/2016   MASS EXCISION Left 02/10/2020   Procedure: excision of soft tissue mass left upper back;  Surgeon: Jesusita Oka, MD;   Location: [place];  Service: General;  Laterality: Left;   Patient Active Problem List   Diagnosis Date Noted   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   Aphasia as late effect of cerebrovascular accident 04/24/2021   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 ([place]) 04/24/2021   Decreased estrogen level 04/24/2021   Diminished vision 04/24/2021   Heart murmur 04/24/2021   Lipoma 04/24/2021   Pure hypercholesterolemia 04/24/2021   Slow transit constipation    Dyslipidemia    Left middle cerebral artery stroke ([place]) 01/02/2021   Chronic heart failure with preserved ejection fraction (HFpEF) ([place]) 01/02/2021   Acute ischemic stroke ([place]) 12/26/2020   Essential hypertension 12/26/2020   Mixed hyperlipidemia 12/26/2020   Acute ischemic left MCA stroke ([place]) 12/26/2020     Rationale for Evaluation and Treatment Rehabilitation  SUBJECTIVE: "What's that?"  PAIN:  Are you having pain? No    GOALS: Goals reviewed with patient? Yes   SHORT TERM GOALS: Target date: 08/29/2021   Pt will provide a gesture for set list of 20 words with 90% effectiveness and mi/mod prompt via modeling from SLP.    Baseline: 75% max assist  Goal status: Partially Met/discontinue   2.  Pt will participate in a 2-week trial using alternative communication devices to communicate with her family and therapist due to the severity of her  aphasia. Baseline: Low tech versions only at this time Goal status: Met   3.  Pt will increase naming of common objects/pictures to 80% acc when provided with mi/mod cues.  Baseline: mod/max cues Goal status: Partially Met   4.  Patient will point to objects and object pictures/photos when named in field of 5 with 90% accuracy and cue for error awareness prn.  Baseline: 60% Goal status: Met   5.  Pt will answer personally relevant questions regarding preferences with 80% acc when provided mi/mod assist  and total communication options. Baseline: Max assist Goal status: Ongoing  6. Pt will initiate communication about a shared interest through use of alternative communication systems with indirect prompt over 3 consecutive sessions.   Baseline: moderate prompts  Goal status: Ongoing  7. Pt will respond to personal preference questions by answering with alternative communication device with 95% acc and min assist.   Baseline: moderate prompts  Goal status: Ongoing  8. Client will use device to cue themselves to speak (reading or imitating words on device) during 1:1 conversations with SLP with indirect cues over 3 consecutive sessions.    Baseline: moderate prompts  Goal status: Ongoing   LONG TERM GOALS: Target date: 08/29/2021   SLP LONG TERM GOAL #1  Pt will communicate basic wants/needs to [facility] via multimodality communication strategies with mi/mod assist from caregivers.  Baseline: Max assist    Goal status: Ongoing   SLP LONG TERM GOAL #2 Pt will increase auditory comprehension for basic level information to [facility] with use of multimodality cues and min assist from caregivers.  Baseline: mi/mod assist    Goal status: Ongoing   ASSESSMENT:   CLINICAL IMPRESSION: Patient is a 71 y.o. female who was seen today for a speech/language evaluation due to residual severe aphasia from her stroke on 12/25/2020. Pt presents with dysnomia, perseverative response interference, impaired initiation, premorbid stutter, impaired phrase repetition, good automatic speech when provided with initial cueing, good understanding of basic yes/no questions and object identification in field of 4, and difficulty following oral/written directions. Pt was previously given a communication template filled out with personally relevant vocabulary, however her family members indicate that they primarily communicate with her by asking her yes/no questions and pointing which is laborious, frustrating, and ineffective  to both the patient and family. Pt benefits from multimodality communication strategies to elicit word productions (including melodic intonation, phrase completion, modeling, gestures, breaking perseverative responses, and written cues). Pt and family would like for Pt to participate in SLP therapy again to improve communication skills. They are open to exploring alternative communication options as well.    OBJECTIVE IMPAIRMENTS include executive functioning, expressive language, receptive language, and aphasia. These impairments are limiting patient from managing medications, managing appointments, managing finances, and effectively communicating at home and in community. Factors affecting potential to achieve goals and functional outcome are ability to learn/carryover information, cooperation/participation level, and severity of impairments. Patient will benefit from skilled SLP services to address above impairments and improve overall function.   REHAB POTENTIAL: Fair Pt with severe expressive aphasia   PLAN: SLP FREQUENCY: 2x/week   SLP DURATION: 4 weeks   PLANNED INTERVENTIONS: Cueing hierachy, Cognitive reorganization, Internal/external aids, Functional tasks, Multimodal communication approach, SLP instruction and feedback, Compensatory strategies, Patient/family education, and trial of alternative communication systems  OBJECTIVE:   TODAY'S TREATMENT: Pt accompanied to therapy with her granddaughter, Merleen Nicely. Pt communicated throughout the session via total communication strategies with a combination of gestures, verbal responses, cued verbal  response with Lingraphica, and by selecting appropriate icons on the Lingraphica device. SLP and Pt devised a list of 10 questions for her to answer on the device which involved information regarding: birthday, place of residence, city, family doctor, family members, television shows, and hobbies. She answered all 10 questions with 90% acc with min/mod  cues for device navigation. Pt initiated two topics of conversation by selecting the plant icon and family member icon to show that she added new pictures and plants. Pt also assisted SLP in selecting appropriate questions she might like to ask others and these were placed in the conversation tab. Ongoing Pt/caregiver education completed and plan to continue over the next month for use of the device.     Thank you,  Genene Churn, [place]  [place], [place] 08/12/2021, 9:54 AM

## 2021-08-14 ENCOUNTER — Ambulatory Visit (HOSPITAL_COMMUNITY): Payer: Medicare Other | Admitting: Speech Pathology

## 2021-08-14 DIAGNOSIS — R4701 Aphasia: Secondary | ICD-10-CM | POA: Diagnosis not present

## 2021-08-14 DIAGNOSIS — I69890 Apraxia following other cerebrovascular disease: Secondary | ICD-10-CM | POA: Diagnosis not present

## 2021-08-14 DIAGNOSIS — R41841 Cognitive communication deficit: Secondary | ICD-10-CM

## 2021-08-14 NOTE — Therapy (Addendum)
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 She was discharged from [facility] inpatient rehab on 01/15/21 and seen for outpatient SLP therapy from 01/24/21-03/26/21. She was discharged from SLP services at the request from the patient and her family due to Pt feeling frustrated about her aphasia. She made slow, but good progress during outpatient SLP therapy with [place] Aphasia Screening Tool (MAST) scores improving from 2/50 to 15/50 in Expressive portion and 26/50 to 28/50 for the Receptive portion.   END OF SESSION:   End of Session - 08/12/21 1314     Visit Number 14   Number of Visits 17   Date for SLP Re-Evaluation 08/29/21    Authorization Type [employer] Medicare    Progress Note Due on Visit --   20th visit   SLP Start Time 0945    SLP Stop Time  1030    SLP Time Calculation (min) 45 min    Activity Tolerance Patient tolerated treatment well             Past Medical History:  Diagnosis Date   Hypertension    TMJ (dislocation of temporomandibular joint)    Past Surgical History:  Procedure Laterality Date   ABDOMINAL HYSTERECTOMY     CHOLECYSTECTOMY     IR ANGIO INTRA EXTRACRAN SEL INTERNAL CAROTID BILAT MOD SED  09/16/2016   IR ANGIO VERTEBRAL SEL VERTEBRAL BILAT MOD SED  09/16/2016   MASS EXCISION Left 02/10/2020   Procedure: excision of soft tissue mass left upper back;  Surgeon: Jesusita Oka, MD;   Location: [place];  Service: General;  Laterality: Left;   Patient Active Problem List   Diagnosis Date Noted   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[place]) 05/07/2021   Aphasia as late effect of cerebrovascular accident 04/24/2021   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 ([place]) 04/24/2021   Decreased estrogen level 04/24/2021   Diminished vision 04/24/2021   Heart murmur 04/24/2021   Lipoma 04/24/2021   Pure hypercholesterolemia 04/24/2021   Slow transit constipation    Dyslipidemia    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 No    GOALS: Goals reviewed with patient? Yes   SHORT TERM GOALS: Target date: 08/29/2021   Pt will provide a gesture for set list of 20 words with 90% effectiveness and mi/mod prompt via modeling from SLP.    Baseline: 75% max assist  Goal status: Partially Met/discontinue   2.  Pt will participate in a 2-week trial using alternative communication devices to communicate with her family and therapist due to the severity of her aphasia. Baseline:  Low tech versions only at this time Goal status: Met   3.  Pt will increase naming of common objects/pictures to 80% acc when provided with mi/mod cues.  Baseline: mod/max cues Goal status: Partially Met   4.  Patient will point to objects and object pictures/photos when named in field of 5 with 90% accuracy and cue for error awareness prn.  Baseline: 60% Goal status: Met   5.  Pt will answer personally relevant questions regarding preferences with 80% acc when provided mi/mod assist and  total communication options. Baseline: Max assist Goal status: Ongoing  6. Pt will initiate communication about a shared interest through use of alternative communication systems with indirect prompt over 3 consecutive sessions.   Baseline: moderate prompts  Goal status: Ongoing  7. Pt will respond to personal preference questions by answering with alternative communication device with 95% acc and min assist.   Baseline: moderate prompts  Goal status: Ongoing  8. Client will use device to cue themselves to speak (reading or imitating words on device) during 1:1 conversations with SLP with indirect cues over 3 consecutive sessions.    Baseline: moderate prompts  Goal status: Ongoing   LONG TERM GOALS: Target date: 08/29/2021   SLP LONG TERM GOAL #1  Pt will communicate basic wants/needs to [facility] via multimodality communication strategies with mi/mod assist from caregivers.  Baseline: Max assist    Goal status: Ongoing   SLP LONG TERM GOAL #2 Pt will increase auditory comprehension for basic level information to [facility] with use of multimodality cues and min assist from caregivers.  Baseline: mi/mod assist    Goal status: Ongoing   ASSESSMENT:   CLINICAL IMPRESSION: Patient is a 71 y.o. female who was seen today for a speech/language evaluation due to residual severe aphasia from her stroke on 12/25/2020. Pt presents with dysnomia, perseverative response interference, impaired initiation, premorbid stutter, impaired phrase repetition, good automatic speech when provided with initial cueing, good understanding of basic yes/no questions and object identification in field of 4, and difficulty following oral/written directions. Pt was previously given a communication template filled out with personally relevant vocabulary, however her family members indicate that they primarily communicate with her by asking her yes/no questions and pointing which is laborious, frustrating, and ineffective to  both the patient and family. Pt benefits from multimodality communication strategies to elicit word productions (including melodic intonation, phrase completion, modeling, gestures, breaking perseverative responses, and written cues). Pt and family would like for Pt to participate in SLP therapy again to improve communication skills. They are open to exploring alternative communication options as well.    OBJECTIVE IMPAIRMENTS include executive functioning, expressive language, receptive language, and aphasia. These impairments are limiting patient from managing medications, managing appointments, managing finances, and effectively communicating at home and in community. Factors affecting potential to achieve goals and functional outcome are ability to learn/carryover information, cooperation/participation level, and severity of impairments. Patient will benefit from skilled SLP services to address above impairments and improve overall function.   REHAB POTENTIAL: Fair Pt with severe expressive aphasia   PLAN: SLP FREQUENCY: 2x/week   SLP DURATION: 4 weeks   PLANNED INTERVENTIONS: Cueing hierachy, Cognitive reorganization, Internal/external aids, Functional tasks, Multimodal communication approach, SLP instruction and feedback, Compensatory strategies, Patient/family education, and trial of alternative communication systems  OBJECTIVE:   TODAY'S TREATMENT: Pt accompanied to therapy with her granddaughter, Merleen Nicely. Pt communicated throughout the session via total communication strategies with a combination of gestures, verbal responses, cued verbal response with  Lingraphica, and by selecting appropriate icons on the Lingraphica device. Pt wanted to communicate two topics to SLP and she initially pointed to Susan Moore to share the information. SLP cued Pt to wait until we were seated in the room to have her try to communicate her intent. She was cued to use the Lingraphica and was able to select an icon of  her nephew who is coming to visit and share that she needs to reschedule her Monday's appointment. She used her device to communicate with a volunteer in session and asked 4+ questions and answered the same. She initially reported feeling nervous to communicate with a novel conversation partner, but then seemed relaxed and laughed during the session. Ongoing Pt/caregiver education completed and plan to continue over the next month for use of the device.     Thank you,  Genene Churn, [place]  [place], [place] 08/14/2021, 11:52 AM

## 2021-08-19 ENCOUNTER — Ambulatory Visit (HOSPITAL_COMMUNITY): Payer: TRICARE For Life (TFL) | Admitting: Speech Pathology

## 2021-08-21 ENCOUNTER — Ambulatory Visit (HOSPITAL_COMMUNITY): Payer: TRICARE For Life (TFL) | Admitting: Speech Pathology

## 2021-08-21 DIAGNOSIS — R7303 Prediabetes: Secondary | ICD-10-CM | POA: Diagnosis not present

## 2021-08-21 DIAGNOSIS — I700 Atherosclerosis of aorta: Secondary | ICD-10-CM | POA: Diagnosis not present

## 2021-08-21 DIAGNOSIS — I679 Cerebrovascular disease, unspecified: Secondary | ICD-10-CM | POA: Diagnosis not present

## 2021-08-21 DIAGNOSIS — Z0000 Encounter for general adult medical examination without abnormal findings: Secondary | ICD-10-CM | POA: Diagnosis not present

## 2021-08-21 DIAGNOSIS — I10 Essential (primary) hypertension: Secondary | ICD-10-CM | POA: Diagnosis not present

## 2021-08-21 DIAGNOSIS — I69320 Aphasia following cerebral infarction: Secondary | ICD-10-CM | POA: Diagnosis not present

## 2021-08-21 DIAGNOSIS — E785 Hyperlipidemia, unspecified: Secondary | ICD-10-CM | POA: Diagnosis not present

## 2021-08-21 DIAGNOSIS — I69959 Hemiplegia and hemiparesis following unspecified cerebrovascular disease affecting unspecified side: Secondary | ICD-10-CM | POA: Diagnosis not present

## 2021-08-28 ENCOUNTER — Encounter (HOSPITAL_COMMUNITY): Payer: Self-pay | Admitting: Speech Pathology

## 2021-08-28 ENCOUNTER — Ambulatory Visit (HOSPITAL_COMMUNITY): Payer: Medicare Other | Admitting: Speech Pathology

## 2021-08-28 DIAGNOSIS — I639 Cerebral infarction, unspecified: Secondary | ICD-10-CM | POA: Diagnosis not present

## 2021-08-28 DIAGNOSIS — R482 Apraxia: Secondary | ICD-10-CM | POA: Diagnosis not present

## 2021-08-28 DIAGNOSIS — R4701 Aphasia: Secondary | ICD-10-CM | POA: Diagnosis not present

## 2021-08-28 DIAGNOSIS — I69890 Apraxia following other cerebrovascular disease: Secondary | ICD-10-CM | POA: Diagnosis not present

## 2021-08-28 DIAGNOSIS — R41841 Cognitive communication deficit: Secondary | ICD-10-CM

## 2021-08-28 NOTE — Therapy (Signed)
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 She was discharged from [facility] inpatient rehab on 01/15/21 and seen for outpatient SLP therapy from 01/24/21-03/26/21. She was discharged from SLP services at the request from the patient and her family due to Pt feeling frustrated about her aphasia. She made slow, but good progress during outpatient SLP therapy with [place] Aphasia Screening Tool (MAST) scores improving from 2/50 to 15/50 in Expressive portion and 26/50 to 28/50 for the Receptive portion.   END OF SESSION:   End of Session - 08/28/21 1314     Visit Number 15   Number of Visits 17   Date for SLP Re-Evaluation 08/29/21    Authorization Type [employer]    Progress Note Due on Visit --   20th visit   SLP Start Time 0945    SLP Stop Time  1030    SLP Time Calculation (min) 45 min    Activity Tolerance Patient tolerated treatment well             Past Medical History:  Diagnosis Date   Hypertension    TMJ (dislocation of temporomandibular joint)    Past Surgical History:  Procedure Laterality Date   ABDOMINAL HYSTERECTOMY     CHOLECYSTECTOMY     IR ANGIO INTRA EXTRACRAN SEL INTERNAL CAROTID BILAT MOD SED  09/16/2016   IR ANGIO VERTEBRAL SEL VERTEBRAL BILAT MOD SED  09/16/2016   MASS EXCISION Left 02/10/2020   Procedure: excision of soft tissue mass left upper back;  Surgeon: Jesusita Oka, MD;   Location: [place];  Service: General;  Laterality: Left;   Patient Active Problem List   Diagnosis Date Noted   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   Aphasia as late effect of cerebrovascular accident 04/24/2021   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 ([place]) 04/24/2021   Decreased estrogen level 04/24/2021   Diminished vision 04/24/2021   Heart murmur 04/24/2021   Lipoma 04/24/2021   Pure hypercholesterolemia 04/24/2021   Slow transit constipation    Dyslipidemia    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 No    GOALS: Goals reviewed with patient? Yes   SHORT TERM GOALS: Target date: 08/29/2021   Pt will provide a gesture for set list of 20 words with 90% effectiveness and mi/mod prompt via modeling from SLP.    Baseline: 75% max assist  Goal status: Partially Met/discontinue   2.  Pt will participate in a 2-week trial using alternative communication devices to communicate with her family and therapist due to the severity of  her aphasia. Baseline: Low tech versions only at this time Goal status: Met   3.  Pt will increase naming of common objects/pictures to 80% acc when provided with mi/mod cues.  Baseline: mod/max cues Goal status: Partially Met   4.  Patient will point to objects and object pictures/photos when named in field of 5 with 90% accuracy and cue for error awareness prn.  Baseline: 60% Goal status: Met   5.  Pt will answer personally relevant questions regarding preferences with 80% acc when provided mi/mod  assist and total communication options. Baseline: Max assist Goal status: Ongoing  6. Pt will initiate communication about a shared interest through use of alternative communication systems with indirect prompt over 3 consecutive sessions.   Baseline: moderate prompts  Goal status: Ongoing  7. Pt will respond to personal preference questions by answering with alternative communication device with 95% acc and min assist.   Baseline: moderate prompts  Goal status: Ongoing  8. Client will use device to cue themselves to speak (reading or imitating words on device) during 1:1 conversations with SLP with indirect cues over 3 consecutive sessions.    Baseline: moderate prompts  Goal status: Ongoing   LONG TERM GOALS: Target date: 08/29/2021   SLP LONG TERM GOAL #1  Pt will communicate basic wants/needs to [facility] via multimodality communication strategies with mi/mod assist from caregivers.  Baseline: Max assist    Goal status: Ongoing   SLP LONG TERM GOAL #2 Pt will increase auditory comprehension for basic level information to [facility] with use of multimodality cues and min assist from caregivers.  Baseline: mi/mod assist    Goal status: Ongoing   ASSESSMENT:   CLINICAL IMPRESSION: Patient is a 71 y.o. female who was seen today for a speech/language evaluation due to residual severe aphasia from her stroke on 12/25/2020. Pt presents with dysnomia, perseverative response interference, impaired initiation, premorbid stutter, impaired phrase repetition, good automatic speech when provided with initial cueing, good understanding of basic yes/no questions and object identification in field of 4, and difficulty following oral/written directions. Pt was previously given a communication template filled out with personally relevant vocabulary, however her family members indicate that they primarily communicate with her by asking her yes/no questions and pointing which is laborious, frustrating, and  ineffective to both the patient and family. Pt benefits from multimodality communication strategies to elicit word productions (including melodic intonation, phrase completion, modeling, gestures, breaking perseverative responses, and written cues). Pt and family would like for Pt to participate in SLP therapy again to improve communication skills. They are open to exploring alternative communication options as well.    OBJECTIVE IMPAIRMENTS include executive functioning, expressive language, receptive language, and aphasia. These impairments are limiting patient from managing medications, managing appointments, managing finances, and effectively communicating at home and in community. Factors affecting potential to achieve goals and functional outcome are ability to learn/carryover information, cooperation/participation level, and severity of impairments. Patient will benefit from skilled SLP services to address above impairments and improve overall function.   REHAB POTENTIAL: Fair Pt with severe expressive aphasia   PLAN: SLP FREQUENCY: 2x/week   SLP DURATION: 4 weeks   PLANNED INTERVENTIONS: Cueing hierachy, Cognitive reorganization, Internal/external aids, Functional tasks, Multimodal communication approach, SLP instruction and feedback, Compensatory strategies, Patient/family education, and trial of alternative communication systems  OBJECTIVE:   TODAY'S TREATMENT: Pt accompanied to therapy with her granddaughter, Merleen Nicely. SLP facilitated session by asking targeted questions for [place] to use the Lingraphica to answer. She required  min assist for using the correct end of the stylus (can place red tape around hers once it arrives) and locating specific pages. She selected additional restaurants to add when provided min cues. She saw her PCP last week, but unfortunately, did not bring her device. SLP reiterated the importance of bringing her device to all of her medical appointments so that she  can express her thoughts/feelings and ask questions. Plan to update goals next session and request a few additional visits once her device arrives.   Thank you,  Genene Churn, [place]  [place], [place] 08/28/2021, 11:13 AM

## 2021-08-29 ENCOUNTER — Ambulatory Visit (HOSPITAL_COMMUNITY): Payer: Medicare Other | Admitting: Speech Pathology

## 2021-08-29 DIAGNOSIS — R41841 Cognitive communication deficit: Secondary | ICD-10-CM

## 2021-08-29 DIAGNOSIS — I69890 Apraxia following other cerebrovascular disease: Secondary | ICD-10-CM | POA: Diagnosis not present

## 2021-08-29 DIAGNOSIS — R4701 Aphasia: Secondary | ICD-10-CM

## 2021-08-29 NOTE — Therapy (Signed)
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 She was discharged from [facility] inpatient rehab on 01/15/21 and seen for outpatient SLP therapy from 01/24/21-03/26/21. She was discharged from SLP services at the request from the patient and her family due to Pt feeling frustrated about her aphasia. She made slow, but good progress during outpatient SLP therapy with [place] Aphasia Screening Tool (MAST) scores improving from 2/50 to 15/50 in Expressive portion and 26/50 to 28/50 for the Receptive portion.   END OF SESSION:   End of Session - 08/29/21 1314     Visit Number 16   Number of Visits 17   Date for SLP Re-Evaluation 08/29/21    Authorization Type [employer] Medicare    Progress Note Due on Visit --   20th visit   SLP Start Time 0945    SLP Stop Time  1030    SLP Time Calculation (min) 45 min    Activity Tolerance Patient tolerated treatment well             Past Medical History:  Diagnosis Date   Hypertension    TMJ (dislocation of temporomandibular joint)    Past Surgical History:  Procedure Laterality Date   ABDOMINAL HYSTERECTOMY     CHOLECYSTECTOMY     IR ANGIO INTRA EXTRACRAN SEL INTERNAL CAROTID BILAT MOD SED  09/16/2016   IR ANGIO VERTEBRAL SEL VERTEBRAL BILAT MOD SED  09/16/2016   MASS EXCISION Left 02/10/2020   Procedure: excision of soft tissue mass left upper back;  Surgeon: Jesusita Oka, MD;   Location: [place];  Service: General;  Laterality: Left;   Patient Active Problem List   Diagnosis Date Noted   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[place]) 05/07/2021   Aphasia as late effect of cerebrovascular accident 04/24/2021   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 ([place]) 04/24/2021   Decreased estrogen level 04/24/2021   Diminished vision 04/24/2021   Heart murmur 04/24/2021   Lipoma 04/24/2021   Pure hypercholesterolemia 04/24/2021   Slow transit constipation    Dyslipidemia    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 No    GOALS: Goals reviewed with patient? Yes   SHORT TERM GOALS: Target date: 08/29/2021   Pt will provide a gesture for set list of 20 words with 90% effectiveness and mi/mod prompt via modeling from SLP.    Baseline: 75% max assist  Goal status: Partially Met/discontinue   2.  Pt will participate in a 2-week trial using alternative communication devices to communicate with her family and therapist due to the severity of  her aphasia. Baseline: Low tech versions only at this time Goal status: Met   3.  Pt will increase naming of common objects/pictures to 80% acc when provided with mi/mod cues.  Baseline: mod/max cues Goal status: Partially Met   4.  Patient will point to objects and object pictures/photos when named in field of 5 with 90% accuracy and cue for error awareness prn.  Baseline: 60% Goal status: Met   5.  Pt will answer personally relevant questions regarding preferences with 80% acc when provided mi/mod  assist and total communication options. Baseline: Max assist Goal status: Met; change to 90% with min assist for total communication strategies (08/29/21)  6. Pt will initiate communication about a shared interest through use of alternative communication systems with indirect prompt over 3 consecutive sessions.   Baseline: moderate prompts  Goal status: Met  7. Pt will respond to personal preference questions by answering with alternative communication device with 95% acc and min assist.   Baseline: moderate prompts  Goal status: Partially Met/continue with her personalized device  8. Client will use device to cue themselves to speak (reading or imitating words on device) during 1:1 conversations with SLP with indirect cues over 3 consecutive sessions.    Baseline: moderate prompts  Goal status: Partially Met/Continue   LONG TERM GOALS: Target date: 09/29/2021   SLP LONG TERM GOAL #1  Pt will communicate basic wants/needs to [facility] via multimodality communication strategies with mi/mod assist from caregivers.  Baseline: Max assist    Goal status: Ongoing   SLP LONG TERM GOAL #2 Pt will increase auditory comprehension for basic level information to [facility] with use of multimodality cues and min assist from caregivers.  Baseline: mi/mod assist    Goal status: Ongoing   ASSESSMENT:   CLINICAL IMPRESSION: Patient is a 71 y.o. female who was seen today for a speech/language evaluation due to residual severe aphasia from her stroke on 12/25/2020. Pt presents with dysnomia, perseverative response interference, impaired initiation, premorbid stutter, impaired phrase repetition, good automatic speech when provided with initial cueing, good understanding of basic yes/no questions and object identification in field of 4, and difficulty following oral/written directions. Pt was previously given a communication template filled out with personally relevant vocabulary, however her family members indicate  that they primarily communicate with her by asking her yes/no questions and pointing which is laborious, frustrating, and ineffective to both the patient and family. Pt benefits from multimodality communication strategies to elicit word productions (including melodic intonation, phrase completion, modeling, gestures, breaking perseverative responses, and written cues). Pt and family would like for Pt to participate in SLP therapy again to improve communication skills. They are open to exploring alternative communication options as well.    OBJECTIVE IMPAIRMENTS include executive functioning, expressive language, receptive language, and aphasia. These impairments are limiting patient from managing medications, managing appointments, managing finances, and effectively communicating at home and in community. Factors affecting potential to achieve goals and functional outcome are ability to learn/carryover information, cooperation/participation level, and severity of impairments. Patient will benefit from skilled SLP services to address above impairments and improve overall function.   REHAB POTENTIAL: Fair Pt with severe expressive aphasia   PLAN: SLP FREQUENCY: 1x/week   SLP DURATION: 3 weeks   PLANNED INTERVENTIONS: Cueing hierachy, Cognitive reorganization, Internal/external aids, Functional tasks, Multimodal communication approach, SLP instruction and feedback, Compensatory strategies, Patient/family education, and trial of alternative communication systems  OBJECTIVE:   TODAY'S TREATMENT: Pt accompanied to therapy with her granddaughter, Merleen Nicely.  Pt communicated throughout the session via total communication strategies with a combination of gestures, verbal responses, cued verbal response with Lingraphica, and by selecting appropriate icons on the Lingraphica device. We received notice that her Adella Hare has been shipped and should arrive by tomorrow. [place] was able to communicate where she went  for lunch yesterday, what she ordered (chicken and dumplings), what she wanted to eat after today's session ([employer] milkshake), and was able to verbalize responses by verbal prompt from selecting appropriate icons. She continues to be more interactive in sessions and smiles frequently. Plan to see Pt 2-3 more sessions once her personalized device arrives before d/c from SLP therapy.    Thank you,  Genene Churn, [place]  [place], [place] 08/29/2021, 11:20 AM

## 2021-08-29 NOTE — Addendum Note (Signed)
Addended by: Ephraim Hamburger on: 08/29/2021 12:24 PM   Modules accepted: Orders

## 2021-09-05 ENCOUNTER — Ambulatory Visit (HOSPITAL_COMMUNITY): Payer: Medicare Other | Attending: Physician Assistant | Admitting: Speech Pathology

## 2021-09-05 DIAGNOSIS — R4701 Aphasia: Secondary | ICD-10-CM | POA: Insufficient documentation

## 2021-09-05 DIAGNOSIS — I69890 Apraxia following other cerebrovascular disease: Secondary | ICD-10-CM | POA: Diagnosis not present

## 2021-09-05 DIAGNOSIS — R41841 Cognitive communication deficit: Secondary | ICD-10-CM | POA: Diagnosis present

## 2021-09-05 NOTE — Therapy (Signed)
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 She was discharged from [facility] inpatient rehab on 01/15/21 and seen for outpatient SLP therapy from 01/24/21-03/26/21. She was discharged from SLP services at the request from the patient and her family due to Pt feeling frustrated about her aphasia. She made slow, but good progress during outpatient SLP therapy with [place] Aphasia Screening Tool (MAST) scores improving from 2/50 to 15/50 in Expressive portion and 26/50 to 28/50 for the Receptive portion.   END OF SESSION:   End of Session - 09/05/21 1314     Visit Number 16   Number of Visits 17   Date for SLP Re-Evaluation 09/29/21    Authorization Type [employer] Medicare    Progress Note Due on Visit --   20th visit   SLP Start Time 0945    SLP Stop Time  1030    SLP Time Calculation (min) 45 min    Activity Tolerance Patient tolerated treatment well             Past Medical History:  Diagnosis Date   Hypertension    TMJ (dislocation of temporomandibular joint)    Past Surgical History:  Procedure Laterality Date   ABDOMINAL HYSTERECTOMY     CHOLECYSTECTOMY     IR ANGIO INTRA EXTRACRAN SEL INTERNAL CAROTID BILAT MOD SED  09/16/2016   IR ANGIO VERTEBRAL SEL VERTEBRAL BILAT MOD SED  09/16/2016   MASS EXCISION Left 02/10/2020   Procedure: excision of soft tissue mass left upper back;  Surgeon: Jesusita Oka, MD;   Location: [place];  Service: General;  Laterality: Left;   Patient Active Problem List   Diagnosis Date Noted   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   Aphasia as late effect of cerebrovascular accident 04/24/2021   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 ([place]) 04/24/2021   Decreased estrogen level 04/24/2021   Diminished vision 04/24/2021   Heart murmur 04/24/2021   Lipoma 04/24/2021   Pure hypercholesterolemia 04/24/2021   Slow transit constipation    Dyslipidemia    Left middle cerebral artery stroke ([place]) 01/02/2021   Chronic heart failure with preserved ejection fraction (HFpEF) ([place]) 01/02/2021   Acute ischemic stroke ([place]) 12/26/2020   Essential hypertension 12/26/2020   Mixed hyperlipidemia 12/26/2020   Acute ischemic left MCA stroke ([place]) 12/26/2020     Rationale for Evaluation and Treatment Rehabilitation  SUBJECTIVE: "Oh Lord?"  PAIN:  Are you having pain? No    GOALS: Goals reviewed with patient? Yes   SHORT TERM GOALS: Target date: 08/29/2021   Pt will provide a gesture for set list of 20 words with 90% effectiveness and mi/mod prompt via modeling from SLP.    Baseline: 75% max assist  Goal status: Partially Met/discontinue   2.  Pt will participate in a 2-week trial using alternative communication devices to communicate with her family and therapist due to the severity of her aphasia.  Baseline: Low tech versions only at this time Goal status: Met   3.  Pt will increase naming of common objects/pictures to 80% acc when provided with mi/mod cues.  Baseline: mod/max cues Goal status: Partially Met   4.  Patient will point to objects and object pictures/photos when named in field of 5 with 90% accuracy and cue for error awareness prn.  Baseline: 60% Goal status: Met   5.  Pt will answer personally relevant questions regarding preferences with 80% acc when provided mi/mod assist and  total communication options. Baseline: Max assist Goal status: Met; change to 90% with min assist for total communication strategies (08/29/21)  6. Pt will initiate communication about a shared interest through use of alternative communication systems with indirect prompt over 3 consecutive sessions.   Baseline: moderate prompts  Goal status: Met  7. Pt will respond to personal preference questions by answering with alternative communication device with 95% acc and min assist.   Baseline: moderate prompts  Goal status: Partially Met/continue with her personalized device  8. Client will use device to cue themselves to speak (reading or imitating words on device) during 1:1 conversations with SLP with indirect cues over 3 consecutive sessions.    Baseline: moderate prompts  Goal status: Partially Met/Continue   LONG TERM GOALS: Target date: 09/29/2021   SLP LONG TERM GOAL #1  Pt will communicate basic wants/needs to [facility] via multimodality communication strategies with mi/mod assist from caregivers.  Baseline: Max assist    Goal status: Ongoing   SLP LONG TERM GOAL #2 Pt will increase auditory comprehension for basic level information to [facility] with use of multimodality cues and min assist from caregivers.  Baseline: mi/mod assist    Goal status: Ongoing   ASSESSMENT:   CLINICAL IMPRESSION: Patient is a 71 y.o. female who was seen today for a speech/language evaluation due to residual severe aphasia from her stroke on 12/25/2020. Pt presents with dysnomia, perseverative response interference, impaired initiation, premorbid stutter, impaired phrase repetition, good automatic speech when provided with initial cueing, good understanding of basic yes/no questions and object identification in field of 4, and difficulty following oral/written directions. Pt was previously given a communication template filled out with personally relevant vocabulary, however her family members indicate that they  primarily communicate with her by asking her yes/no questions and pointing which is laborious, frustrating, and ineffective to both the patient and family. Pt benefits from multimodality communication strategies to elicit word productions (including melodic intonation, phrase completion, modeling, gestures, breaking perseverative responses, and written cues). Pt and family would like for Pt to participate in SLP therapy again to improve communication skills. They are open to exploring alternative communication options as well.    OBJECTIVE IMPAIRMENTS include executive functioning, expressive language, receptive language, and aphasia. These impairments are limiting patient from managing medications, managing appointments, managing finances, and effectively communicating at home and in community. Factors affecting potential to achieve goals and functional outcome are ability to learn/carryover information, cooperation/participation level, and severity of impairments. Patient will benefit from skilled SLP services to address above impairments and improve overall function.   REHAB POTENTIAL: Fair Pt with severe expressive aphasia   PLAN: SLP FREQUENCY: 1x/week   SLP DURATION: 3 weeks   PLANNED INTERVENTIONS: Cueing hierachy, Cognitive reorganization, Internal/external aids, Functional tasks, Multimodal communication approach, SLP instruction and feedback, Compensatory strategies, Patient/family education, and trial of alternative communication systems  OBJECTIVE:   TODAY'S TREATMENT: Pt accompanied to therapy with her granddaughter, Merleen Nicely. Pt communicated  throughout the session via total communication strategies with a combination of gestures, verbal responses, cued verbal response with Lingraphica, and by selecting appropriate icons on the Lingraphica device. She received her permanent device and all information appears to have transferred. SLP worked with Pt to develop scripts for appointments and a  folder to list upcoming appointments. She is able to answer preference questions, select friends/family icons, identify wants/needs, and communicate personal biographical information if the listener assists with device navigation. She does not yet initiate using the device independently. Plan for one more SLP session to review use of device going forward in preparation for discharge from therapy. Pt and Merleen Nicely are in agreement with plan of care.    Thank you,  Genene Churn, [place]  [place], [place] 09/05/2021, 11:49 AM

## 2021-09-12 ENCOUNTER — Encounter (HOSPITAL_COMMUNITY): Payer: Self-pay | Admitting: Speech Pathology

## 2021-09-12 ENCOUNTER — Ambulatory Visit (HOSPITAL_COMMUNITY): Payer: Medicare Other | Admitting: Speech Pathology

## 2021-09-12 DIAGNOSIS — R4701 Aphasia: Secondary | ICD-10-CM

## 2021-09-12 DIAGNOSIS — R41841 Cognitive communication deficit: Secondary | ICD-10-CM

## 2021-09-12 DIAGNOSIS — I69890 Apraxia following other cerebrovascular disease: Secondary | ICD-10-CM

## 2021-09-12 NOTE — Therapy (Signed)
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 She was discharged from [facility] inpatient rehab on 01/15/21 and seen for outpatient SLP therapy from 01/24/21-03/26/21. She was discharged from SLP services at the request from the patient and her family due to Pt feeling frustrated about her aphasia. She made slow, but good progress during outpatient SLP therapy with [place] Aphasia Screening Tool (MAST) scores improving from 2/50 to 15/50 in Expressive portion and 26/50 to 28/50 for the Receptive portion.   END OF SESSION:   End of Session - 09/12/21 1314     Visit Number 17   Number of Visits 17   Date for SLP Re-Evaluation 09/29/21    Authorization Type [employer] Medicare    Progress Note Due on Visit --   20th visit   SLP Start Time 1040    SLP Stop Time  1134   SLP Time Calculation (min) 54 min    Activity Tolerance Patient tolerated treatment well             Past Medical History:  Diagnosis Date   Hypertension    TMJ (dislocation of temporomandibular joint)    Past Surgical History:  Procedure Laterality Date   ABDOMINAL HYSTERECTOMY     CHOLECYSTECTOMY     IR ANGIO INTRA EXTRACRAN SEL INTERNAL CAROTID BILAT MOD SED  09/16/2016   IR ANGIO VERTEBRAL SEL VERTEBRAL BILAT MOD SED  09/16/2016   MASS EXCISION Left 02/10/2020   Procedure: excision of soft tissue mass left upper back;  Surgeon: Jesusita Oka, MD;   Location: [place];  Service: General;  Laterality: Left;   Patient Active Problem List   Diagnosis Date Noted   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[place]) 05/07/2021   Aphasia as late effect of cerebrovascular accident 04/24/2021   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 ([place]) 04/24/2021   Decreased estrogen level 04/24/2021   Diminished vision 04/24/2021   Heart murmur 04/24/2021   Lipoma 04/24/2021   Pure hypercholesterolemia 04/24/2021   Slow transit constipation    Dyslipidemia    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 No    GOALS: Goals reviewed with patient? Yes   SHORT TERM GOALS: Target date: 09/29/2021   Pt will provide a gesture for set list of 20 words with 90% effectiveness and mi/mod prompt via modeling from SLP.    Baseline: 75% max assist  Goal status: Partially Met/discontinue   2.  Pt will participate in a 2-week trial using alternative communication devices to communicate with her family and therapist due to the severity of her  aphasia. Baseline: Low tech versions only at this time Goal status: Met   3.  Pt will increase naming of common objects/pictures to 80% acc when provided with mi/mod cues.  Baseline: mod/max cues Goal status: Partially Met   4.  Patient will point to objects and object pictures/photos when named in field of 5 with 90% accuracy and cue for error awareness prn.  Baseline: 60% Goal status: Met   5.  Pt will answer personally relevant questions regarding preferences with 80% acc when provided  mi/mod assist and total communication options. Baseline: Max assist Goal status: Met; change to 90% with min assist for total communication strategies (08/29/21) MET  6. Pt will initiate communication about a shared interest through use of alternative communication systems with indirect prompt over 3 consecutive sessions.   Baseline: moderate prompts  Goal status: Met  7. Pt will respond to personal preference questions by answering with alternative communication device with 95% acc and min assist.   Baseline: moderate prompts  Goal status: Partially Met/continue with her personalized device- MET  8. Client will use device to cue themselves to speak (reading or imitating words on device) during 1:1 conversations with SLP with indirect cues over 3 consecutive sessions.    Baseline: moderate prompts  Goal status: MET   LONG TERM GOALS: Target date: 09/29/2021   SLP LONG TERM GOAL #1  Pt will communicate basic wants/needs to [facility] via multimodality communication strategies with mi/mod assist from caregivers.  Baseline: Max assist    Goal status: MET SLP LONG TERM GOAL #2 Pt will increase auditory comprehension for basic level information to [facility] with use of multimodality cues and min assist from caregivers.  Baseline: mi/mod assist    Goal status: MET   ASSESSMENT:   CLINICAL IMPRESSION: Patient is a 71 y.o. female who was seen today for a speech/language evaluation due to residual severe aphasia from her stroke on 12/25/2020. Pt presents with dysnomia, perseverative response interference, impaired initiation, premorbid stutter, impaired phrase repetition, good automatic speech when provided with initial cueing, good understanding of basic yes/no questions and object identification in field of 4, and difficulty following oral/written directions. Pt was previously given a communication template filled out with personally relevant vocabulary, however her family members indicate that they  primarily communicate with her by asking her yes/no questions and pointing which is laborious, frustrating, and ineffective to both the patient and family. Pt benefits from multimodality communication strategies to elicit word productions (including melodic intonation, phrase completion, modeling, gestures, breaking perseverative responses, and written cues). Pt and family would like for Pt to participate in SLP therapy again to improve communication skills. They are open to exploring alternative communication options as well.    OBJECTIVE IMPAIRMENTS include executive functioning, expressive language, receptive language, and aphasia. These impairments are limiting patient from managing medications, managing appointments, managing finances, and effectively communicating at home and in community. Factors affecting potential to achieve goals and functional outcome are ability to learn/carryover information, cooperation/participation level, and severity of impairments. Patient will benefit from skilled SLP services to address above impairments and improve overall function.   REHAB POTENTIAL: Fair Pt with severe expressive aphasia    TODAY'S TREATMENT: Pt accompanied to therapy with her granddaughter, Merleen Nicely. Pt communicated throughout the session via total communication strategies with a combination of gestures, verbal responses, cued verbal response with Lingraphica, and by selecting appropriate icons on the Lingraphica device. She is able to answer preference questions, select friends/family icons, identify wants/needs, and communicate  personal biographical information if the listener assists with device navigation. She does not yet initiate using the device independently. Merleen Nicely added a Bible application to the device, however it was very difficult to navigate to- SLP will reach out to [place] to see if there is a more efficient way to access this. SLP showed the Pt the [place] News section and Pt  could follow along with the news stories and answer questions with mi/mod assist. She met her goals with use of the device and will be discharged from therapy. Pt and family were encouraged to facilitate use of the Lingraphica at home to continue supporting Pt's communication.   SPEECH THERAPY DISCHARGE SUMMARY  Visits from Start of Care: 17  Current functional level related to goals / functional outcomes: Goals met, see above   Remaining deficits: Persistent severe expressive aphasia, uses Lingraphica to communicate   Education / Equipment: Complete, continue with use of Lingraphica device   Patient agrees to discharge. Patient goals were met. Patient is being discharged due to being pleased with the current functional level..    Thank you,  Genene Churn, [place]  [place], [place] 09/12/2021, 2:59 PM

## 2021-10-02 ENCOUNTER — Ambulatory Visit: Payer: Self-pay

## 2021-10-02 NOTE — Patient Outreach (Signed)
  Care Coordination   10/02/2021 Name: TABITA CORBO MRN: [id number] DOB: Mar 11, 1950   Care Coordination Outreach Attempts:  An unsuccessful telephone outreach was attempted today to offer the patient information about available care coordination services as a benefit of their health plan.   Follow Up Plan:  Additional outreach attempts will be made to offer the patient care coordination information and services.   Encounter Outcome:  No Answer  Care Coordination Interventions Activated:  No   Care Coordination Interventions:  No, not indicated    Lazaro Arms RN, BSN, [facility]   Phone: [phone]

## 2021-10-22 ENCOUNTER — Other Ambulatory Visit (HOSPITAL_COMMUNITY): Payer: Self-pay | Admitting: Family Medicine

## 2021-10-22 DIAGNOSIS — Z1231 Encounter for screening mammogram for malignant neoplasm of breast: Secondary | ICD-10-CM

## 2021-10-29 ENCOUNTER — Encounter (HOSPITAL_COMMUNITY): Payer: Self-pay | Admitting: *Deleted

## 2021-10-29 ENCOUNTER — Emergency Department (HOSPITAL_COMMUNITY)
Admission: EM | Admit: 2021-10-29 | Discharge: 2021-10-29 | Disposition: A | Discharge status: Home / Self Care | Payer: Medicare Other | Attending: Emergency Medicine | Admitting: Emergency Medicine

## 2021-10-29 ENCOUNTER — Emergency Department (HOSPITAL_COMMUNITY): Payer: Medicare Other

## 2021-10-29 ENCOUNTER — Other Ambulatory Visit: Payer: Self-pay

## 2021-10-29 DIAGNOSIS — I10 Essential (primary) hypertension: Secondary | ICD-10-CM | POA: Insufficient documentation

## 2021-10-29 DIAGNOSIS — Z7902 Long term (current) use of antithrombotics/antiplatelets: Secondary | ICD-10-CM | POA: Diagnosis not present

## 2021-10-29 DIAGNOSIS — N3001 Acute cystitis with hematuria: Secondary | ICD-10-CM | POA: Insufficient documentation

## 2021-10-29 DIAGNOSIS — K5900 Constipation, unspecified: Secondary | ICD-10-CM | POA: Diagnosis not present

## 2021-10-29 DIAGNOSIS — D72829 Elevated white blood cell count, unspecified: Secondary | ICD-10-CM | POA: Diagnosis not present

## 2021-10-29 DIAGNOSIS — Z6822 Body mass index (BMI) 22.0-22.9, adult: Secondary | ICD-10-CM | POA: Diagnosis not present

## 2021-10-29 DIAGNOSIS — R030 Elevated blood-pressure reading, without diagnosis of hypertension: Secondary | ICD-10-CM | POA: Diagnosis not present

## 2021-10-29 DIAGNOSIS — Z79899 Other long term (current) drug therapy: Secondary | ICD-10-CM | POA: Diagnosis not present

## 2021-10-29 DIAGNOSIS — N281 Cyst of kidney, acquired: Secondary | ICD-10-CM | POA: Diagnosis not present

## 2021-10-29 DIAGNOSIS — R1084 Generalized abdominal pain: Secondary | ICD-10-CM | POA: Diagnosis present

## 2021-10-29 DIAGNOSIS — R1032 Left lower quadrant pain: Secondary | ICD-10-CM | POA: Diagnosis not present

## 2021-10-29 LAB — COMPREHENSIVE METABOLIC PANEL
ALT: 31 U/L (ref 0–44)
AST: 40 U/L (ref 15–41)
Albumin: 4.6 g/dL (ref 3.5–5.0)
Alkaline Phosphatase: 122 U/L (ref 38–126)
Anion gap: 11 (ref 5–15)
BUN: 13 mg/dL (ref 8–23)
CO2: 23 mmol/L (ref 22–32)
Calcium: 9.6 mg/dL (ref 8.9–10.3)
Chloride: 102 mmol/L (ref 98–111)
Creatinine, Ser: 0.72 mg/dL (ref 0.44–1.00)
GFR, Estimated: >60 mL/min (ref >60)
Glucose, Bld: 134 mg/dL — ABNORMAL HIGH (ref 70–99)
Potassium: 4.1 mmol/L (ref 3.5–5.1)
Sodium: 136 mmol/L (ref 135–145)
Total Bilirubin: 0.8 mg/dL (ref 0.3–1.2)
Total Protein: 8 g/dL (ref 6.5–8.1)

## 2021-10-29 LAB — CBC
HCT: 36 % (ref 36.0–46.0)
Hemoglobin: 12.4 g/dL (ref 12.0–15.0)
MCH: 32.5 pg (ref 26.0–34.0)
MCHC: 34.4 g/dL (ref 30.0–36.0)
MCV: 94.2 fL (ref 80.0–100.0)
Platelets: 267 10*3/uL (ref 150–400)
RBC: 3.82 MIL/uL — ABNORMAL LOW (ref 3.87–5.11)
RDW: 13.2 % (ref 11.5–15.5)
WBC: 12.1 10*3/uL — ABNORMAL HIGH (ref 4.0–10.5)
nRBC: 0 % (ref 0.0–0.2)

## 2021-10-29 LAB — URINALYSIS, ROUTINE W REFLEX MICROSCOPIC
Bilirubin Urine: NEGATIVE
Glucose, UA: NEGATIVE mg/dL
Ketones, ur: NEGATIVE mg/dL
Nitrite: NEGATIVE
Protein, ur: 100 mg/dL — AB
RBC / HPF: >50 RBC/hpf — ABNORMAL HIGH (ref 0–5)
Specific Gravity, Urine: 1.033 — ABNORMAL HIGH (ref 1.005–1.030)
pH: 5 (ref 5.0–8.0)

## 2021-10-29 LAB — LIPASE, BLOOD: Lipase: 27 U/L (ref 11–51)

## 2021-10-29 MED ORDER — SULFAMETHOXAZOLE-TRIMETHOPRIM 800-160 MG PO TABS
1.0000 | ORAL_TABLET | Freq: Once | ORAL | Status: AC
  Administered 2021-10-29: 1 via ORAL
  Filled 2021-10-29: qty 1

## 2021-10-29 MED ORDER — FLEET ENEMA 7-19 GM/118ML RE ENEM
1.0000 | ENEMA | Freq: Once | RECTAL | Status: AC
  Administered 2021-10-29: 1 via RECTAL

## 2021-10-29 MED ORDER — SULFAMETHOXAZOLE-TRIMETHOPRIM 800-160 MG PO TABS: 1.0000 | ORAL_TABLET | Freq: Two times a day (BID) | ORAL | 0 refills | Status: AC

## 2021-10-29 MED ORDER — MORPHINE SULFATE (PF) 4 MG/ML IV SOLN
4.0000 mg | Freq: Once | INTRAVENOUS | Status: AC
Start: 2021-10-29 — End: 2021-10-29
  Administered 2021-10-29: 4 mg via INTRAVENOUS
  Filled 2021-10-29: qty 1

## 2021-10-29 MED ORDER — ONDANSETRON HCL 4 MG/2ML IJ SOLN
4.0000 mg | Freq: Once | INTRAMUSCULAR | Status: AC
  Administered 2021-10-29: 4 mg via INTRAVENOUS
  Filled 2021-10-29: qty 2

## 2021-10-29 MED ORDER — POLYETHYLENE GLYCOL 3350 17 GM/SCOOP PO POWD
17.0000 g | Freq: Every day | ORAL | 0 refills | Status: AC
Start: 2021-10-29 — End: ?

## 2021-10-29 MED ORDER — IOHEXOL 300 MG/ML  SOLN
100.0000 mL | Freq: Once | INTRAMUSCULAR | Status: AC | PRN
  Administered 2021-10-29: 100 mL via INTRAVENOUS

## 2021-10-29 MED ORDER — MINERAL OIL RE ENEM
1.0000 | ENEMA | Freq: Once | RECTAL | Status: AC
  Administered 2021-10-29: 1 via RECTAL
  Filled 2021-10-29: qty 1

## 2021-10-29 MED ORDER — BISACODYL 10 MG RE SUPP
10.0000 mg | Freq: Once | RECTAL | Status: AC
  Administered 2021-10-29: 10 mg via RECTAL
  Filled 2021-10-29: qty 1

## 2021-10-29 NOTE — ED Triage Notes (Addendum)
Pt with LLQ pain since yesterday. Denies any N/V/D.  Pt sent from UC due to concern for appendicitis per family member.

## 2021-10-29 NOTE — Discharge Instructions (Signed)
Refer to the instructions below for suggestions on ways to avoid constipation.  I am also recommending that you take a daily medication called MiraLAX to help keep your stools soft.  You are also being treated for urinary tract infection, take your next dose of the antibiotic tomorrow morning.  Make sure you are drinking plenty of fluids.  Follow-up with your primary doctor for recheck for any persistent or worsening symptoms.  I do recommend a repeat urinalysis after your antibiotics are completed to make sure the your urinary infection is gone, you can call your primary doctor for a lab visit for this test.

## 2021-10-29 NOTE — ED Provider Notes (Signed)
[facility] Provider Note   CSN: [id number] Arrival date & time: 10/29/21  1257     History  Chief Complaint  Patient presents with   Abdominal Pain    Natasha Vazquez is a 71 y.o. female with a history including hypertension, hyperlipidemia, CVA with residual aphasia, surgical history significant for cholecystectomy and abdominal hysterectomy presenting for evaluation of abdominal pain which started early this morning she endorses generalized pain but most intense in the left lower quadrant worsened with movement and palpation.  She does have a history of constipation however, has taken 2 OTC laxatives which did not result in a bowel movement but worsened her pain.  She does recall her last bm.  She has had no fevers or chills, she does have some nausea, denies vomiting.  She has found no alleviators for symptoms.  She has had no dysuria, back pain, chest pain, no shortness of breath.    The history is provided by the patient.       Home Medications Prior to Admission medications   Medication Sig Start Date End Date Taking? Authorizing Provider  acetaminophen (TYLENOL) 325 MG tablet Take 2 tablets (650 mg total) by mouth every 4 (four) hours as needed for mild pain (or temp > 37.5 C (99.5 F)). 01/14/21  Yes Angiulli, Lavon Paganini, PA-C  amLODipine (NORVASC) 10 MG tablet Take 10 mg by mouth daily. 07/18/21  Yes [provider]  carboxymethylcellulose (REFRESH PLUS) 0.5 % SOLN Place 1 drop into both eyes in the morning and at bedtime.   Yes [provider]  clopidogrel (PLAVIX) 75 MG tablet Take 1 tablet (75 mg total) by mouth daily. 01/14/21  Yes Angiulli, Lavon Paganini, PA-C  losartan (COZAAR) 25 MG tablet Take 25 mg by mouth daily. 09/24/21  Yes [provider]  Multiple Vitamins-Minerals (PRESERVISION AREDS 2 PO) Take 1 tablet by mouth in the morning and at bedtime.   Yes [provider]  Omega-3 Fatty Acids (FISH OIL) 1000 MG CAPS Take  1 capsule (1,000 mg total) by mouth daily. 01/14/21  Yes Angiulli, Lavon Paganini, PA-C  polyethylene glycol powder (MIRALAX) 17 GM/SCOOP powder Take 17 g by mouth daily. 10/29/21  Yes Shaindel Sweeten, Almyra Free, PA-C  rosuvastatin (CRESTOR) 20 MG tablet Take 1 tablet (20 mg total) by mouth daily. 01/14/21  Yes Angiulli, Lavon Paganini, PA-C  sulfamethoxazole-trimethoprim (BACTRIM DS) 800-160 MG tablet Take 1 tablet by mouth 2 (two) times daily for 7 days. 10/29/21 11/05/21 Yes Shelia Magallon, Almyra Free, PA-C      Allergies    Lisinopril, Nsaids, Penicillin v potassium, and Penicillins    Review of Systems   Review of Systems  Constitutional:  Negative for chills and fever.  HENT:  Negative for congestion and sore throat.   Eyes: Negative.   Respiratory:  Negative for chest tightness and shortness of breath.   Cardiovascular:  Negative for chest pain.  Gastrointestinal:  Positive for abdominal pain, diarrhea and nausea. Negative for vomiting.  Genitourinary: Negative.   Musculoskeletal:  Negative for arthralgias, joint swelling and neck pain.  Skin: Negative.  Negative for rash and wound.  Neurological:  Negative for dizziness, weakness, light-headedness, numbness and headaches.  Psychiatric/Behavioral: Negative.      Physical Exam Updated Vital Signs BP (!) 172/88 (BP Location: Left Arm)   Pulse 92   Temp 98.2 F (36.8 C) (Oral)   Resp 17   Ht _0  (1.702 m)   Wt 63.5 kg   SpO2 100%  BMI 21.93 kg/m  Physical Exam Vitals and nursing note reviewed.  Constitutional:      Appearance: She is well-developed.  HENT:     Head: Normocephalic and atraumatic.  Eyes:     Conjunctiva/sclera: Conjunctivae normal.  Cardiovascular:     Rate and Rhythm: Normal rate and regular rhythm.     Heart sounds: Normal heart sounds.  Pulmonary:     Effort: Pulmonary effort is normal.     Breath sounds: Normal breath sounds. No wheezing.  Abdominal:     General: Bowel sounds are decreased.     Palpations: Abdomen is soft.      Tenderness: There is generalized abdominal tenderness and tenderness in the left lower quadrant. There is guarding.  Musculoskeletal:        General: Normal range of motion.     Cervical back: Normal range of motion.  Skin:    General: Skin is warm and dry.  Neurological:     Mental Status: She is alert.     ED Results / Procedures / Treatments   Labs (all labs ordered are listed, but only abnormal results are displayed) Labs Reviewed  COMPREHENSIVE METABOLIC PANEL - Abnormal; Notable for the following components:      Result Value   Glucose, Bld 134 (*)    All other components within normal limits  CBC - Abnormal; Notable for the following components:   WBC 12.1 (*)    RBC 3.82 (*)    All other components within normal limits  URINALYSIS, ROUTINE W REFLEX MICROSCOPIC - Abnormal; Notable for the following components:   APPearance CLOUDY (*)    Specific Gravity, Urine 1.033 (*)    Hgb urine dipstick MODERATE (*)    Protein, ur 100 (*)    Leukocytes,Ua LARGE (*)    RBC / HPF >50 (*)    Bacteria, UA MANY (*)    All other components within normal limits  LIPASE, BLOOD    EKG None  Radiology CT ABDOMEN PELVIS W CONTRAST  Result Date: 10/29/2021 CLINICAL DATA:  Left lower quadrant pain. EXAM: CT ABDOMEN AND PELVIS WITH CONTRAST TECHNIQUE: Multidetector CT imaging of the abdomen and pelvis was performed using the standard protocol following bolus administration of intravenous contrast. RADIATION DOSE REDUCTION: This exam was performed according to the departmental dose-optimization program which includes automated exposure control, adjustment of the mA and/or kV according to patient size and/or use of iterative reconstruction technique. CONTRAST:  155mL OMNIPAQUE IOHEXOL 300 MG/ML  SOLN COMPARISON:  None Available. FINDINGS: Lower chest: No acute abnormality. Hepatobiliary: No focal liver abnormality is seen. Status post cholecystectomy. The proximal common bile duct measures 15 mm  in diameter. Mild central intrahepatic biliary dilatation is also noted. Pancreas: Unremarkable. No pancreatic ductal dilatation or surrounding inflammatory changes. Spleen: Normal in size without focal abnormality. Adrenals/Urinary Tract: Adrenal glands are unremarkable. Kidneys are normal in size, without renal calculi or hydronephrosis. Bilateral simple renal cysts are seen. The largest is noted within the anterolateral aspect of the mid right kidney and measures 1.4 cm in diameter. The urinary bladder is moderately distended and otherwise unremarkable. Stomach/Bowel: Stomach is within normal limits. The appendix is not clearly identified. A large amount of retained stool is seen within the distal sigmoid colon and rectum. Subsequent dilatation of the distal sigmoid colon is noted. No evidence of bowel wall thickening, distention, or inflammatory changes within the remaining visualized portions of large and small bowel. Vascular/Lymphatic: Aortic atherosclerosis. No enlarged abdominal or pelvic lymph nodes.  Reproductive: Status post hysterectomy. No adnexal masses. Other: No abdominal wall hernia or abnormality. No abdominopelvic ascites. Musculoskeletal: Degenerative changes seen throughout the lumbar spine, most prominent at the level of L5-S1. IMPRESSION: 1. Large amount of impacted stool within the distal sigmoid colon and rectum. 2. Status post cholecystectomy. 3. Bilateral simple renal cysts (Bosniak 1). No additional follow-up or imaging is recommended. This recommendation follows ACR consensus guidelines: Management of the Incidental Renal Mass on CT: A White Paper of the ACR Incidental Findings Committee. J Am Coll Radiol [phone]. 4. Aortic atherosclerosis. 5. Degenerative changes throughout the lumbar spine, most prominent at the level of L5-S1. Aortic Atherosclerosis (ICD10-I70.0). Electronically Signed   By: Virgina Norfolk M.D.   On: 10/29/2021 17:59    Procedures Procedures     Medications Ordered in ED Medications  ondansetron (ZOFRAN) injection 4 mg (4 mg Intravenous Given 10/29/21 1653)  morphine (PF) 4 MG/ML injection 4 mg (4 mg Intravenous Given 10/29/21 1653)  iohexol (OMNIPAQUE) 300 MG/ML solution 100 mL (100 mLs Intravenous Contrast Given 10/29/21 1733)  bisacodyl (DULCOLAX) suppository 10 mg (10 mg Rectal Given 10/29/21 1820)  mineral oil enema 1 enema (1 enema Rectal Given 10/29/21 2012)  sulfamethoxazole-trimethoprim (BACTRIM DS) 800-160 MG per tablet 1 tablet (1 tablet Oral Given 10/29/21 2117)  sodium phosphate (FLEET) 7-19 GM/118ML enema 1 enema (1 enema Rectal Given 10/29/21 2202)    ED Course/ Medical Decision Making/ A&P                           Medical Decision Making Patient with significant constipation and impaction.  An initial attempt to digitally disimpact was not beneficial and patient did not tolerate this procedure very well.  She was given a Dulcolax suppository.  After this was allowed to sit, a mineral oil enema was attempted, however patient was sitting on the toilet during this attempt and she was unable to retain the liquid.  We next tried a fleets enema with her lying in decubitus position.  She was able to retain this after which she was able to have a very large bowel movement with significant improvement in her symptoms.  Incidentally she does appear to have a UTI although she does not have complaint of dysuria.  She is started on Bactrim.  So prescribed MiraLAX.  Amount and/or Complexity of Data Reviewed Labs: ordered.    Details: Blood work including CBC, c-Met and lipase are unremarkable markable, however she does have an elevated WBC count at 12.1.  Her urinalysis is positive for infection including many bacteria and a large amount leukocytes, RBCs also present. Radiology: ordered.    Details: CT reviewed, discussed with patient and daughter at bedside.  Agree with findings.  Risk OTC drugs. Prescription drug  management.           Final Clinical Impression(s) / ED Diagnoses Final diagnoses:  Constipation, unspecified constipation type  Acute cystitis with hematuria    Rx / DC Orders ED Discharge Orders          Ordered    sulfamethoxazole-trimethoprim (BACTRIM DS) 800-160 MG tablet  2 times daily        10/29/21 2303    polyethylene glycol powder (MIRALAX) 17 GM/SCOOP powder  Daily        10/29/21 2303              Evalee Jefferson, PA-C 10/29/21 2309    Hayden Rasmussen, MD 10/30/21 1123

## 2021-10-29 NOTE — ED Notes (Signed)
Pt had a successful bowel movement

## 2021-10-29 NOTE — ED Notes (Signed)
Pt tolerated enema well. Pt received enema while lying on their side. Fecal matter was felt just inside the rectum.

## 2021-10-29 NOTE — ED Notes (Signed)
Pt unable to given urine sample at current time.

## 2021-10-29 NOTE — ED Notes (Signed)
Pt has gone to the bathroom 2-3 times with no successful bowel movement, just "smears"

## 2021-10-29 NOTE — ED Notes (Signed)
Pt ambulated to the bathroom with family assist

## 2021-11-28 ENCOUNTER — Ambulatory Visit (HOSPITAL_COMMUNITY): Payer: TRICARE For Life (TFL)

## 2021-12-04 ENCOUNTER — Ambulatory Visit (HOSPITAL_COMMUNITY)
Admission: RE | Admit: 2021-12-04 | Discharge: 2021-12-04 | Disposition: A | Discharge status: Home / Self Care | Payer: Medicare Other | Source: Ambulatory Visit | Attending: Family Medicine | Admitting: Family Medicine

## 2021-12-04 DIAGNOSIS — Z1231 Encounter for screening mammogram for malignant neoplasm of breast: Secondary | ICD-10-CM | POA: Insufficient documentation

## 2022-02-18 DIAGNOSIS — K644 Residual hemorrhoidal skin tags: Secondary | ICD-10-CM | POA: Diagnosis not present

## 2022-02-18 DIAGNOSIS — K648 Other hemorrhoids: Secondary | ICD-10-CM | POA: Diagnosis not present

## 2022-02-18 DIAGNOSIS — Z86010 Personal history of colonic polyps: Secondary | ICD-10-CM | POA: Diagnosis not present

## 2022-02-18 DIAGNOSIS — D122 Benign neoplasm of ascending colon: Secondary | ICD-10-CM | POA: Diagnosis not present

## 2022-02-18 DIAGNOSIS — D123 Benign neoplasm of transverse colon: Secondary | ICD-10-CM | POA: Diagnosis not present

## 2022-02-20 DIAGNOSIS — D123 Benign neoplasm of transverse colon: Secondary | ICD-10-CM | POA: Diagnosis not present

## 2022-03-03 DIAGNOSIS — I10 Essential (primary) hypertension: Secondary | ICD-10-CM | POA: Diagnosis not present

## 2022-03-03 DIAGNOSIS — I679 Cerebrovascular disease, unspecified: Secondary | ICD-10-CM | POA: Diagnosis not present

## 2022-03-03 DIAGNOSIS — L659 Nonscarring hair loss, unspecified: Secondary | ICD-10-CM | POA: Diagnosis not present

## 2022-03-03 DIAGNOSIS — I700 Atherosclerosis of aorta: Secondary | ICD-10-CM | POA: Diagnosis not present

## 2022-03-03 DIAGNOSIS — E785 Hyperlipidemia, unspecified: Secondary | ICD-10-CM | POA: Diagnosis not present

## 2022-03-03 LAB — TSH: TSH: 2.8 (ref 0.41–5.90)

## 2022-03-03 LAB — COMPREHENSIVE METABOLIC PANEL
Calcium: 9.8
EGFR: 92

## 2022-04-04 DIAGNOSIS — R58 Hemorrhage, not elsewhere classified: Secondary | ICD-10-CM | POA: Diagnosis not present

## 2022-04-04 DIAGNOSIS — M79605 Pain in left leg: Secondary | ICD-10-CM | POA: Diagnosis not present

## 2022-04-04 DIAGNOSIS — M791 Myalgia, unspecified site: Secondary | ICD-10-CM | POA: Diagnosis not present

## 2022-04-04 LAB — COMPREHENSIVE METABOLIC PANEL
Calcium: 10.2
EGFR: 85

## 2022-04-04 LAB — PROTIME-INR: INR: 1 (ref 0.80–1.20)

## 2022-04-24 DIAGNOSIS — D72819 Decreased white blood cell count, unspecified: Secondary | ICD-10-CM | POA: Diagnosis not present

## 2022-04-24 DIAGNOSIS — E875 Hyperkalemia: Secondary | ICD-10-CM | POA: Diagnosis not present

## 2022-04-24 DIAGNOSIS — R748 Abnormal levels of other serum enzymes: Secondary | ICD-10-CM | POA: Diagnosis not present

## 2022-08-26 ENCOUNTER — Other Ambulatory Visit (HOSPITAL_COMMUNITY): Payer: Self-pay | Admitting: Family Medicine

## 2022-08-26 ENCOUNTER — Encounter (HOSPITAL_COMMUNITY): Payer: Self-pay | Admitting: Family Medicine

## 2022-08-26 DIAGNOSIS — E7800 Pure hypercholesterolemia, unspecified: Secondary | ICD-10-CM | POA: Diagnosis not present

## 2022-08-26 DIAGNOSIS — E2839 Other primary ovarian failure: Secondary | ICD-10-CM

## 2022-08-26 DIAGNOSIS — I69959 Hemiplegia and hemiparesis following unspecified cerebrovascular disease affecting unspecified side: Secondary | ICD-10-CM | POA: Diagnosis not present

## 2022-08-26 DIAGNOSIS — Z1231 Encounter for screening mammogram for malignant neoplasm of breast: Secondary | ICD-10-CM | POA: Diagnosis not present

## 2022-08-26 DIAGNOSIS — I679 Cerebrovascular disease, unspecified: Secondary | ICD-10-CM | POA: Diagnosis not present

## 2022-08-26 DIAGNOSIS — I700 Atherosclerosis of aorta: Secondary | ICD-10-CM | POA: Diagnosis not present

## 2022-08-26 DIAGNOSIS — M62838 Other muscle spasm: Secondary | ICD-10-CM | POA: Diagnosis not present

## 2022-08-26 DIAGNOSIS — I10 Essential (primary) hypertension: Secondary | ICD-10-CM | POA: Diagnosis not present

## 2022-08-26 DIAGNOSIS — Z0000 Encounter for general adult medical examination without abnormal findings: Secondary | ICD-10-CM | POA: Diagnosis not present

## 2022-10-01 DIAGNOSIS — E875 Hyperkalemia: Secondary | ICD-10-CM | POA: Diagnosis not present

## 2022-10-01 DIAGNOSIS — R748 Abnormal levels of other serum enzymes: Secondary | ICD-10-CM | POA: Diagnosis not present

## 2022-10-29 DIAGNOSIS — R350 Frequency of micturition: Secondary | ICD-10-CM | POA: Diagnosis not present

## 2022-10-29 DIAGNOSIS — Z6824 Body mass index (BMI) 24.0-24.9, adult: Secondary | ICD-10-CM | POA: Diagnosis not present

## 2022-12-10 ENCOUNTER — Ambulatory Visit (HOSPITAL_COMMUNITY)
Admission: RE | Admit: 2022-12-10 | Discharge: 2022-12-10 | Disposition: A | Discharge status: Home / Self Care | Payer: 59 | Source: Ambulatory Visit | Attending: Family Medicine | Admitting: Family Medicine

## 2022-12-10 ENCOUNTER — Encounter (HOSPITAL_COMMUNITY): Payer: Self-pay

## 2022-12-10 DIAGNOSIS — M8589 Other specified disorders of bone density and structure, multiple sites: Secondary | ICD-10-CM | POA: Diagnosis not present

## 2022-12-10 DIAGNOSIS — E2839 Other primary ovarian failure: Secondary | ICD-10-CM

## 2022-12-10 DIAGNOSIS — Z1231 Encounter for screening mammogram for malignant neoplasm of breast: Secondary | ICD-10-CM | POA: Insufficient documentation

## 2023-03-05 DIAGNOSIS — I69959 Hemiplegia and hemiparesis following unspecified cerebrovascular disease affecting unspecified side: Secondary | ICD-10-CM | POA: Diagnosis not present

## 2023-03-05 DIAGNOSIS — I679 Cerebrovascular disease, unspecified: Secondary | ICD-10-CM | POA: Diagnosis not present

## 2023-03-05 DIAGNOSIS — R748 Abnormal levels of other serum enzymes: Secondary | ICD-10-CM | POA: Diagnosis not present

## 2023-03-05 DIAGNOSIS — I69320 Aphasia following cerebral infarction: Secondary | ICD-10-CM | POA: Diagnosis not present

## 2023-03-05 DIAGNOSIS — I10 Essential (primary) hypertension: Secondary | ICD-10-CM | POA: Diagnosis not present

## 2023-03-05 DIAGNOSIS — I700 Atherosclerosis of aorta: Secondary | ICD-10-CM | POA: Diagnosis not present

## 2023-03-05 DIAGNOSIS — E785 Hyperlipidemia, unspecified: Secondary | ICD-10-CM | POA: Diagnosis not present

## 2023-03-26 ENCOUNTER — Encounter: Payer: Self-pay | Admitting: Internal Medicine

## 2023-03-26 ENCOUNTER — Ambulatory Visit: Payer: 59 | Attending: Internal Medicine | Admitting: Internal Medicine

## 2023-03-26 VITALS — BP 155/74 | HR 69 | Resp 14 | Ht 69.0 in | Wt 148.0 lb

## 2023-03-26 DIAGNOSIS — G8330 Monoplegia, unspecified affecting unspecified side: Secondary | ICD-10-CM | POA: Diagnosis not present

## 2023-03-26 DIAGNOSIS — R252 Cramp and spasm: Secondary | ICD-10-CM

## 2023-03-26 DIAGNOSIS — I63512 Cerebral infarction due to unspecified occlusion or stenosis of left middle cerebral artery: Secondary | ICD-10-CM

## 2023-03-26 DIAGNOSIS — I63511 Cerebral infarction due to unspecified occlusion or stenosis of right middle cerebral artery: Secondary | ICD-10-CM | POA: Diagnosis not present

## 2023-03-26 DIAGNOSIS — R253 Fasciculation: Secondary | ICD-10-CM | POA: Diagnosis not present

## 2023-03-26 DIAGNOSIS — R748 Abnormal levels of other serum enzymes: Secondary | ICD-10-CM | POA: Insufficient documentation

## 2023-03-26 NOTE — Progress Notes (Signed)
 Office Visit Note  Patient: Natasha Vazquez             Date of Birth: August 21, 1950           MRN: [id number]             PCP: Merri Brunette, MD Referring: Merri Brunette, MD Visit Date: 03/26/2023  Subjective:  New Patient (Initial Visit)   Discussed the use of AI scribe software for clinical note transcription with the patient, who gave verbal consent to proceed.  History of Present Illness   Natasha Vazquez is a 73 year old female here for evaluation of muscle pains, stiffness, and elevated CK levels since last year. She has a history of stroke in 12/2020 with residual aphasia and monoplegia of the right arm  Muscle pains and stiffness have been present since March of last year, with initial blood tests revealing elevated CK levels. These levels have remained consistently elevated, including in January of this year. 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 She tried reducing statin medication to once a week, CK levels have not significantly decreased.  Symptoms are primarily in the legs, including muscle fasciculations, cramps, and aches, particularly at night. We reviewed recorded video of her left medial thigh demonstrating fasciculations. Similar symptoms occur in the arms, described as 'electrical pulses' or 'twinges.' These symptoms have persisted despite the reduction in statin dosage.  There is occasional unexplained bruising, such as a large bruise on the leg and another on the arm. No skin rashes or other skin changes are present. She is on plavix long term for CVA.  She remains active and continues to walk and uses a recumbent bike for exercise. Cramps and aches occur at night, described as painful. She was previously on magnesium, but discontinued it as it seemed to worsen the cramps.   She is present with her daughter today helping with history and transportation. Limited speech due to expressive aphasia.  Labs reviewed CK 462  02/2023 CK 635 09/2022 CK 460 04/2022  Activities of Daily Living:  Patient reports morning stiffness for 5 minutes.   Patient Reports nocturnal pain.  Difficulty dressing/grooming: Denies Difficulty climbing stairs: Denies Difficulty getting out of chair: Denies Difficulty using hands for taps, buttons, cutlery, and/or writing: Reports  Review of Systems  Constitutional:  Negative for fatigue.  HENT:  Negative for mouth sores and mouth dryness.   Eyes:  Negative for dryness.  Respiratory:  Negative for shortness of breath.   Cardiovascular:  Negative for chest pain and palpitations.  Gastrointestinal:  Negative for blood in stool, constipation and diarrhea.  Endocrine: Negative for increased urination.  Genitourinary:  Negative for involuntary urination.  Musculoskeletal:  Positive for gait problem and morning stiffness. Negative for joint pain, joint pain, joint swelling, myalgias, muscle weakness, muscle tenderness and myalgias.  Skin:  Positive for sensitivity to sunlight. Negative for color change, rash and hair loss.  Allergic/Immunologic: Negative for susceptible to infections.  Neurological:  Positive for dizziness. Negative for headaches.  Hematological:  Negative for swollen glands.  Psychiatric/Behavioral:  Negative for depressed mood and sleep disturbance. The patient is not nervous/anxious.     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 No pertinent family history. Past Surgical History:  Procedure Laterality Date   ABDOMINAL HYSTERECTOMY     CHOLECYSTECTOMY     IR ANGIO INTRA EXTRACRAN SEL INTERNAL CAROTID BILAT MOD SED  09/16/2016   IR ANGIO VERTEBRAL SEL VERTEBRAL BILAT MOD SED  09/16/2016   MASS EXCISION Left 02/10/2020   Procedure: excision of soft tissue mass left upper back;  Surgeon: Diamantina Monks, MD;  Location: [facility];  Service: General;  Laterality: Left;   Social History   Social History Narrative   Not on file    There is no immunization history on file for this patient.   Objective: Vital Signs: BP (!) 155/74 (BP Location: Left Arm, Patient Position: Sitting, Cuff Size: Normal)   Pulse 69   Resp 14   Ht 5\' 9"  (1.753 m)   Wt 148 lb (67.1 kg)   BMI 21.86 kg/m    Physical Exam Eyes:     Conjunctiva/sclera: Conjunctivae normal.  Cardiovascular:     Rate and Rhythm: Normal rate and regular rhythm.     Heart sounds: Murmur heard.  Pulmonary:     Effort: Pulmonary effort is normal.     Breath sounds: Normal breath sounds.  Lymphadenopathy:     Cervical: No cervical adenopathy.  Skin:    General: Skin is warm and dry.  Neurological:     Mental Status: She is alert.     Comments: Right arm monoplegia Expressive aphasia Decreased right ankle dorsiflexion Strength grossly intact throughout other extremities      Musculoskeletal Exam:  Right shoulder limited in active and passive ROM abduction and rotation, no  palpable swelling Postsurgical changes overlying left scapula Right elbow wrist and fingers with partial contracture, weakness, trace swelling probably edema, left hand ROM is normal Midline low back tenderness right above iliac crest Hip normal internal and external rotation without pain, no tenderness to lateral hip palpation Knees full ROM no tenderness or swelling Right ankle dorsiflexion limited ROM also resisted by clonus during passive dorsiflexion Negative for MTP squeeze tenderness or swelling, 1st MTP bunions and bony changes   Investigation: No additional findings.  Imaging: No results found.  Recent Labs: Lab Results  Component Value Date   WBC 12.1 (H) 10/29/2021   HGB 12.4 10/29/2021   PLT 267 10/29/2021   NA 136 10/29/2021   K 4.1 10/29/2021   CL 102 10/29/2021   CO2 23 10/29/2021   GLUCOSE 134 (H) 10/29/2021   BUN 13 10/29/2021   CREATININE 0.72 10/29/2021   BILITOT 0.8 10/29/2021   ALKPHOS 122 10/29/2021   AST 40 10/29/2021   ALT 31 10/29/2021   PROT 8.0 10/29/2021   ALBUMIN 4.6 10/29/2021   CALCIUM 9.6 10/29/2021  GFRAA >60 09/16/2016    Speciality Comments: No specialty comments available.  Procedures:  No procedures performed Allergies: Lisinopril, Nsaids, Penicillin v potassium, and Penicillins   Assessment / Plan:     Visit Diagnoses: Elevated CK - Plan: Myositis Specific II Antibodies Panel, 3-Hydroxy-3-Methylglutaryl-Coenzyme A Reductase (HMGCR) AB (IgG), Aldolase Persistent elevation of CK levels despite reduction of statin to once weekly. Presence of muscle fasciculations, cramping, and stiffness, particularly in the legs. No history of high CK levels prior to last year. -Order additional blood tests including myositis antibody markers and aldolase to further evaluate the cause of elevated CK levels. 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 Right leg also weaker than the left. -Continue current management strategies including stationary bike exercises for strength.  Fasciculations of muscle Leg cramps Noted tightness in lower leg muscles, particularly on the right side, with associated cramping and fasciculations. Symptoms appear to be worse at night. -Provided patient with range of motion exercises for the legs especially ankle/calf    Orders: Orders Placed This Encounter  Procedures   Myositis Specific II Antibodies Panel   3-Hydroxy-3-Methylglutaryl-Coenzyme A Reductase (HMGCR) AB (IgG)   Aldolase   No orders of the defined types were placed in this encounter.    Follow-Up Instructions: No follow-ups on file.   Fuller Plan, MD  Note - This record has been created using [employer].  Chart creation errors have been sought, but may not always  have been located. Such creation errors do not reflect on  the standard of medical care.

## 2023-03-26 NOTE — Patient Instructions (Addendum)
 I am checking for evidence of muscle inflammation particularly if statin related.  I also recommend adding one or more of the attached stretching exercises to help with cramping and spasticity, along with your regular leg exercises.

## 2023-04-01 LAB — MYOSITIS SPECIFIC II ANTIBODIES PANEL
EJ AB: <11 SI (ref <11)
JO-1 AB: <11 SI (ref <11)
MDA-5 AB: <11 SI (ref <11)
MI-2 ALPHA AB: <11 SI (ref <11)
MI-2 BETA AB: <11 SI (ref <11)
NXP-2 AB: <11 SI (ref <11)
OJ AB: <11 SI (ref <11)
PL-12 AB: <11 SI (ref <11)
PL-7 AB: <11 SI (ref <11)
SRP-AB: <11 SI (ref <11)
TIF-1y AB: <11 SI (ref <11)

## 2023-04-01 LAB — ALDOLASE: Aldolase: 5 U/L (ref <=8.1)

## 2023-04-01 LAB — HMGCR AB (IGG): HMGCR AB (IGG): <2 CU (ref <20)

## 2023-06-30 IMAGING — MR MR HEAD W/O CM
11 of 12 series · 40 of 48 positions shown · non-contrast
Comparison: December 25, 2020 CT/CTA.  MRI 08/12/2016.

CLINICAL DATA: Neuro deficit, acute, stroke suspected

EXAM:
MRI HEAD WITHOUT CONTRAST
TECHNIQUE: Multiplanar, multiecho pulse sequences of the brain and surrounding
structures were obtained without intravenous contrast.

[Series 5: DWI · axial · 4.0mm · 0.88mm/px · z∈[-111,+29]mm · 4 of 36 slices shown (1 of 6)]
[im 1/36]
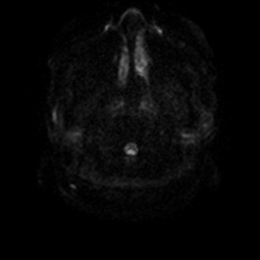
[im 12/36]
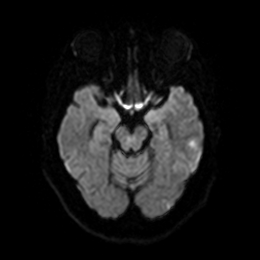
[im 24/36]
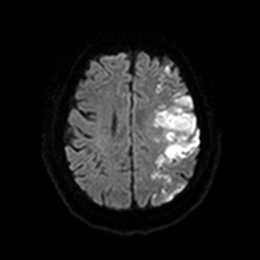
[im 36/36]
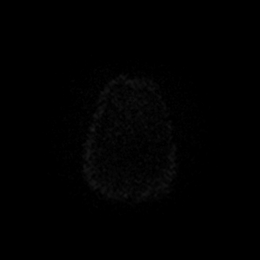

[Series 5: DWI · axial · 4.0mm · 0.88mm/px · z∈[-111,+29]mm · 4 of 36 slices shown (2 of 6)]
[im 1/36]
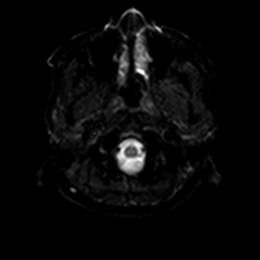
[im 12/36]
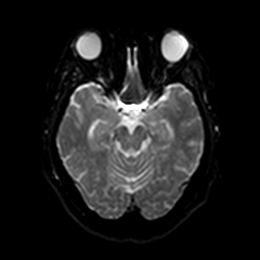
[im 24/36]
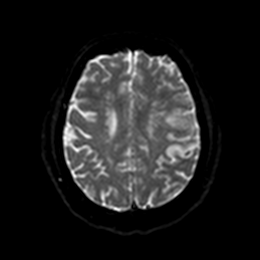
[im 36/36]
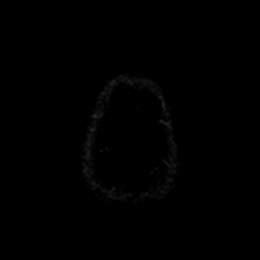

[Series 6: DWI · axial · 4.0mm · 0.88mm/px · z∈[-111,+29]mm · 4 of 36 slices shown (3 of 6)]
[im 1/36]
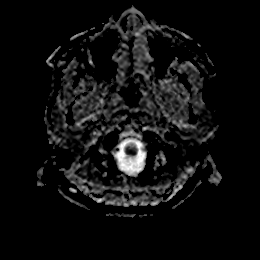
[im 12/36]
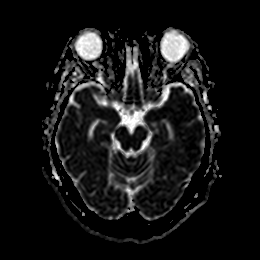
[im 24/36]
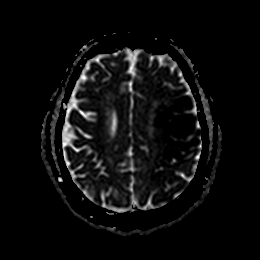
[im 36/36]
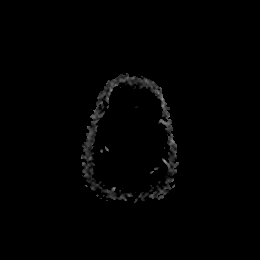

[Series 7: DWI · coronal · 5.0mm · 0.88mm/px · 3 of 28 slices shown (4 of 6)]
[im 1/28]
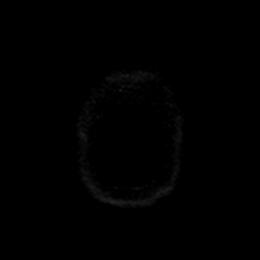
[im 14/28]
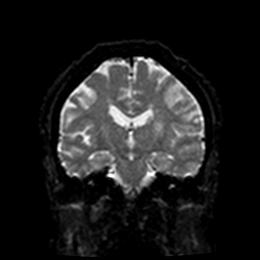
[im 28/28]
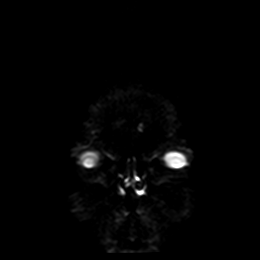

[Series 7: DWI · coronal · 5.0mm · 0.88mm/px · 4 of 28 slices shown (5 of 6)]
[im 1/28]
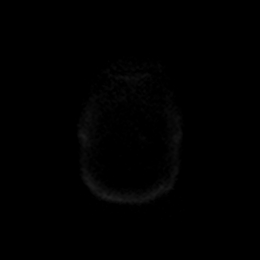
[im 10/28]
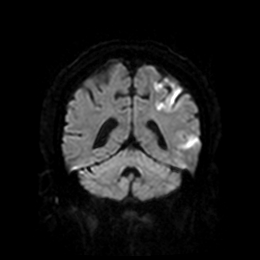
[im 19/28]
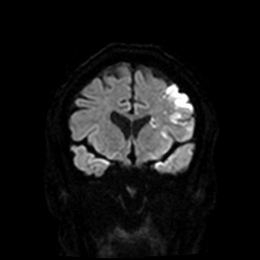
[im 28/28]
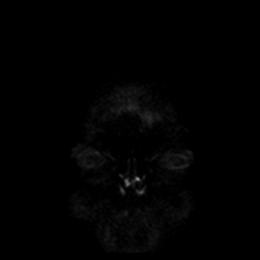

[Series 8: DWI · coronal · 5.0mm · 0.88mm/px · 4 of 28 slices shown (6 of 6)]
[im 1/28]
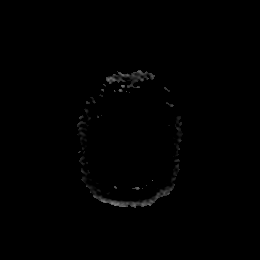
[im 10/28]
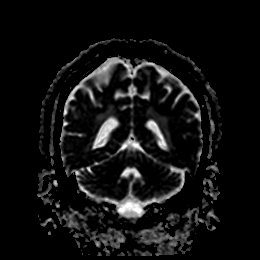
[im 19/28]
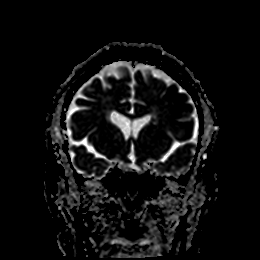
[im 28/28]
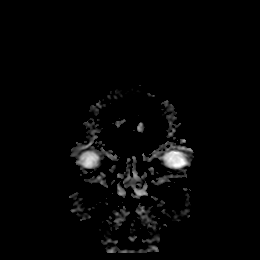

[Series 9: T1 · sagittal · 5.0mm · 0.94mm/px · 3 of 21 slices shown]
[im 1/21]
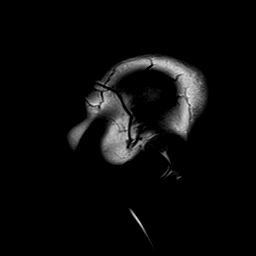
[im 11/21]
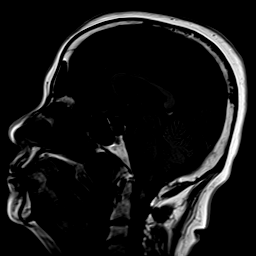
[im 21/21]
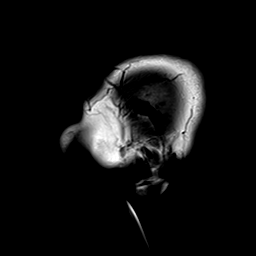

[Series 10: T2 · axial · 5.0mm · 0.72mm/px · z∈[-107,+26]mm · 3 of 20 slices shown (1 of 2)]
[im 1/20]
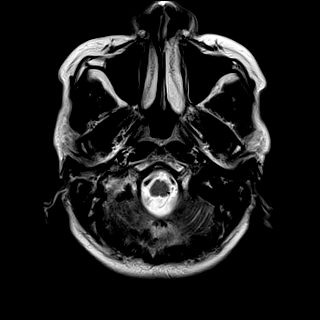
[im 10/20]
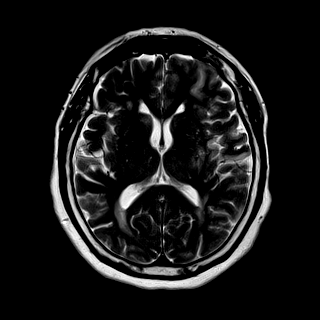
[im 20/20]
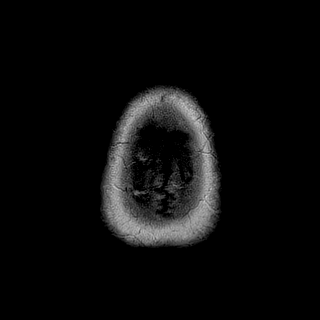

[Series 11: ax hemo · axial · 5.0mm · 0.86mm/px · z∈[-113,+30]mm · 3 of 25 slices shown]
[im 1/25]
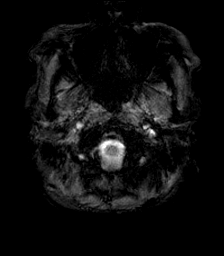
[im 13/25]
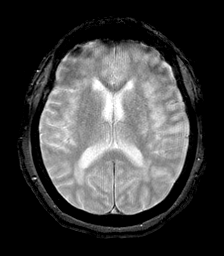
[im 25/25]
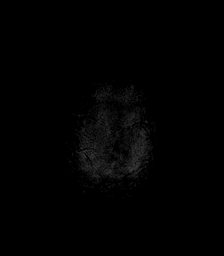

[Series 12: FLAIR · axial · 4.0mm · 0.43mm/px · z∈[-103,+20]mm · 4 of 32 slices shown]
[im 1/32]
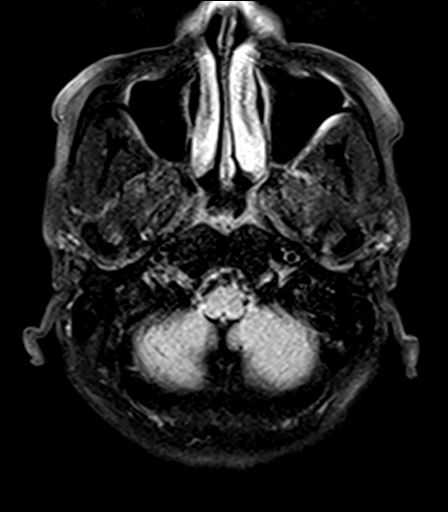
[im 11/32]
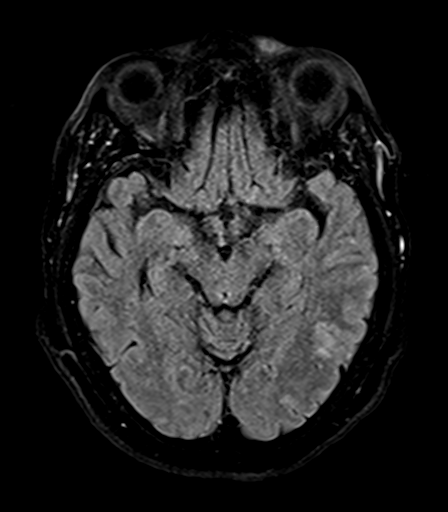
[im 21/32]
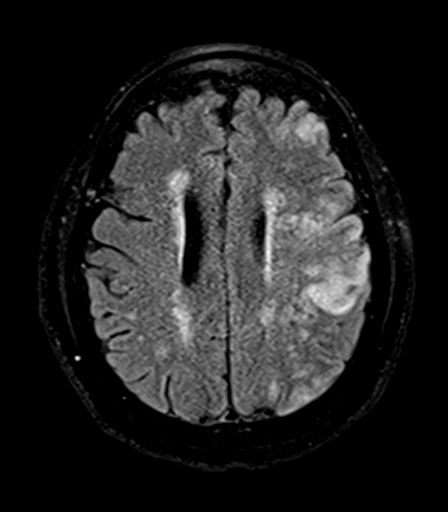
[im 32/32]
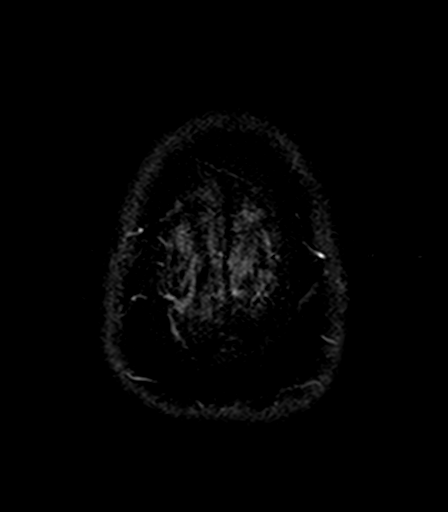

[Series 14: T2 · coronal · 5.0mm · 0.72mm/px · 4 of 28 slices shown (2 of 2)]
[im 1/28]
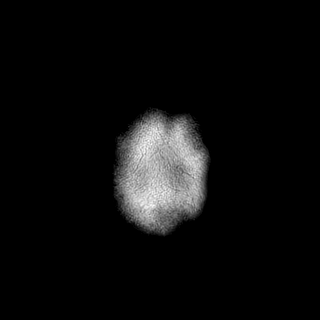
[im 10/28]
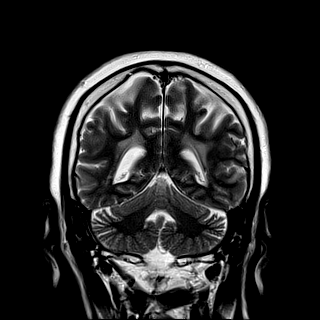
[im 19/28]
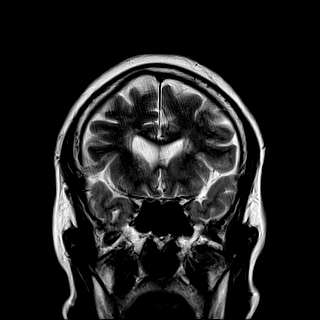
[im 28/28]
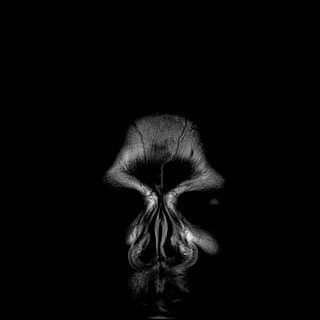

[40 of 48 positions shown; findings below may reference images not displayed]

FINDINGS: Brain: Extensive acute infarcts throughout the left cerebrum,
including left frontal, parietal, and temporal lobes and left basal
ganglia. Associated edema without significant mass effect. No
midline shift. No hydrocephalus, mass lesion, or extra-axial fluid
collection. Additional moderate scattered T2/FLAIR hyperintensities
in the white matter, nonspecific but compatible with chronic
microvascular ischemic disease. Small amount of curvilinear
susceptibility artifact in the left frontal parietal region in the
region of acute infarct, likely petechial hemorrhage. No mass
occupying hemorrhage.

Vascular: Major arterial flow voids are maintained at the skull
base. Further evaluated on CTA from December 25, 2020.

Skull and upper cervical spine: Normal marrow signal.

Sinuses/Orbits: Clear sinuses.  Unremarkable orbits.

Other: No sizable mastoid effusions.
IMPRESSION: 1. Extensive acute infarcts throughout the left cerebrum, including
left frontal, parietal, and temporal lobes and left basal ganglia.
Mild petechial hemorrhage without mass occupying hemorrhagic
transformation. Edema without significant mass effect.
2. Moderate chronic microvascular ischemic disease.

## 2023-10-08 DIAGNOSIS — I10 Essential (primary) hypertension: Secondary | ICD-10-CM | POA: Diagnosis not present

## 2023-10-08 DIAGNOSIS — Z0000 Encounter for general adult medical examination without abnormal findings: Secondary | ICD-10-CM | POA: Diagnosis not present

## 2023-10-08 DIAGNOSIS — Z23 Encounter for immunization: Secondary | ICD-10-CM | POA: Diagnosis not present

## 2023-10-08 DIAGNOSIS — I69320 Aphasia following cerebral infarction: Secondary | ICD-10-CM | POA: Diagnosis not present

## 2023-10-08 DIAGNOSIS — I679 Cerebrovascular disease, unspecified: Secondary | ICD-10-CM | POA: Diagnosis not present

## 2023-10-08 DIAGNOSIS — I69959 Hemiplegia and hemiparesis following unspecified cerebrovascular disease affecting unspecified side: Secondary | ICD-10-CM | POA: Diagnosis not present

## 2023-10-08 DIAGNOSIS — M25562 Pain in left knee: Secondary | ICD-10-CM | POA: Diagnosis not present

## 2023-10-08 DIAGNOSIS — E785 Hyperlipidemia, unspecified: Secondary | ICD-10-CM | POA: Diagnosis not present

## 2023-11-10 ENCOUNTER — Other Ambulatory Visit (HOSPITAL_COMMUNITY): Payer: Self-pay | Admitting: Family Medicine

## 2023-11-10 DIAGNOSIS — Z1231 Encounter for screening mammogram for malignant neoplasm of breast: Secondary | ICD-10-CM

## 2024-01-06 ENCOUNTER — Ambulatory Visit (HOSPITAL_COMMUNITY)
Admission: RE | Admit: 2024-01-06 | Discharge: 2024-01-06 | Disposition: A | Source: Ambulatory Visit | Attending: Family Medicine | Admitting: Family Medicine

## 2024-01-06 DIAGNOSIS — Z1231 Encounter for screening mammogram for malignant neoplasm of breast: Secondary | ICD-10-CM | POA: Insufficient documentation
